# Patient Record
Sex: Female | Born: 1945 | Race: White | Hispanic: No | Marital: Married | Smoking: Never smoker
Health system: Southern US, Community
[De-identification: ages and names within clinical notes are randomized; demographics above are authoritative.]

## PROBLEM LIST (undated history)

## (undated) ENCOUNTER — Ambulatory Visit: Source: Home / Self Care

## (undated) DIAGNOSIS — Q249 Congenital malformation of heart, unspecified: Secondary | ICD-10-CM

## (undated) DIAGNOSIS — R002 Palpitations: Secondary | ICD-10-CM

## (undated) DIAGNOSIS — B019 Varicella without complication: Secondary | ICD-10-CM

## (undated) DIAGNOSIS — Z972 Presence of dental prosthetic device (complete) (partial): Secondary | ICD-10-CM

## (undated) DIAGNOSIS — Z8041 Family history of malignant neoplasm of ovary: Secondary | ICD-10-CM

## (undated) DIAGNOSIS — C50912 Malignant neoplasm of unspecified site of left female breast: Secondary | ICD-10-CM

## (undated) DIAGNOSIS — R0789 Other chest pain: Secondary | ICD-10-CM

## (undated) DIAGNOSIS — M62838 Other muscle spasm: Secondary | ICD-10-CM

## (undated) DIAGNOSIS — J209 Acute bronchitis, unspecified: Secondary | ICD-10-CM

## (undated) DIAGNOSIS — R079 Chest pain, unspecified: Secondary | ICD-10-CM

## (undated) DIAGNOSIS — E669 Obesity, unspecified: Secondary | ICD-10-CM

## (undated) DIAGNOSIS — K08109 Complete loss of teeth, unspecified cause, unspecified class: Secondary | ICD-10-CM

## (undated) DIAGNOSIS — Z973 Presence of spectacles and contact lenses: Secondary | ICD-10-CM

## (undated) DIAGNOSIS — H6980 Other specified disorders of Eustachian tube, unspecified ear: Secondary | ICD-10-CM

## (undated) DIAGNOSIS — E7800 Pure hypercholesterolemia, unspecified: Secondary | ICD-10-CM

## (undated) HISTORY — DX: Congenital malformation of heart, unspecified: Q24.9

## (undated) HISTORY — DX: Palpitations: R00.2

## (undated) HISTORY — DX: Other chest pain: R07.89

## (undated) HISTORY — DX: Varicella without complication: B01.9

## (undated) HISTORY — DX: Obesity, unspecified: E66.9

## (undated) HISTORY — PX: HEMORROIDECTOMY: SUR656

## (undated) HISTORY — DX: Pure hypercholesterolemia, unspecified: E78.00

## (undated) HISTORY — DX: Family history of malignant neoplasm of ovary: Z80.41

## (undated) HISTORY — PX: VAGINAL HYSTERECTOMY: SUR661

---

## 2004-12-23 HISTORY — PX: RETINAL DETACHMENT SURGERY: SHX105

## 2005-02-21 ENCOUNTER — Ambulatory Visit (HOSPITAL_COMMUNITY): Admission: RE | Admit: 2005-02-21 | Discharge: 2005-02-22 | Payer: Self-pay | Admitting: Ophthalmology

## 2009-07-25 ENCOUNTER — Encounter: Admission: RE | Admit: 2009-07-25 | Discharge: 2009-07-25 | Payer: Self-pay | Admitting: Cardiology

## 2011-05-10 NOTE — Op Note (Signed)
NAMEAMYRIA, Cassandra Maldonado            ACCOUNT NO.:  [id number]   MEDICAL RECORD NO.:  [id number]          PATIENT TYPE:  OIB   LOCATION:  2550                         FACILITY:  [facility]   PHYSICIAN:  Beulah Gandy. Ashley Royalty, M.D. DATE OF BIRTH:  [birth year]   DATE OF PROCEDURE:  02/21/2005  DATE OF DISCHARGE:                                 OPERATIVE REPORT   ADMISSION DIAGNOSES:  1.  Retinal break with fluid, left eye.  2.  Rhegmatogenous retinal detachment, right eye.   PROCEDURES:  1.  Retinal laser, left eye.  2.  Scleral buckle, right eye, with retinal laser, right eye.   SURGEON:  Alan Mulder, M.D.   ASSISTANT:  Rosalie Doctor, MA   ANESTHESIA:  General.   DETAILS:  After proper endotracheal anesthesia, attention was carried to the  left eye; 1113 burns were placed around the retinal periphery and around  multiple breaks in the left eye.  The power was between 400 and 800 mW, 1000  microns each and 0.1 seconds each.  Lubricant was placed in the eye; it was  patched shut and covered.  The attention was carried to the right eye, usual  prep and drape, 360 degrees limbal peritomy, isolation of 4 rectus muscles  on 2-0 silk, localization of detachment from 4 o'clock to 7 o'clock, scleral  dissection from 3 o'clock to 9 o'clock to admit a number 279 implant with 1  mm trimmed from the posterior edge, diathermy placed in the bed.  Four  sutures were placed in the scleral flaps, perforation site chosen at 6  o'clock.  A small amount of clear, thick, sticky subretinal fluid came  forth.  The 279 implant was placed against the globe and the scleral flaps  were closed, 240 band placed around the eye with a belt loop at 10, 11 and  2, a 270 sleeve at 2:30 o'clock.  The band was adjusted for a proper  indentation of the globe and the ends trimmed.  The buckle was adjusted and  trimmed.  The sutures were knotted and trimmed.  The conjunctiva was  reapproximated with 7-0 chromic suture.   Paracentesis x1 obtained a closing  tension of 10 with a Barraquer keratometer.  The conjunctiva was reposited  with 7-0 chromic suture.  Polymyxin and gentamicin were irrigated into  Tenon's space.  The indirect ophthalmoscope laser was moved into place; 422  burns were placed on the scleral buckle with a power 400 and 800 milliwatts,  1000 microns each and 0.1 seconds each.  Atropine solution was applied.  Decadron 10 mg was injected into the lower subconjunctival space.  Marcaine  was injected around the globe for postop pain. TobraDex ointment was placed,  there was no Polysporin, but a patch and shield.  The patient was awakened  and taken to recovery in satisfactory condition.     JDM/MEDQ  D:  02/21/2005  T:  02/22/2005  Job:  [id number]

## 2012-07-29 ENCOUNTER — Encounter: Payer: Self-pay | Admitting: Family Medicine

## 2012-07-29 ENCOUNTER — Ambulatory Visit (INDEPENDENT_AMBULATORY_CARE_PROVIDER_SITE_OTHER): Payer: Self-pay | Admitting: Family Medicine

## 2012-07-29 VITALS — BP 145/84 | HR 68 | Temp 98.2°F | Ht 65.0 in | Wt 251.2 lb

## 2012-07-29 DIAGNOSIS — M109 Gout, unspecified: Secondary | ICD-10-CM | POA: Insufficient documentation

## 2012-07-29 DIAGNOSIS — L309 Dermatitis, unspecified: Secondary | ICD-10-CM | POA: Insufficient documentation

## 2012-07-29 DIAGNOSIS — I10 Essential (primary) hypertension: Secondary | ICD-10-CM

## 2012-07-29 DIAGNOSIS — L259 Unspecified contact dermatitis, unspecified cause: Secondary | ICD-10-CM

## 2012-07-29 DIAGNOSIS — G5620 Lesion of ulnar nerve, unspecified upper limb: Secondary | ICD-10-CM | POA: Insufficient documentation

## 2012-07-29 DIAGNOSIS — R0982 Postnasal drip: Secondary | ICD-10-CM

## 2012-07-29 MED ORDER — TRIAMCINOLONE ACETONIDE 0.1 % EX OINT: TOPICAL_OINTMENT | Freq: Two times a day (BID) | CUTANEOUS | Status: DC

## 2012-07-29 MED ORDER — LISINOPRIL-HYDROCHLOROTHIAZIDE 10-12.5 MG PO TABS: 1.0000 | ORAL_TABLET | Freq: Every day | ORAL | Status: DC

## 2012-07-29 MED ORDER — MELOXICAM 15 MG PO TABS: 15.0000 mg | ORAL_TABLET | Freq: Every day | ORAL | Status: DC

## 2012-07-29 NOTE — Assessment & Plan Note (Signed)
New.  Strongly recommended ortho evaluation but pt not able to afford.  Will start daily NSAID to attempt to improve sxs and relieve inflammation.  Will follow.

## 2012-07-29 NOTE — Assessment & Plan Note (Signed)
New.  Pt's rash consistent w/ eczema.  Start steroid ointment prn.

## 2012-07-29 NOTE — Assessment & Plan Note (Signed)
New to provider, chronic for pt.  Previously well controlled on Lisinopril-HCT.  Restart med.  Will get labs at upcoming CPE when pt has insurance.  Reviewed supportive care and red flags that should prompt return.  Pt expressed understanding and is in agreement w/ plan.

## 2012-07-29 NOTE — Progress Notes (Signed)
  Subjective:    Patient ID: Cassandra Maldonado, female    DOB: 1945/12/30, 66 y.o.   MRN: [id number]  HPI New to establish.  Previous MD- An[facility].  HTN- chronic problem, previously on Lisinopril HCT w/ excellent BP control.  Pt has logs dating back to the first of the year.  Previous MD would not refill BP meds as of June and BP has been elevated since then.  At highest, BP was 181/94 and pt had HAs and blurry vision.  No CP, SOB, edema.  AM Cough- intermittent problem, rarely occurs during the day.  + productive.  Will occasionally 'cough until i vomit'.  Denies fevers, chills.  Gout- has required ER visit for this previously.  + family hx- brother on allopurinol.  Pt was previously on 100mg  BID and when she had flare in March, dose was upped to 300mg  BID.  Ran out of meds in July.  'i don't feel like i need it right now'.  R hand pain- mostly localized to middle finger.  Will have constant throbbing, seems more sensitive to cold.  Some numbness.  Some relief w/ ibuprofen.  'right now it almost feels numb'.  Some numbness of adjacent ring finger.  sxs started 2 weeks ago.  Pain will travel into wrist.  No elbow pain.  Skin lesion- medial L ankle, started as small red spot 'it looked like a mosquito bite'.  Had similar places on dorsum of L hand, anterior knees, and medial R ankle- which have all healed w/ use of bacitracin.   Review of Systems     Objective:   Physical Exam  Vitals reviewed. Constitutional: She is oriented to person, place, and time. She appears well-developed and well-nourished. No distress.  HENT:  Head: Normocephalic and atraumatic.  Right Ear: Tympanic membrane normal.  Left Ear: Tympanic membrane normal.  Nose: Mucosal edema and rhinorrhea present. Right sinus exhibits no maxillary sinus tenderness and no frontal sinus tenderness. Left sinus exhibits no maxillary sinus tenderness and no frontal sinus tenderness.  Mouth/Throat: Uvula is midline and mucous membranes are  normal. Posterior oropharyngeal erythema (w/ PND) present. No oropharyngeal exudate.  Eyes: Conjunctivae and EOM are normal. Pupils are equal, round, and reactive to light.  Neck: Normal range of motion. Neck supple. No thyromegaly present.  Cardiovascular: Normal rate, regular rhythm and normal heart sounds.   Pulmonary/Chest: Effort normal and breath sounds normal. No respiratory distress. She has no wheezes. She has no rales.  Abdominal: Soft. Bowel sounds are normal. She exhibits no distension. There is no tenderness. There is no rebound and no guarding.  Musculoskeletal: She exhibits edema (trace LE edema bilaterally).  Lymphadenopathy:    She has no cervical adenopathy.  Neurological: She is alert and oriented to person, place, and time. She has normal reflexes.  Skin: Skin is warm and dry. Rash (well circumscribed lesion on L medial ankle consistent w/ eczema) noted.          Assessment & Plan:

## 2012-07-29 NOTE — Assessment & Plan Note (Signed)
New.  This is cause of pt's AM cough.  Start OTC antihistamine.

## 2012-07-29 NOTE — Patient Instructions (Addendum)
Schedule your complete physical for April Restart the Lisinopril HCT daily Start the Mobic daily for pain and inflammation Use the triamcinolone ointment on the itchy spot twice daily Call with any questions or concerns Welcome!  We're glad to have you!!!

## 2012-07-29 NOTE — Assessment & Plan Note (Signed)
New to provider, chronic for pt.  Not currently on allopurinol.  Pt prefers to hold off at this time since she is sx free.  Will follow and treat prn.

## 2012-08-14 ENCOUNTER — Encounter: Payer: Self-pay | Admitting: Family Medicine

## 2012-08-14 DIAGNOSIS — M25519 Pain in unspecified shoulder: Secondary | ICD-10-CM

## 2012-08-14 NOTE — Telephone Encounter (Signed)
Placed ortho referral in chart, left vm on mobile per DPR and advised someone will call her with the apt date and time, also sent message via my chart

## 2012-08-14 NOTE — Telephone Encounter (Signed)
Please advise 

## 2012-08-17 ENCOUNTER — Encounter: Payer: Self-pay | Admitting: Family Medicine

## 2012-08-17 NOTE — Telephone Encounter (Signed)
Please review note from pt.

## 2012-08-18 NOTE — Telephone Encounter (Signed)
Please note and advise 

## 2013-02-06 ENCOUNTER — Other Ambulatory Visit: Payer: Self-pay

## 2013-03-29 ENCOUNTER — Encounter: Payer: Self-pay | Admitting: Family Medicine

## 2013-03-29 ENCOUNTER — Ambulatory Visit (INDEPENDENT_AMBULATORY_CARE_PROVIDER_SITE_OTHER): Payer: Medicare Other | Admitting: Family Medicine

## 2013-03-29 VITALS — BP 118/80 | HR 70 | Temp 98.1°F | Ht 65.25 in | Wt 250.8 lb

## 2013-03-29 DIAGNOSIS — M25551 Pain in right hip: Secondary | ICD-10-CM | POA: Insufficient documentation

## 2013-03-29 DIAGNOSIS — Z1231 Encounter for screening mammogram for malignant neoplasm of breast: Secondary | ICD-10-CM

## 2013-03-29 DIAGNOSIS — Z0000 Encounter for general adult medical examination without abnormal findings: Secondary | ICD-10-CM

## 2013-03-29 DIAGNOSIS — I10 Essential (primary) hypertension: Secondary | ICD-10-CM

## 2013-03-29 DIAGNOSIS — E2839 Other primary ovarian failure: Secondary | ICD-10-CM

## 2013-03-29 DIAGNOSIS — M109 Gout, unspecified: Secondary | ICD-10-CM

## 2013-03-29 DIAGNOSIS — M25559 Pain in unspecified hip: Secondary | ICD-10-CM

## 2013-03-29 DIAGNOSIS — Z1331 Encounter for screening for depression: Secondary | ICD-10-CM

## 2013-03-29 DIAGNOSIS — Z23 Encounter for immunization: Secondary | ICD-10-CM

## 2013-03-29 DIAGNOSIS — Z1211 Encounter for screening for malignant neoplasm of colon: Secondary | ICD-10-CM

## 2013-03-29 DIAGNOSIS — M25552 Pain in left hip: Secondary | ICD-10-CM

## 2013-03-29 DIAGNOSIS — R1310 Dysphagia, unspecified: Secondary | ICD-10-CM

## 2013-03-29 LAB — CBC WITH DIFFERENTIAL/PLATELET
Basophils Absolute: 0.1 10*3/uL (ref 0.0–0.1)
Eosinophils Relative: 3.1 % (ref 0.0–5.0)
HCT: 37.3 % (ref 36.0–46.0)
Hemoglobin: 12.5 g/dL (ref 12.0–15.0)
Lymphocytes Relative: 26.6 % (ref 12.0–46.0)
Lymphs Abs: 1.9 10*3/uL (ref 0.7–4.0)
MCHC: 33.4 g/dL (ref 30.0–36.0)
MCV: 88.7 fl (ref 78.0–100.0)
Monocytes Absolute: 0.5 10*3/uL (ref 0.1–1.0)
RBC: 4.21 Mil/uL (ref 3.87–5.11)

## 2013-03-29 LAB — LIPID PANEL
LDL Cholesterol: 122 mg/dL — ABNORMAL HIGH (ref 0–99)
VLDL: 33.6 mg/dL (ref 0.0–40.0)

## 2013-03-29 LAB — BASIC METABOLIC PANEL
BUN: 20 mg/dL (ref 6–23)
Calcium: 9.4 mg/dL (ref 8.4–10.5)
Creatinine, Ser: 1.3 mg/dL — ABNORMAL HIGH (ref 0.4–1.2)
GFR: 44.89 mL/min — ABNORMAL LOW (ref >60.00)
Glucose, Bld: 105 mg/dL — ABNORMAL HIGH (ref 70–99)
Potassium: 3.6 mEq/L (ref 3.5–5.1)
Sodium: 138 mEq/L (ref 135–145)

## 2013-03-29 LAB — HEPATIC FUNCTION PANEL
Alkaline Phosphatase: 92 U/L (ref 39–117)
Bilirubin, Direct: 0.1 mg/dL (ref 0.0–0.3)
Total Bilirubin: 0.7 mg/dL (ref 0.3–1.2)

## 2013-03-29 LAB — TSH: TSH: 0.83 u[IU]/mL (ref 0.35–5.50)

## 2013-03-29 MED ORDER — ALLOPURINOL 100 MG PO TABS: 100.0000 mg | ORAL_TABLET | Freq: Two times a day (BID) | ORAL | Status: DC

## 2013-03-29 MED ORDER — NAPROXEN 500 MG PO TABS: 500.0000 mg | ORAL_TABLET | Freq: Two times a day (BID) | ORAL | Status: DC

## 2013-03-29 NOTE — Assessment & Plan Note (Signed)
Chronic problem.  Pt started taking brother's Allopurinol.  Will decrease to 100mg  BID.  Reviewed supportive care and red flags that should prompt return.  Pt expressed understanding and is in agreement w/ plan.

## 2013-03-29 NOTE — Assessment & Plan Note (Signed)
Chronic problem.  Improved today.  Asymptomatic.  No changes.

## 2013-03-29 NOTE — Assessment & Plan Note (Signed)
New.  Start regular NSAIDs.  If no improvement, will need ortho referral.  Pt expressed understanding and is in agreement w/ plan.

## 2013-03-29 NOTE — Patient Instructions (Addendum)
Follow up in 6 months to recheck BP We'll notify you of your lab results and make any changes if needed Someone will call you with your mammo/bone density appts, GI appt Call and schedule your eye exam Start the Naproxen twice daily- take w/ food.  If the hip pain is no better, please call so we can send you to ortho Start the Allopurinol twice daily Call with any questions or concerns [place] Ouch!

## 2013-03-29 NOTE — Assessment & Plan Note (Signed)
Pt's PE WNL w/ exception of obesity.  Pt overdue on all health maintenance- discussed w/ pt, referrals made.  Strongly encouraged eye exam.  EKG done- see document for interpretation.  Check labs.  Anticipatory guidance provided.

## 2013-03-29 NOTE — Assessment & Plan Note (Signed)
New.  Pt reports difficulty swallowing and being able to feel food travel the length of her esophagus.  Will refer to Gi for complete evaluation and tx.  Pt expressed understanding and is in agreement w/ plan.

## 2013-03-29 NOTE — Progress Notes (Signed)
  Subjective:    Patient ID: Cassandra Maldonado, female    DOB: [birth year], 67 y.o.   MRN: [id number]  HPI Here today for CPE.  Risk Factors: HTN- chronic problem, well controlled on Lisinopril HCTZ.  No CP, SOB, HAs, visual changes, edema. Gout- chronic problem, strong family hx.  Restarted Allopurinol after flare in toe.  Now on 300mg  daily.  Would like to decrease med to 100mg  BID. Bilateral hip pain- was occuring intermittently, now nightly.  Painful to lie on.  Described as a burning sensation.  Some relief w/ tylenol.  Stopped taking Mobic in September. Physical Activity: no current exercise Fall Risk: low risk Depression: no current sxs Hearing: normal to conversational tones, whispered voice at 6 ft ADL's: independent Cognitive: normal linear thought process, memory and attention intact Home Safety: safe at home. Height, Weight, BMI, Visual Acuity: see vitals, vision corrected to 20/20 w/ glasses Counseling: no need for paps due to TAH, overdue on mammo, DEXA, colonoscopy (has never had any of these done) Labs Ordered: See A&P Care Plan: See A&P    Review of Systems Patient reports no vision/hearing changes, adenopathy,fever, weight change,  persistant/recurrent hoarseness, chest pain, palpitations, edema, persistant/recurrent cough, hemoptysis, dyspnea (rest/exertional/paroxysmal nocturnal), gastrointestinal bleeding (melena, rectal bleeding), abdominal pain, significant heartburn, bowel changes, GU symptoms (dysuria, hematuria, incontinence), Gyn symptoms (abnormal  bleeding, pain),  syncope, focal weakness, memory loss, numbness & tingling, skin/hair/nail changes, abnormal bruising or bleeding, anxiety, or depression.   + dysphagia    Objective:   Physical Exam  General Appearance:    Alert, cooperative, no distress, appears stated age, obese  Head:    Normocephalic, without obvious abnormality, atraumatic  Eyes:    PERRL, conjunctiva/corneas clear, EOM's intact, fundi   benign, both eyes  Ears:    Normal TM's and external ear canals, both ears  Nose:   Nares normal, septum midline, mucosa normal, no drainage    or sinus tenderness  Throat:   Lips, mucosa, and tongue normal; teeth and gums normal  Neck:   Supple, symmetrical, trachea midline, no adenopathy;    Thyroid: no enlargement/tenderness/nodules  Back:     Symmetric, no curvature, ROM normal, no CVA tenderness  Lungs:     Clear to auscultation bilaterally, respirations unlabored  Chest Wall:    No tenderness or deformity   Heart:    Regular rate and rhythm, S1 and S2 normal, no murmur, rub   or gallop  Breast Exam:    No tenderness, masses, or nipple abnormality  Abdomen:     Soft, non-tender, bowel sounds active all four quadrants,    no masses, no organomegaly  Genitalia:    Deferred due to TAH  Rectal:    Extremities:   Extremities normal, atraumatic, no cyanosis or edema  Pulses:   2+ and symmetric all extremities  Skin:   Skin color, texture, turgor normal, no rashes or lesions  Lymph nodes:   Cervical, supraclavicular, and axillary nodes normal  Neurologic:   CNII-XII intact, normal strength, sensation and reflexes    throughout          Assessment & Plan:

## 2013-03-30 ENCOUNTER — Encounter: Payer: Self-pay | Admitting: Gastroenterology

## 2013-04-16 ENCOUNTER — Encounter: Payer: Self-pay | Admitting: General Practice

## 2013-04-20 ENCOUNTER — Ambulatory Visit: Payer: Medicare Other | Admitting: Gastroenterology

## 2013-07-21 ENCOUNTER — Other Ambulatory Visit: Payer: Self-pay | Admitting: Family Medicine

## 2013-07-26 NOTE — Telephone Encounter (Signed)
Rx sent to the pharmacy by e-script.//AB/CMA 

## 2013-07-28 ENCOUNTER — Other Ambulatory Visit: Payer: Self-pay

## 2013-10-13 ENCOUNTER — Encounter: Payer: Self-pay | Admitting: Family Medicine

## 2013-10-13 ENCOUNTER — Ambulatory Visit (INDEPENDENT_AMBULATORY_CARE_PROVIDER_SITE_OTHER): Payer: Medicare Other | Admitting: Family Medicine

## 2013-10-13 VITALS — BP 116/68 | HR 73 | Temp 98.4°F | Wt 253.0 lb

## 2013-10-13 DIAGNOSIS — R102 Pelvic and perineal pain: Secondary | ICD-10-CM

## 2013-10-13 DIAGNOSIS — R350 Frequency of micturition: Secondary | ICD-10-CM

## 2013-10-13 DIAGNOSIS — N9489 Other specified conditions associated with female genital organs and menstrual cycle: Secondary | ICD-10-CM

## 2013-10-13 LAB — POCT URINALYSIS DIPSTICK
Glucose, UA: NEGATIVE
Ketones, UA: NEGATIVE
Protein, UA: NEGATIVE
pH, UA: 6

## 2013-10-13 MED ORDER — CIPROFLOXACIN HCL 250 MG PO TABS: 250.0000 mg | ORAL_TABLET | Freq: Two times a day (BID) | ORAL | Status: DC

## 2013-10-13 NOTE — Patient Instructions (Signed)
Urinary Tract Infection  Urinary tract infections (UTIs) can develop anywhere along your urinary tract. Your urinary tract is your body's drainage system for removing wastes and extra water. Your urinary tract includes two kidneys, two ureters, a bladder, and a urethra. Your kidneys are a pair of bean-shaped organs. Each kidney is about the size of your fist. They are located below your ribs, one on each side of your spine.  CAUSES  Infections are caused by microbes, which are microscopic organisms, including fungi, viruses, and bacteria. These organisms are so small that they can only be seen through a microscope. Bacteria are the microbes that most commonly cause UTIs.  SYMPTOMS   Symptoms of UTIs may vary by age and gender of the patient and by the location of the infection. Symptoms in young women typically include a frequent and intense urge to urinate and a painful, burning feeling in the bladder or urethra during urination. Older women and men are more likely to be tired, shaky, and weak and have muscle aches and abdominal pain. A fever may mean the infection is in your kidneys. Other symptoms of a kidney infection include pain in your back or sides below the ribs, nausea, and vomiting.  DIAGNOSIS  To diagnose a UTI, your caregiver will ask you about your symptoms. Your caregiver also will ask to provide a urine sample. The urine sample will be tested for bacteria and white blood cells. White blood cells are made by your body to help fight infection.  TREATMENT   Typically, UTIs can be treated with medication. Because most UTIs are caused by a bacterial infection, they usually can be treated with the use of antibiotics. The choice of antibiotic and length of treatment depend on your symptoms and the type of bacteria causing your infection.  HOME CARE INSTRUCTIONS   If you were prescribed antibiotics, take them exactly as your caregiver instructs you. Finish the medication even if you feel better after you  have only taken some of the medication.   Drink enough water and fluids to keep your urine clear or pale yellow.   Avoid caffeine, tea, and carbonated beverages. They tend to irritate your bladder.   Empty your bladder often. Avoid holding urine for long periods of time.   Empty your bladder before and after sexual intercourse.   After a bowel movement, women should cleanse from front to back. Use each tissue only once.  SEEK MEDICAL CARE IF:    You have back pain.   You develop a fever.   Your symptoms do not begin to resolve within 3 days.  SEEK IMMEDIATE MEDICAL CARE IF:    You have severe back pain or lower abdominal pain.   You develop chills.   You have nausea or vomiting.   You have continued burning or discomfort with urination.  MAKE SURE YOU:    Understand these instructions.   Will watch your condition.   Will get help right away if you are not doing well or get worse.  Document Released: 09/18/2005 Document Revised: 06/09/2012 Document Reviewed: 01/17/2012  [employer] Patient Information 2014 [employer].

## 2013-10-13 NOTE — Progress Notes (Signed)
  Subjective:    Cassandra Maldonado is a 67 y.o. female who complains of suprapubic pressure. She has had symptoms for 4 days. Patient also complains of chills. Patient denies back pain, congestion, cough, fever, headache, rhinitis, sorethroat and vaginal discharge. Patient does not have a history of recurrent UTI. Patient does not have a history of pyelonephritis.   The following portions of the patient's history were reviewed and updated as appropriate: allergies, current medications, past family history, past medical history, past social history, past surgical history and problem list.  Review of Systems Pertinent items are noted in HPI.    Objective:    BP 116/68  Pulse 73  Temp(Src) 98.4 F (36.9 C) (Oral)  Wt 253 lb (114.76 kg)  BMI 41.8 kg/m2  SpO2 97% General appearance: alert, cooperative, appears stated age and no distress Abdomen: soft, non-tender; bowel sounds normal; no masses,  no organomegaly  Laboratory:  Urine dipstick: trace for hemoglobin and 2+ for leukocyte esterase.   Micro exam: not done.    Assessment:    dysuria     Plan:    Medications: ciprofloxacin. Maintain adequate hydration. Follow up if symptoms not improving, and as needed.

## 2013-10-15 ENCOUNTER — Telehealth: Payer: Self-pay | Admitting: Family Medicine

## 2013-10-15 LAB — URINE CULTURE: Organism ID, Bacteria: NO GROWTH

## 2013-10-15 NOTE — Telephone Encounter (Signed)
See lab

## 2013-10-15 NOTE — Telephone Encounter (Signed)
Pattient saw Dr. Laury Axon on 10/13/13 and is calling about urine lab results. Please advise.

## 2013-10-18 NOTE — Telephone Encounter (Signed)
Labs have been released to mychart

## 2013-10-24 ENCOUNTER — Other Ambulatory Visit: Payer: Self-pay | Admitting: Family Medicine

## 2013-10-25 NOTE — Telephone Encounter (Signed)
Med filled.  

## 2013-10-28 ENCOUNTER — Other Ambulatory Visit: Payer: Self-pay

## 2013-11-24 ENCOUNTER — Other Ambulatory Visit: Payer: Self-pay | Admitting: Family Medicine

## 2013-11-24 NOTE — Telephone Encounter (Signed)
Pt needs an appt for further  refills 

## 2013-12-24 ENCOUNTER — Telehealth: Payer: Self-pay | Admitting: Family Medicine

## 2013-12-24 MED ORDER — ALLOPURINOL 100 MG PO TABS: ORAL_TABLET | ORAL | Status: DC

## 2013-12-24 NOTE — Telephone Encounter (Signed)
I filled this med for 90 days. However pt has not been seen by tabori since April of last year. Can she be scheduled for a follow up BP appt.

## 2013-12-24 NOTE — Telephone Encounter (Signed)
Patient is calling to request a refill on her allopurinol (ZYLOPRIM) 100 MG rx. She wants to know if a 90-day supply can be rx'd so that she does not have to call every 30 days. Please advise.

## 2014-01-12 ENCOUNTER — Other Ambulatory Visit: Payer: Self-pay | Admitting: Family Medicine

## 2014-01-12 NOTE — Telephone Encounter (Signed)
Med filled.  

## 2014-01-14 ENCOUNTER — Other Ambulatory Visit: Payer: Self-pay | Admitting: Family Medicine

## 2014-01-14 NOTE — Telephone Encounter (Signed)
Med filled, only #30 pt needs follow up appt for BP.

## 2014-02-23 ENCOUNTER — Telehealth: Payer: Self-pay | Admitting: Family Medicine

## 2014-02-23 ENCOUNTER — Ambulatory Visit (INDEPENDENT_AMBULATORY_CARE_PROVIDER_SITE_OTHER): Payer: Medicare Other | Admitting: Family Medicine

## 2014-02-23 ENCOUNTER — Encounter: Payer: Self-pay | Admitting: Family Medicine

## 2014-02-23 VITALS — BP 120/76 | HR 75 | Temp 98.2°F | Resp 16 | Wt 250.5 lb

## 2014-02-23 DIAGNOSIS — I10 Essential (primary) hypertension: Secondary | ICD-10-CM

## 2014-02-23 DIAGNOSIS — M6530 Trigger finger, unspecified finger: Secondary | ICD-10-CM | POA: Insufficient documentation

## 2014-02-23 LAB — BASIC METABOLIC PANEL
BUN: 20 mg/dL (ref 6–23)
CO2: 28 mEq/L (ref 19–32)
CREATININE: 1.2 mg/dL (ref 0.4–1.2)
Calcium: 9.3 mg/dL (ref 8.4–10.5)
Chloride: 102 mEq/L (ref 96–112)
GFR: 49.21 mL/min — ABNORMAL LOW (ref >60.00)
GLUCOSE: 77 mg/dL (ref 70–99)
Potassium: 3.8 mEq/L (ref 3.5–5.1)
Sodium: 138 mEq/L (ref 135–145)

## 2014-02-23 LAB — HEPATIC FUNCTION PANEL
ALK PHOS: 94 U/L (ref 39–117)
ALT: 23 U/L (ref 0–35)
AST: 25 U/L (ref 0–37)
Albumin: 3.7 g/dL (ref 3.5–5.2)
BILIRUBIN DIRECT: 0.1 mg/dL (ref 0.0–0.3)
TOTAL PROTEIN: 7.6 g/dL (ref 6.0–8.3)
Total Bilirubin: 0.8 mg/dL (ref 0.3–1.2)

## 2014-02-23 LAB — LIPID PANEL
Cholesterol: 171 mg/dL (ref 0–200)
HDL: 27.7 mg/dL — AB (ref >39.00)
LDL CALC: 110 mg/dL — AB (ref 0–99)
Total CHOL/HDL Ratio: 6
Triglycerides: 166 mg/dL — ABNORMAL HIGH (ref 0.0–149.0)
VLDL: 33.2 mg/dL (ref 0.0–40.0)

## 2014-02-23 LAB — TSH: TSH: 0.57 u[IU]/mL (ref 0.35–5.50)

## 2014-02-23 MED ORDER — NAPROXEN 500 MG PO TABS: 500.0000 mg | ORAL_TABLET | Freq: Two times a day (BID) | ORAL | Status: DC

## 2014-02-23 NOTE — Progress Notes (Signed)
Pre visit review using our clinic review tool, if applicable. No additional management support is needed unless otherwise documented below in the visit note. 

## 2014-02-23 NOTE — Assessment & Plan Note (Signed)
Chronic problem.  Tolerating med but having leg cramps.  Check BMP to ensure normal K+ and Cr.  Encouraged increased fluids.  Will follow.

## 2014-02-23 NOTE — Patient Instructions (Signed)
Schedule your complete physical in 4-6 months Start the Naproxen twice daily- take w/ food- for inflammation If no improvement in your hand, please call for a referral to a hand specialist We'll notify you of your lab results and make any changes if needed Call with any questions or concerns [place]!

## 2014-02-23 NOTE — Telephone Encounter (Signed)
Relevant patient education assigned to patient using Emmi. ° °

## 2014-02-23 NOTE — Assessment & Plan Note (Signed)
New.  Offered referral to hand specialist but pt declined.  Prefers to start NSAIDs and monitor for sxs improvement.  Will refer if no improvement on Naproxen.  Pt expressed understanding and is in agreement w/ plan.

## 2014-02-23 NOTE — Progress Notes (Signed)
   Subjective:    Patient ID: Cassandra Maldonado, female    DOB: 06-Sep-1946, 68 y.o.   MRN: [id number]  HPI HTN- chronic problem, on Lisinopril HCTZ daily.  Denies CP, SOB, HAs, visual changes, edema.  + leg cramps.  L ring finger trigger finger- sxs started 'months ago'.  Unable to make a fist.  Pain will wake her from sleep.  Interfering w/ grip  Review of Systems For ROS see HPI     Objective:   Physical Exam  Vitals reviewed. Constitutional: She is oriented to person, place, and time. She appears well-developed and well-nourished. No distress.  HENT:  Head: Normocephalic and atraumatic.  Eyes: Conjunctivae and EOM are normal. Pupils are equal, round, and reactive to light.  Neck: Normal range of motion. Neck supple. No thyromegaly present.  Cardiovascular: Normal rate, regular rhythm, normal heart sounds and intact distal pulses.   No murmur heard. Pulmonary/Chest: Effort normal and breath sounds normal. No respiratory distress.  Abdominal: Soft. She exhibits no distension. There is no tenderness.  Musculoskeletal: She exhibits tenderness ( TTP over L 4th metacarpal on palmar surface). She exhibits no edema.  Lymphadenopathy:    She has no cervical adenopathy.  Neurological: She is alert and oriented to person, place, and time.  Skin: Skin is warm and dry.  Psychiatric: She has a normal mood and affect. Her behavior is normal.          Assessment & Plan:

## 2014-02-24 ENCOUNTER — Other Ambulatory Visit: Payer: Self-pay | Admitting: Family Medicine

## 2014-02-24 NOTE — Telephone Encounter (Signed)
Med filled.  

## 2014-03-28 ENCOUNTER — Other Ambulatory Visit: Payer: Self-pay | Admitting: Family Medicine

## 2014-03-28 NOTE — Telephone Encounter (Signed)
Med filled.  

## 2014-06-01 ENCOUNTER — Encounter: Payer: Self-pay | Admitting: Internal Medicine

## 2014-06-01 ENCOUNTER — Ambulatory Visit (INDEPENDENT_AMBULATORY_CARE_PROVIDER_SITE_OTHER): Payer: Medicare Other | Admitting: Internal Medicine

## 2014-06-01 VITALS — BP 110/72 | HR 68 | Temp 98.2°F | Wt 249.0 lb

## 2014-06-01 DIAGNOSIS — M2550 Pain in unspecified joint: Secondary | ICD-10-CM

## 2014-06-01 DIAGNOSIS — L239 Allergic contact dermatitis, unspecified cause: Secondary | ICD-10-CM

## 2014-06-01 DIAGNOSIS — L259 Unspecified contact dermatitis, unspecified cause: Secondary | ICD-10-CM

## 2014-06-01 MED ORDER — FLUTICASONE PROPIONATE 0.005 % EX OINT: 1.0000 "application " | TOPICAL_OINTMENT | Freq: Three times a day (TID) | CUTANEOUS | Status: DC

## 2014-06-01 MED ORDER — PREDNISONE 10 MG PO TABS: 10.0000 mg | ORAL_TABLET | Freq: Every day | ORAL | Status: DC

## 2014-06-01 NOTE — Patient Instructions (Signed)
Apply the ointment 3 times a day x 10 days  Take benadryl as needed for itching If not improving within the next few days, start prednisone by mouth Call if no better within 10 days Avoid the new bracelet

## 2014-06-01 NOTE — Progress Notes (Signed)
Subjective:    Patient ID: Cassandra Maldonado, female    DOB: [birth year], 68 y.o.   MRN: [id number]  DOS:  06/01/2014 Type of  Visit: acute History: 3 weeks ago got a new bracelet, used on the L wrist, shortly After she developed itching and a  blistery rash. Switch that bracelet to the right wrist and shortly after started to itch there as well. Also complained about joint aches on and off in the knees and hands (that's why she got the bracelet).   ROS No fever or chills wrist itself is not swollen or red  Past Medical History  Diagnosis Date  . SOB (shortness of breath)   . Chest pressure   . Palpitations   . Hypertension   . Hypercholesterolemia   . Obesity   . Sleep apnea   . Chicken pox   . Cardiac arrhythmia due to congenital heart disease     Past Surgical History  Procedure Laterality Date  . Retinal detachment surgery  2006  . Vaginal hysterectomy    . Cesarean section  1980    History   Social History  . Marital Status: Single    Spouse Name: N/A    Number of Children: N/A  . Years of Education: N/A   Occupational History  . Not on file.   Social History Main Topics  . Smoking status: Never Smoker   . Smokeless tobacco: Not on file  . Alcohol Use: No  . Drug Use: No  . Sexual Activity: Not on file   Other Topics Concern  . Not on file   Social History Narrative  . No narrative on file        Medication List       This list is accurate as of: 06/01/14 11:59 PM.  Always use your most recent med list.               allopurinol 100 MG tablet  Commonly known as:  ZYLOPRIM  TAKE ONE TABLET BY MOUTH TWICE DAILY     aspirin 81 MG tablet  Take 81 mg by mouth daily.     fluticasone 0.005 % ointment  Commonly known as:  CUTIVATE  Apply 1 application topically 3 (three) times daily.     lisinopril-hydrochlorothiazide 10-12.5 MG per tablet  Commonly known as:  PRINZIDE,ZESTORETIC  Take 1 tablet by mouth daily.     naproxen 500 MG tablet   Commonly known as:  NAPROSYN  Take 1 tablet (500 mg total) by mouth 2 (two) times daily with a meal.     OMEGA-3 KRILL OIL PO  Take 1 capsule by mouth daily.     predniSONE 10 MG tablet  Commonly known as:  DELTASONE  Take 1 tablet (10 mg total) by mouth daily. 2 tabs a day x 5 days     PROBIOTIC DAILY PO  Take 1 capsule by mouth daily.     triamcinolone ointment 0.1 %  Commonly known as:  KENALOG  APPLY TOPICALLY TWICE DAILY           Objective:   Physical Exam  Musculoskeletal:       Arms:  BP 110/72  Pulse 68  Temp(Src) 98.2 F (36.8 C)  Wt 249 lb (112.946 kg)  SpO2 95%  General -- alert, well-developed, NAD.   Extremities-- no pretibial edema bilaterally ; Wrists and hands without synovitis Neurologic--  alert & oriented X3. Speech normal, gait appropriate for age, strength symmetric and appropriate for  age.  Psych-- Cognition and judgment appear intact. Cooperative with normal attention span and concentration. No anxious or depressed appearing.      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

## 2014-06-01 NOTE — Progress Notes (Signed)
Pre visit review using our clinic review tool, if applicable. No additional management support is needed unless otherwise documented below in the visit note. 

## 2014-09-01 ENCOUNTER — Other Ambulatory Visit: Payer: Self-pay | Admitting: Family Medicine

## 2014-09-01 NOTE — Telephone Encounter (Signed)
Med filled, letter mailed to pt to schedule appt.

## 2014-09-20 ENCOUNTER — Ambulatory Visit: Payer: Medicare Other | Admitting: Family Medicine

## 2014-10-04 ENCOUNTER — Ambulatory Visit (INDEPENDENT_AMBULATORY_CARE_PROVIDER_SITE_OTHER): Payer: Medicare Other | Admitting: Family Medicine

## 2014-10-04 ENCOUNTER — Encounter: Payer: Self-pay | Admitting: Family Medicine

## 2014-10-04 VITALS — BP 122/74 | HR 71 | Temp 98.1°F | Resp 16 | Wt 244.5 lb

## 2014-10-04 DIAGNOSIS — I10 Essential (primary) hypertension: Secondary | ICD-10-CM

## 2014-10-04 DIAGNOSIS — R079 Chest pain, unspecified: Secondary | ICD-10-CM

## 2014-10-04 HISTORY — DX: Chest pain, unspecified: R07.9

## 2014-10-04 LAB — CBC WITH DIFFERENTIAL/PLATELET
BASOS PCT: 1.1 % (ref 0.0–3.0)
Basophils Absolute: 0.1 10*3/uL (ref 0.0–0.1)
EOS ABS: 0.6 10*3/uL (ref 0.0–0.7)
Eosinophils Relative: 8.3 % — ABNORMAL HIGH (ref 0.0–5.0)
HCT: 36.8 % (ref 36.0–46.0)
Hemoglobin: 11.6 g/dL — ABNORMAL LOW (ref 12.0–15.0)
LYMPHS PCT: 34.4 % (ref 12.0–46.0)
Lymphs Abs: 2.4 10*3/uL (ref 0.7–4.0)
MCHC: 31.6 g/dL (ref 30.0–36.0)
MCV: 90.3 fl (ref 78.0–100.0)
Monocytes Absolute: 0.5 10*3/uL (ref 0.1–1.0)
Monocytes Relative: 6.6 % (ref 3.0–12.0)
NEUTROS PCT: 49.6 % (ref 43.0–77.0)
Neutro Abs: 3.4 10*3/uL (ref 1.4–7.7)
PLATELETS: 314 10*3/uL (ref 150.0–400.0)
RBC: 4.08 Mil/uL (ref 3.87–5.11)
RDW: 15.5 % (ref 11.5–15.5)
WBC: 6.9 10*3/uL (ref 4.0–10.5)

## 2014-10-04 LAB — BASIC METABOLIC PANEL
BUN: 22 mg/dL (ref 6–23)
CALCIUM: 9.5 mg/dL (ref 8.4–10.5)
CHLORIDE: 102 meq/L (ref 96–112)
CO2: 24 mEq/L (ref 19–32)
CREATININE: 1.3 mg/dL — AB (ref 0.4–1.2)
GFR: 44.28 mL/min — ABNORMAL LOW (ref >60.00)
Glucose, Bld: 96 mg/dL (ref 70–99)
POTASSIUM: 3.9 meq/L (ref 3.5–5.1)
SODIUM: 136 meq/L (ref 135–145)

## 2014-10-04 LAB — TSH: TSH: 1.08 u[IU]/mL (ref 0.35–4.50)

## 2014-10-04 MED ORDER — ALLOPURINOL 100 MG PO TABS: ORAL_TABLET | ORAL | Status: DC

## 2014-10-04 MED ORDER — LISINOPRIL-HYDROCHLOROTHIAZIDE 10-12.5 MG PO TABS: ORAL_TABLET | ORAL | Status: DC

## 2014-10-04 NOTE — Assessment & Plan Note (Signed)
Chronic problem.  Excellent control.  Asymptomatic today but has had intermittent CP.  Check labs.  No anticipated med changes.

## 2014-10-04 NOTE — Assessment & Plan Note (Signed)
New.  Pt currently asymptomatic but having intermittent L chest pain radiating to shoulder.  EKG unchanged from previous w/ exception of T wave inversions in V3.  Pt reports hx of normal stress test.  Will refer to cards for evaluation.  Reviewed supportive care and red flags that should prompt return.  Pt expressed understanding and is in agreement w/ plan.

## 2014-10-04 NOTE — Progress Notes (Signed)
   Subjective:    Patient ID: Cassandra Maldonado, female    DOB: [birth year], 68 y.o.   MRN: [id number]  HPI HTN- chronic problem, excellent control on Lisinopril HCTZ.  Home BPs ranging 107-129/65-78.  Has lost 5 lbs.  Denies SOB, HAs, visual changes, edema.  CP- pt reports intermittent sx of chest tightness radiating across L chest and into shoulder.  Last occurred ~2 weeks ago.  No associated SOB, N/V, diaphoresis.  Resolves spontaneously.  Pain will last 30-40 minutes.  Doesn't occur w/ strenuous activity.  Not consistent w/ heartburn.   Review of Systems For ROS see HPI     Objective:   Physical Exam  Vitals reviewed. Constitutional: She is oriented to person, place, and time. She appears well-developed and well-nourished. No distress.  HENT:  Head: Normocephalic and atraumatic.  Eyes: Conjunctivae and EOM are normal. Pupils are equal, round, and reactive to light.  Neck: Normal range of motion. Neck supple. No thyromegaly present.  Cardiovascular: Normal rate, regular rhythm, normal heart sounds and intact distal pulses.   No murmur heard. Pulmonary/Chest: Effort normal and breath sounds normal. No respiratory distress.  Abdominal: Soft. She exhibits no distension. There is no tenderness.  Musculoskeletal: She exhibits no edema.  Lymphadenopathy:    She has no cervical adenopathy.  Neurological: She is alert and oriented to person, place, and time.  Skin: Skin is warm and dry.  Psychiatric: She has a normal mood and affect. Her behavior is normal.          Assessment & Plan:

## 2014-10-04 NOTE — Progress Notes (Signed)
Pre visit review using our clinic review tool, if applicable. No additional management support is needed unless otherwise documented below in the visit note. 

## 2014-10-04 NOTE — Patient Instructions (Signed)
Schedule your complete physical in 6 months We'll call you with your cardiology appt We'll notify you of your lab results and make any changes if needed Call with any questions or concerns Keep up the good work!

## 2014-10-21 ENCOUNTER — Encounter: Payer: Self-pay | Admitting: Family Medicine

## 2014-10-21 ENCOUNTER — Ambulatory Visit (INDEPENDENT_AMBULATORY_CARE_PROVIDER_SITE_OTHER): Payer: Medicare Other | Admitting: Family Medicine

## 2014-10-21 VITALS — BP 130/78 | HR 64 | Temp 97.8°F | Resp 16 | Wt 247.0 lb

## 2014-10-21 DIAGNOSIS — M25512 Pain in left shoulder: Secondary | ICD-10-CM

## 2014-10-21 DIAGNOSIS — H6980 Other specified disorders of Eustachian tube, unspecified ear: Secondary | ICD-10-CM

## 2014-10-21 DIAGNOSIS — M62838 Other muscle spasm: Secondary | ICD-10-CM

## 2014-10-21 DIAGNOSIS — H6990 Unspecified Eustachian tube disorder, unspecified ear: Secondary | ICD-10-CM | POA: Insufficient documentation

## 2014-10-21 DIAGNOSIS — M6248 Contracture of muscle, other site: Secondary | ICD-10-CM

## 2014-10-21 DIAGNOSIS — H6983 Other specified disorders of Eustachian tube, bilateral: Secondary | ICD-10-CM

## 2014-10-21 HISTORY — DX: Unspecified eustachian tube disorder, unspecified ear: H69.90

## 2014-10-21 HISTORY — DX: Other specified disorders of Eustachian tube, unspecified ear: H69.80

## 2014-10-21 HISTORY — DX: Other muscle spasm: M62.838

## 2014-10-21 MED ORDER — CYCLOBENZAPRINE HCL 10 MG PO TABS: 10.0000 mg | ORAL_TABLET | Freq: Three times a day (TID) | ORAL | Status: DC | PRN

## 2014-10-21 MED ORDER — MELOXICAM 15 MG PO TABS: 15.0000 mg | ORAL_TABLET | Freq: Every day | ORAL | Status: DC

## 2014-10-21 NOTE — Patient Instructions (Signed)
Follow up as needed Start the Mobic daily x5-7 days and then as needed for pain/inflammation.  Take w/ food Use the flexeril prior to bed to improve muscle spasm HEAT! We'll call you with your orthopedic appt The ear pain is due to allergy congestion- start Claritin or Zyrtec daily Call with any questions or concerns Hang in there!!!

## 2014-10-21 NOTE — Progress Notes (Signed)
Pre visit review using our clinic review tool, if applicable. No additional management support is needed unless otherwise documented below in the visit note. 

## 2014-10-21 NOTE — Progress Notes (Signed)
   Subjective:    Patient ID: Cassandra Maldonado, female    DOB: Oct 21, 1946, 68 y.o.   MRN: [id number]  Headache  Associated symptoms include ear pain.  Otalgia  Associated symptoms include headaches.  Sore Throat  Associated symptoms include ear pain and headaches.   Neck pain- pt reports bilateral 'sharp ear pains'.  Now w/ neck soreness, painful to turn head to R b/c pain radiates down L side of neck and into shoulder.  Pain woke pt from sleep.  Difficulty w/ overhead motion on L.  No weakness or numbness of L arm.   Review of Systems  HENT: Positive for ear pain.   Neurological: Positive for headaches.   For ROS see HPI     Objective:   Physical Exam  Vitals reviewed. Constitutional: She is oriented to person, place, and time. She appears well-developed and well-nourished. No distress.  HENT:  Head: Normocephalic and atraumatic.  Right Ear: Tympanic membrane normal.  Left Ear: Tympanic membrane normal.  Nose: Mucosal edema and rhinorrhea present. Right sinus exhibits no maxillary sinus tenderness and no frontal sinus tenderness. Left sinus exhibits no maxillary sinus tenderness and no frontal sinus tenderness.  Mouth/Throat: Mucous membranes are normal. Posterior oropharyngeal erythema (w/ PND) present.  Eyes: Conjunctivae and EOM are normal. Pupils are equal, round, and reactive to light.  Neck: Normal range of motion. Neck supple.  + trap spasm, L>R  Cardiovascular: Normal rate, regular rhythm and normal heart sounds.   Pulmonary/Chest: Effort normal and breath sounds normal. No respiratory distress. She has no wheezes. She has no rales.  Musculoskeletal: She exhibits tenderness (over L trap).  Difficulty w/ overhead motion on L in both shoulder abduction and forward flexion + impingement signs  Lymphadenopathy:    She has no cervical adenopathy.  Neurological: She is alert and oriented to person, place, and time. She has normal reflexes.          Assessment & Plan:

## 2014-10-23 NOTE — Assessment & Plan Note (Signed)
New.  Suspect this is the cause of pt's ear pain.  Start allergy medication to decrease nasal congestion.  Reviewed supportive care and red flags that should prompt return.  Pt expressed understanding and is in agreement w/ plan.

## 2014-10-23 NOTE — Assessment & Plan Note (Signed)
New.  Start scheduled NSAIDs, flexeril prn.  Reviewed supportive care and red flags that should prompt return.  Pt expressed understanding and is in agreement w/ plan.

## 2014-10-23 NOTE — Assessment & Plan Note (Signed)
New.  Due to limitations in overhead motion and + impingement signs, pt needs evaluation by ortho.  Start scheduled NSAIDs.  Will follow.

## 2014-11-28 ENCOUNTER — Ambulatory Visit: Payer: Medicare Other | Admitting: Cardiology

## 2015-02-22 ENCOUNTER — Other Ambulatory Visit: Payer: Self-pay | Admitting: Family Medicine

## 2015-02-22 ENCOUNTER — Ambulatory Visit (INDEPENDENT_AMBULATORY_CARE_PROVIDER_SITE_OTHER): Payer: Medicare Other | Admitting: Family Medicine

## 2015-02-22 ENCOUNTER — Encounter: Payer: Self-pay | Admitting: Family Medicine

## 2015-02-22 VITALS — BP 118/70 | HR 68 | Temp 98.2°F | Resp 16 | Wt 240.5 lb

## 2015-02-22 DIAGNOSIS — N6325 Unspecified lump in the left breast, overlapping quadrants: Secondary | ICD-10-CM | POA: Insufficient documentation

## 2015-02-22 DIAGNOSIS — N6320 Unspecified lump in the left breast, unspecified quadrant: Principal | ICD-10-CM

## 2015-02-22 DIAGNOSIS — N63 Unspecified lump in breast: Secondary | ICD-10-CM

## 2015-02-22 NOTE — Progress Notes (Signed)
   Subjective:    Patient ID: Cassandra Maldonado, female    DOB: 17-Sep-1946, 69 y.o.   MRN: [id number]  HPI Breast lump- L breast, 1st noticed 6 weeks ago.  Some discomfort.  Pt has never had a mammo.  No skin changes- redness, drainage.   Review of Systems For ROS see HPI     Objective:   Physical Exam  Constitutional: She appears well-developed and well-nourished. No distress.  Pulmonary/Chest: Right breast exhibits no inverted nipple, no mass, no nipple discharge, no skin change and no tenderness. Left breast exhibits mass and tenderness. Left breast exhibits no inverted nipple, no nipple discharge and no skin change.    Vitals reviewed.         Assessment & Plan:

## 2015-02-22 NOTE — Progress Notes (Signed)
Pre visit review using our clinic review tool, if applicable. No additional management support is needed unless otherwise documented below in the visit note. 

## 2015-02-22 NOTE — Patient Instructions (Signed)
Follow up as needed We'll call you with your imaging appt Ibuprofen/Mobic for breast pain Call with any questions or concerns Hang in there!

## 2015-02-22 NOTE — Assessment & Plan Note (Signed)
New mass of L breast at 12 o'clock position.  + TTP.  Pt has never had mammogram.  Pt aware that she will need imaging to determine what this is.  Order entered for diagnostic mammo.  Will follow closely.

## 2015-02-24 ENCOUNTER — Other Ambulatory Visit: Payer: Self-pay | Admitting: Family Medicine

## 2015-02-24 ENCOUNTER — Ambulatory Visit
Admission: RE | Admit: 2015-02-24 | Discharge: 2015-02-24 | Disposition: A | Discharge status: Home / Self Care | Payer: Medicare Other | Source: Ambulatory Visit | Attending: Family Medicine | Admitting: Family Medicine

## 2015-02-24 DIAGNOSIS — N6325 Unspecified lump in the left breast, overlapping quadrants: Secondary | ICD-10-CM

## 2015-02-24 DIAGNOSIS — N6320 Unspecified lump in the left breast, unspecified quadrant: Secondary | ICD-10-CM

## 2015-02-28 ENCOUNTER — Other Ambulatory Visit: Payer: Self-pay | Admitting: Family Medicine

## 2015-02-28 DIAGNOSIS — N6320 Unspecified lump in the left breast, unspecified quadrant: Secondary | ICD-10-CM

## 2015-03-02 ENCOUNTER — Other Ambulatory Visit: Payer: Self-pay | Admitting: Family Medicine

## 2015-03-02 ENCOUNTER — Ambulatory Visit
Admission: RE | Admit: 2015-03-02 | Discharge: 2015-03-02 | Disposition: A | Discharge status: Home / Self Care | Payer: Medicare Other | Source: Ambulatory Visit | Attending: Family Medicine | Admitting: Family Medicine

## 2015-03-02 DIAGNOSIS — N6320 Unspecified lump in the left breast, unspecified quadrant: Secondary | ICD-10-CM

## 2015-03-03 ENCOUNTER — Other Ambulatory Visit: Payer: Self-pay | Admitting: Family Medicine

## 2015-03-03 ENCOUNTER — Ambulatory Visit
Admission: RE | Admit: 2015-03-03 | Discharge: 2015-03-03 | Disposition: A | Discharge status: Home / Self Care | Payer: Medicare Other | Source: Ambulatory Visit | Attending: Family Medicine | Admitting: Family Medicine

## 2015-03-03 ENCOUNTER — Encounter: Payer: Self-pay | Admitting: Family Medicine

## 2015-03-03 DIAGNOSIS — R509 Fever, unspecified: Secondary | ICD-10-CM

## 2015-03-03 DIAGNOSIS — N6320 Unspecified lump in the left breast, unspecified quadrant: Secondary | ICD-10-CM

## 2015-03-03 DIAGNOSIS — R059 Cough, unspecified: Secondary | ICD-10-CM

## 2015-03-03 DIAGNOSIS — R05 Cough: Secondary | ICD-10-CM

## 2015-03-03 DIAGNOSIS — C50912 Malignant neoplasm of unspecified site of left female breast: Secondary | ICD-10-CM

## 2015-03-06 ENCOUNTER — Ambulatory Visit (INDEPENDENT_AMBULATORY_CARE_PROVIDER_SITE_OTHER): Payer: Self-pay | Admitting: Surgery

## 2015-03-06 ENCOUNTER — Other Ambulatory Visit (INDEPENDENT_AMBULATORY_CARE_PROVIDER_SITE_OTHER): Payer: Self-pay | Admitting: Surgery

## 2015-03-06 DIAGNOSIS — C50912 Malignant neoplasm of unspecified site of left female breast: Secondary | ICD-10-CM

## 2015-03-08 ENCOUNTER — Ambulatory Visit
Admission: RE | Admit: 2015-03-08 | Discharge: 2015-03-08 | Disposition: A | Discharge status: Home / Self Care | Payer: Medicare Other | Source: Ambulatory Visit | Attending: Family Medicine | Admitting: Family Medicine

## 2015-03-08 DIAGNOSIS — C50912 Malignant neoplasm of unspecified site of left female breast: Secondary | ICD-10-CM

## 2015-03-08 MED ORDER — GADOBENATE DIMEGLUMINE 529 MG/ML IV SOLN: 10.0000 mL | Freq: Once | INTRAVENOUS | Status: AC | PRN

## 2015-03-09 ENCOUNTER — Telehealth: Payer: Self-pay | Admitting: *Deleted

## 2015-03-09 NOTE — Telephone Encounter (Signed)
Pt returned my call and I confirmed 03/13/15 med onc and genetic appt w/ pt.  Unable to mail before appt letter - gave verbal.  Unable to mail welcoming packet - gave directions and instructions.  Unable to mail intake form - placed a note for one to be given to pt at time of check in.  Emailed Engineer, civil (consulting) at [employer] to make her aware. Placed note in Dr. Geralyn Flash box and took one to HIM to scan.

## 2015-03-09 NOTE — Telephone Encounter (Signed)
Received referral from [facility].  Called and left a message for the pt to return my call so I can schedule her for med onc and genetic appts.

## 2015-03-13 ENCOUNTER — Ambulatory Visit: Payer: Self-pay | Admitting: Surgery

## 2015-03-13 ENCOUNTER — Encounter: Payer: Self-pay | Admitting: Hematology and Oncology

## 2015-03-13 ENCOUNTER — Other Ambulatory Visit: Payer: Medicare Other

## 2015-03-13 ENCOUNTER — Encounter: Payer: Self-pay | Admitting: Medical Oncology

## 2015-03-13 ENCOUNTER — Telehealth: Payer: Self-pay | Admitting: Hematology and Oncology

## 2015-03-13 ENCOUNTER — Ambulatory Visit (HOSPITAL_BASED_OUTPATIENT_CLINIC_OR_DEPARTMENT_OTHER): Payer: Medicare Other | Admitting: Hematology and Oncology

## 2015-03-13 ENCOUNTER — Ambulatory Visit: Payer: Medicare Other

## 2015-03-13 ENCOUNTER — Encounter: Payer: Self-pay | Admitting: Genetic Counselor

## 2015-03-13 ENCOUNTER — Encounter: Payer: Self-pay | Admitting: *Deleted

## 2015-03-13 ENCOUNTER — Encounter: Payer: Self-pay | Admitting: Radiation Oncology

## 2015-03-13 ENCOUNTER — Ambulatory Visit (HOSPITAL_BASED_OUTPATIENT_CLINIC_OR_DEPARTMENT_OTHER): Payer: Medicare Other | Admitting: Genetic Counselor

## 2015-03-13 VITALS — BP 135/62 | HR 79 | Temp 98.4°F | Ht 65.0 in | Wt 242.5 lb

## 2015-03-13 DIAGNOSIS — C773 Secondary and unspecified malignant neoplasm of axilla and upper limb lymph nodes: Secondary | ICD-10-CM

## 2015-03-13 DIAGNOSIS — Z315 Encounter for genetic counseling: Secondary | ICD-10-CM

## 2015-03-13 DIAGNOSIS — C50412 Malignant neoplasm of upper-outer quadrant of left female breast: Secondary | ICD-10-CM | POA: Insufficient documentation

## 2015-03-13 DIAGNOSIS — Z8041 Family history of malignant neoplasm of ovary: Secondary | ICD-10-CM | POA: Insufficient documentation

## 2015-03-13 NOTE — Telephone Encounter (Signed)
Spoke with patient and she is aware of her echo appointment °

## 2015-03-13 NOTE — Progress Notes (Signed)
REFERRING PROVIDER: Midge Minium, MD [address] [place], [place] [address]   Nicholas Lose, MD  PRIMARY PROVIDER:  Annye Asa, MD  PRIMARY REASON FOR VISIT:  1. Breast cancer of upper-outer quadrant of left female breast   2. Family history of ovarian cancer      HISTORY OF PRESENT ILLNESS:   Ms. Wojtas, a 69 y.o. female, was seen for a [facility] cancer genetics consultation at the request of Dr. Lindi Adie due to a personal and family history of cancer.  Ms. Brummond presents to clinic today to discuss the possibility of a hereditary predisposition to cancer, genetic testing, and to further clarify her future cancer risks, as well as potential cancer risks for family members.   In 2016, at the age of 21, Ms. Alvelo was diagnosed with invasive ductal carcinoma of the left breast.  The tumor is essentially triple negative with ER+ weakly positive, PR-, Her2-. This will be treated with chemotherapy and possibly a lumpectomy and radiation.  This was found by Ms. Rosengren, and was her first mammogram.  She reports that she does not go to the doctor, and therefore has never had a mammogram prior to this one, never had a colonoscopy, and has not had a pelvic exam since around the birth of her second child 31 years ago.     CANCER HISTORY:    Breast cancer of upper-outer quadrant of left female breast   03/02/2015 Initial Diagnosis Left breast biopsy 1:00: Invasive mammary cancer, grade 3, 1 lymph node biopsy in the axilla positive, ER 12%, PR 0%, Ki-67 100%, HER-2 negative ratio 1.53   03/08/2015 Breast MRI Left breast 1:00 position: 3.6 cm mass with enlarged axillary lymph nodes multiple levels 1213 left axillary lymph nodes largest 4 cm, total chondroitin nor mass measures 10.2 cm     HORMONAL RISK FACTORS:  Menarche was at age 68.  First live birth at age 89.  OCP use for approximately 10 years.  Ovaries intact: yes.  Hysterectomy: yes.  Menopausal status:  postmenopausal.  HRT use: 0 years. Colonoscopy: no; not examined. Mammogram within the last year: yes. Number of breast biopsies: 1. Up to date with pelvic exams:  no. Any excessive radiation exposure in the past:  no  Past Medical History  Diagnosis Date  . SOB (shortness of breath)   . Chest pressure   . Palpitations   . Hypertension   . Hypercholesterolemia   . Obesity   . Sleep apnea   . Chicken pox   . 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 Breast cancer, left breast   . Family history of ovarian cancer     Past Surgical History  Procedure Laterality Date  . Retinal detachment surgery  2006  . Vaginal hysterectomy    . Cesarean section  1980    History   Social History  . Marital Status: Married    Spouse Name: N/A  . Number of Children: 2  . Years of Education: N/A   Social History Main Topics  . Smoking status: Never Smoker   . Smokeless tobacco: Not on file  . Alcohol Use: No  . Drug Use: No  . Sexual Activity: Not on file   Other Topics Concern  . None   Social History Narrative     FAMILY HISTORY:  We obtained a detailed, 4-generation family history.  Significant diagnoses are listed below: Family History  Problem Relation Age of Onset  .  Diabetes Brother   . Arthritis Maternal Grandmother   . Lymphoma Mother     dx in her late 71s  . Liver cancer Father 50  . Ovarian cancer Sister 56    primary peritoneal cancer   Ms. Huron has two healthy brothers and one sister who died of ovarian vs. Peritoneal cancer.  Her mother was diagnosed with lymphoma at 32 and her father with liver cancer at 64.  No other reported cancers in her family. Patient's maternal ancestors are of Caucasian descent, and paternal ancestors are of Caucasian descent. There is no reported Ashkenazi Jewish ancestry. There is no known consanguinity.  GENETIC COUNSELING ASSESSMENT: Vinette Crites is a 69 y.o. female with a personal and family history of cancer  which somewhat suggestive of a hereditary breast cancer syndrome and predisposition to cancer. We, therefore, discussed and recommended the following at today's visit.   DISCUSSION: We reviewed the characteristics, features and inheritance patterns of hereditary cancer syndromes. We discussed common hereditary conditions such as BRCA mutations, and other causes of ovarian cancer including Lynch and other genes.  We also discussed genetic testing, including the appropriate family members to test, the process of testing, insurance coverage and turn-around-time for results. We discussed the implications of a negative, positive and/or variant of uncertain significant result. We recommended Ms. Hilyard pursue genetic testing for the hereditary breast/ovarian cancer gene panel. The Breast/Ovarian gene panel offered by [facility] includes sequencing and rearrangement analysis for the following 21 genes:  ATM, BARD1, BRCA1, BRCA2, BRIP1, CDH1, CHEK2, EPCAM, FANCC, MLH1, MSH2, MSH6, NBN, PALB2, PMS2, PTEN, RAD51C, RAD51D, STK11, TP53, and XRCC2.     PLAN: After considering the risks, benefits, and limitations, Ms. Eischen  provided informed consent to pursue genetic testing and the blood sample was sent to [facility] for analysis of the Breast/Ovarian Cancer gene panel. Results should be available within approximately 3-4 weeks' time, at which point they will be disclosed by telephone to Ms. Duhe, as will any additional recommendations warranted by these results. Ms. Malatesta will receive a summary of her genetic counseling visit and a copy of her results once available. This information will also be available in Epic. We encouraged Ms. Rufer to remain in contact with cancer genetics annually so that we can continuously update the family history and inform her of any changes in cancer genetics and testing that may be of benefit for her family. Ms. Kitzmiller questions were answered to her satisfaction today. Our  contact information was provided should additional questions or concerns arise.  Lastly, we encouraged Ms. Viall to remain in contact with cancer genetics annually so that we can continuously update the family history and inform her of any changes in cancer genetics and testing that may be of benefit for this family.   Ms.  Winiecki questions were answered to her satisfaction today. Our contact information was provided should additional questions or concerns arise. Thank you for the referral and allowing [place] to share in the care of your patient.   Karen P. Florene Glen, [place], [facility] Certified Genetic Counselor [email or web address] phone: [phone]  The patient was seen for a total of 45 minutes in face-to-face genetic counseling.  This patient was discussed with Drs. Magrinat, Lindi Adie and/or Burr Medico who agrees with the above.    _______________________________________________________________________ For Office Staff:  Number of people involved in session: 2 Was an Intern/ student involved with case: yes

## 2015-03-13 NOTE — Addendum Note (Signed)
Addended by: Prentiss Bells on: 03/13/2015 06:16 PM   Modules accepted: Orders

## 2015-03-13 NOTE — Assessment & Plan Note (Signed)
Left breast invasive mammary cancer 1:00 position: 3.1 x 2.9 x 3.6 cm tumor with multiple enlarged level MCCXIII left axillary lymph nodes largest measuring 4 cm per total congruent nor mass measures 10.2 cm ER 12%, PR 0%, Ki-67 100%, HER-2 negative ratio 1.53  Pathology and radiology review: I discussed the pathology and radiology reports. Patient has an extremely large conglomerate tumor in the axilla in addition to a 3.6 cm mass in the breast. Biopsy revealed invasive mammary cancer with very low ER positivity. Ki-67 at 100% suggests an aggressive cancer. I discussed the significance of ER/PR and HER-2/neu receptors and their implications to treatment decisions. I explained the difference between in situ cancer and invasive breast cancer.  Recommendation: 1. Staging evaluation with CT chest abdomen and pelvis and bone scan 2. No evidence of distant metastatic disease, patient would be treated with neoadjuvant chemotherapy with dose dense Adriamycin and Cytoxan followed by Abraxane weekly 12. 3. Patient will need port placement, echocardiogram and chemotherapy education.  Chemotherapy counseling:I have discussed the risks and benefits of chemotherapy including the risks of nausea/ vomiting, risk of infection from low WBC count, fatigue due to chemo or anemia, bruising or bleeding due to low platelets, mouth sores, loss/ change in taste and decreased appetite. Liver and kidney function will be monitored through out chemotherapy as abnormalities in liver and kidney function may be a side effect of treatment. Cardiac dysfunction due to Adriamycin was discussed in detail. Risk of permanent bone marrow dysfunction and leukemia due to chemo were also discussed.  Return to clinic after staging scans to discuss final treatment plan.

## 2015-03-13 NOTE — Progress Notes (Signed)
[facility] CONSULT NOTE  Patient Care Team: Midge Minium, MD as PCP - General (Family Medicine)  CHIEF COMPLAINTS/PURPOSE OF CONSULTATION:  Newly diagnosed breast cancer  HISTORY OF PRESENTING ILLNESS:  Cassandra Maldonado 69 y.o. female is here because of recent diagnosis of Left breast cancer. She felt a mass 3 months ago and did not think much about it until recently, it caused her pain and saw her PCP who ordered mammogrmas and Ultrasound which revealed breast mass along with large axillary lymph nodes, biopsy of the mass and lymph nodes were positive for invasive mammary cancer that was Er pos and PR Neg and Her 2 Neg. Ki 67 of 100%. She had an MRI breast last week and is here to discuss the plan.  I reviewed her records extensively and collaborated the history with the patient.  SUMMARY OF ONCOLOGIC HISTORY:   Breast cancer of upper-outer quadrant of left female breast   03/02/2015 Initial Diagnosis Left breast biopsy 1:00: Invasive mammary cancer, grade 3, 1 lymph node biopsy in the axilla positive, ER 12%, PR 0%, Ki-67 100%, HER-2 negative ratio 1.53   03/08/2015 Breast MRI Left breast 1:00 position: 3.6 cm mass with enlarged axillary lymph nodes multiple levels 1213 left axillary lymph nodes largest 4 cm, total chondroitin nor mass measures 10.2 cm   MEDICAL HISTORY:  Past Medical History  Diagnosis Date  . SOB (shortness of breath)   . Chest pressure   . Palpitations   . Hypertension   . Hypercholesterolemia   . Obesity   . Sleep apnea   . Chicken pox   . 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 Breast cancer, left breast     SURGICAL HISTORY: Past Surgical History  Procedure Laterality Date  . Retinal detachment surgery  2006  . Vaginal hysterectomy    . Cesarean section  1980    SOCIAL HISTORY: History   Social History  . Marital Status: Married    Spouse Name: N/A  . Number of Children: N/A  . Years of Education: N/A    Occupational History  . Not on file.   Social History Main Topics  . Smoking status: Never Smoker   . Smokeless tobacco: Not on file  . Alcohol Use: No  . Drug Use: No  . Sexual Activity: Not on file   Other Topics Concern  . Not on file   Social History Narrative    FAMILY HISTORY: Family History  Problem Relation Age of Onset  . Diabetes Brother   . Arthritis Maternal Grandmother     ALLERGIES:  has No Known Allergies.  MEDICATIONS:  Current Outpatient Prescriptions  Medication Sig Dispense Refill  . allopurinol (ZYLOPRIM) 100 MG tablet TAKE ONE TABLET BY MOUTH TWICE DAILY 180 tablet 1  . lisinopril-hydrochlorothiazide (PRINZIDE,ZESTORETIC) 10-12.5 MG per tablet TAKE ONE TABLET BY MOUTH ONCE DAILY 90 tablet 1  . aspirin 81 MG tablet Take 81 mg by mouth daily.    . fluticasone (CUTIVATE) 0.005 % ointment Apply 1 application topically 3 (three) times daily. (Patient not taking: Reported on 03/13/2015) 30 g 0  . Homeopathic Products (LEG CRAMP RELIEF) TABS Take 2 tablets by mouth daily as needed.    . Probiotic Product (PROBIOTIC DAILY PO) Take 1 capsule by mouth daily.     No current facility-administered medications for this visit.    REVIEW OF SYSTEMS:   Constitutional: Denies fevers, chills or abnormal night sweats Eyes: Denies blurriness of vision, double  vision or watery eyes Ears, nose, mouth, throat, and face: Denies mucositis or sore throat Respiratory: Denies cough, dyspnea or wheezes Cardiovascular: Denies palpitation, chest discomfort or lower extremity swelling Gastrointestinal:  Denies nausea, heartburn or change in bowel habits Skin: Denies abnormal skin rashes Lymphatics: Denies new lymphadenopathy or easy bruising Neurological:Denies numbness, tingling or new weaknesses Behavioral/Psych: Mood is stable, no new changes  Breast: Breast mass and axillary LN mets All other systems were reviewed with the patient and are negative.  PHYSICAL  EXAMINATION: ECOG PERFORMANCE STATUS: 0 - Asymptomatic  Filed Vitals:   03/13/15 1256  BP: 135/62  Pulse: 79  Temp: 98.4 F (36.9 C)   Filed Weights   03/13/15 1256  Weight: 242 lb 8 oz (109.997 kg)    GENERAL:alert, no distress and comfortable SKIN: skin color, texture, turgor are normal, no rashes or significant lesions EYES: normal, conjunctiva are pink and non-injected, sclera clear OROPHARYNX:no exudate, no erythema and lips, buccal mucosa, and tongue normal  NECK: supple, thyroid normal size, non-tender, without nodularity LYMPH:  no palpable lymphadenopathy in the cervical, axillary or inguinal LUNGS: clear to auscultation and percussion with normal breathing effort HEART: regular rate & rhythm and no murmurs and no lower extremity edema ABDOMEN:abdomen soft, non-tender and normal bowel sounds Musculoskeletal:no cyanosis of digits and no clubbing  PSYCH: alert & oriented x 3 with fluent speech NEURO: no focal motor/sensory deficits BREAST:Large mass palpable in left breast and large group of axillary LN palpable. (exam performed in the presence of a chaperone)   LABORATORY DATA:  I have reviewed the data as listed Lab Results  Component Value Date   WBC 6.9 10/04/2014   HGB 11.6* 10/04/2014   HCT 36.8 10/04/2014   MCV 90.3 10/04/2014   PLT 314.0 10/04/2014   Lab Results  Component Value Date   NA 136 10/04/2014   K 3.9 10/04/2014   CL 102 10/04/2014   CO2 24 10/04/2014    RADIOGRAPHIC STUDIES: I have personally reviewed the radiological reports and agreed with the findings in the report. Results are summarized as above  ASSESSMENT AND PLAN:  Breast cancer of upper-outer quadrant of left female breast Left breast invasive mammary cancer 1:00 position: 3.1 x 2.9 x 3.6 cm tumor with multiple enlarged level MCCXIII left axillary lymph nodes largest measuring 4 cm per total congruent nor mass measures 10.2 cm ER 12%, PR 0%, Ki-67 100%, HER-2 negative ratio  1.53  Pathology and radiology review: I discussed the pathology and radiology reports. Patient has an extremely large conglomerate tumor in the axilla in addition to a 3.6 cm mass in the breast. Biopsy revealed invasive mammary cancer with very low ER positivity. Ki-67 at 100% suggests an aggressive cancer. I discussed the significance of ER/PR and HER-2/neu receptors and their implications to treatment decisions. I explained the difference between in situ cancer and invasive breast cancer.  Recommendation: 1. Staging evaluation with CT chest abdomen and pelvis and bone scan 2. If no evidence of distant metastatic disease, patient would be treated with neoadjuvant chemotherapy with dose dense Adriamycin and Cytoxan followed by Abraxane weekly 12. 3. Patient will need port placement, echocardiogram and chemotherapy education.  Chemotherapy counseling:I have discussed the risks and benefits of chemotherapy including the risks of nausea/ vomiting, risk of infection from low WBC count, fatigue due to chemo or anemia, bruising or bleeding due to low platelets, mouth sores, loss/ change in taste and decreased appetite. Liver and kidney function will be monitored through out chemotherapy  as abnormalities in liver and kidney function may be a side effect of treatment. Cardiac dysfunction due to Adriamycin was discussed in detail. Risk of permanent bone marrow dysfunction and leukemia due to chemo were also discussed.  Discussed with patient regarding PREVENT trial PREVENT trial: [id number] [phone]  Newly diagnosed breast cancer Stages 1-3 scheduled to receive anthracycline randomized to atorvastatin 40 mg or placebo once daily for 24 months along with 3 cardiac MRIs or 2 years paid for by the trial. I discussed the risks and benefits of atorvastatin including the risk of myopathy and elevation of liver function tests. I explained the randomization process and patient is interested in the trial. I provided her with  literature to read and provided her with the contact with the clinical trials nurse to ask any questions regarding the trial.   Return to clinic after staging scans to discuss final treatment plan.   All questions were answered. The patient knows to call the clinic with any problems, questions or concerns.    Rulon Eisenmenger, MD 2:06 PM

## 2015-03-13 NOTE — Progress Notes (Signed)
New pt intake form - chart updated.  Sent to scan.  MD note created during OV sent to scan.

## 2015-03-13 NOTE — Progress Notes (Signed)
Spoke with patient today at her new patient visit with Dr. Lindi Adie.  Navigation resources given.  Encouraged her to call with any needs or concerns.  Patient verbalized understanding.

## 2015-03-14 ENCOUNTER — Telehealth: Payer: Self-pay

## 2015-03-14 ENCOUNTER — Encounter (HOSPITAL_BASED_OUTPATIENT_CLINIC_OR_DEPARTMENT_OTHER): Payer: Self-pay | Admitting: *Deleted

## 2015-03-14 ENCOUNTER — Encounter (HOSPITAL_BASED_OUTPATIENT_CLINIC_OR_DEPARTMENT_OTHER)
Admission: RE | Admit: 2015-03-14 | Discharge: 2015-03-14 | Disposition: A | Discharge status: Home / Self Care | Payer: Medicare Other | Source: Ambulatory Visit | Attending: General Surgery | Admitting: General Surgery

## 2015-03-14 ENCOUNTER — Ambulatory Visit (HOSPITAL_COMMUNITY)
Admission: RE | Admit: 2015-03-14 | Discharge: 2015-03-14 | Disposition: A | Discharge status: Home / Self Care | Payer: Medicare Other | Source: Ambulatory Visit | Attending: Hematology and Oncology | Admitting: Hematology and Oncology

## 2015-03-14 DIAGNOSIS — Z7982 Long term (current) use of aspirin: Secondary | ICD-10-CM | POA: Diagnosis not present

## 2015-03-14 DIAGNOSIS — Z79899 Other long term (current) drug therapy: Secondary | ICD-10-CM | POA: Diagnosis not present

## 2015-03-14 DIAGNOSIS — C50912 Malignant neoplasm of unspecified site of left female breast: Secondary | ICD-10-CM | POA: Diagnosis not present

## 2015-03-14 DIAGNOSIS — I10 Essential (primary) hypertension: Secondary | ICD-10-CM | POA: Diagnosis not present

## 2015-03-14 DIAGNOSIS — R599 Enlarged lymph nodes, unspecified: Secondary | ICD-10-CM | POA: Diagnosis not present

## 2015-03-14 DIAGNOSIS — R079 Chest pain, unspecified: Secondary | ICD-10-CM | POA: Insufficient documentation

## 2015-03-14 DIAGNOSIS — I700 Atherosclerosis of aorta: Secondary | ICD-10-CM | POA: Diagnosis not present

## 2015-03-14 DIAGNOSIS — I2510 Atherosclerotic heart disease of native coronary artery without angina pectoris: Secondary | ICD-10-CM | POA: Diagnosis not present

## 2015-03-14 DIAGNOSIS — Z452 Encounter for adjustment and management of vascular access device: Secondary | ICD-10-CM | POA: Diagnosis not present

## 2015-03-14 DIAGNOSIS — C50412 Malignant neoplasm of upper-outer quadrant of left female breast: Secondary | ICD-10-CM

## 2015-03-14 LAB — BASIC METABOLIC PANEL
ANION GAP: 8 (ref 5–15)
BUN: 19 mg/dL (ref 6–23)
CALCIUM: 9.6 mg/dL (ref 8.4–10.5)
CO2: 26 mmol/L (ref 19–32)
Chloride: 102 mmol/L (ref 96–112)
Creatinine, Ser: 1.18 mg/dL — ABNORMAL HIGH (ref 0.50–1.10)
GFR calc non Af Amer: 47 mL/min — ABNORMAL LOW (ref >90)
GFR, EST AFRICAN AMERICAN: 54 mL/min — AB (ref >90)
Glucose, Bld: 94 mg/dL (ref 70–99)
Potassium: 4.9 mmol/L (ref 3.5–5.1)
Sodium: 136 mmol/L (ref 135–145)

## 2015-03-14 NOTE — Progress Notes (Signed)
   03/14/15 1133  OBSTRUCTIVE SLEEP APNEA  Have you ever been diagnosed with sleep apnea through a sleep study? No  Do you snore loudly (loud enough to be heard through closed doors)?  1  Do you often feel tired, fatigued, or sleepy during the daytime? 0  Has anyone observed you stop breathing during your sleep? 0  Do you have, or are you being treated for high blood pressure? 1  BMI more than 35 kg/m2? 1  Age over 67 years old? 1  Gender: 0

## 2015-03-14 NOTE — Progress Notes (Signed)
  Echocardiogram 2D Echocardiogram has been performed.  Cassandra Maldonado 03/14/2015, 10:07 AM

## 2015-03-14 NOTE — Progress Notes (Signed)
Coming in for bmet-just had echo for chemo-denies sleep apnea

## 2015-03-14 NOTE — Telephone Encounter (Signed)
Let pt know d/t appt and instructions for scan.  Pt voiced understanding.

## 2015-03-14 NOTE — Telephone Encounter (Signed)
LMOVM for Cassandra Maldonado - Cassandra Maldonado is in her echo at this time.  Requested he call back to clinic for schedule for scans.  Request triage transfer to desk nurse

## 2015-03-16 ENCOUNTER — Encounter (HOSPITAL_BASED_OUTPATIENT_CLINIC_OR_DEPARTMENT_OTHER)
Admission: RE | Disposition: A | Discharge status: Home / Self Care | Payer: Self-pay | Source: Ambulatory Visit | Attending: General Surgery

## 2015-03-16 ENCOUNTER — Encounter (HOSPITAL_BASED_OUTPATIENT_CLINIC_OR_DEPARTMENT_OTHER): Payer: Self-pay | Admitting: Anesthesiology

## 2015-03-16 ENCOUNTER — Ambulatory Visit (HOSPITAL_COMMUNITY)
Admission: RE | Admit: 2015-03-16 | Discharge: 2015-03-17 | Disposition: A | Discharge status: Home / Self Care | Payer: Medicare Other | Source: Ambulatory Visit | Attending: General Surgery | Admitting: General Surgery

## 2015-03-16 ENCOUNTER — Ambulatory Visit (HOSPITAL_BASED_OUTPATIENT_CLINIC_OR_DEPARTMENT_OTHER): Payer: Medicare Other | Admitting: Anesthesiology

## 2015-03-16 ENCOUNTER — Ambulatory Visit (HOSPITAL_COMMUNITY): Payer: Medicare Other

## 2015-03-16 DIAGNOSIS — Z90710 Acquired absence of both cervix and uterus: Secondary | ICD-10-CM

## 2015-03-16 DIAGNOSIS — R599 Enlarged lymph nodes, unspecified: Secondary | ICD-10-CM | POA: Diagnosis not present

## 2015-03-16 DIAGNOSIS — Z452 Encounter for adjustment and management of vascular access device: Secondary | ICD-10-CM | POA: Diagnosis not present

## 2015-03-16 DIAGNOSIS — I10 Essential (primary) hypertension: Secondary | ICD-10-CM

## 2015-03-16 DIAGNOSIS — I700 Atherosclerosis of aorta: Secondary | ICD-10-CM | POA: Diagnosis not present

## 2015-03-16 DIAGNOSIS — C50912 Malignant neoplasm of unspecified site of left female breast: Secondary | ICD-10-CM

## 2015-03-16 DIAGNOSIS — Z95828 Presence of other vascular implants and grafts: Secondary | ICD-10-CM

## 2015-03-16 HISTORY — PX: PORTACATH PLACEMENT: SHX2246

## 2015-03-16 HISTORY — DX: Complete loss of teeth, unspecified cause, unspecified class: K08.109

## 2015-03-16 HISTORY — DX: Presence of dental prosthetic device (complete) (partial): Z97.2

## 2015-03-16 HISTORY — DX: Presence of spectacles and contact lenses: Z97.3

## 2015-03-16 LAB — POCT HEMOGLOBIN-HEMACUE: Hemoglobin: 11.3 g/dL — ABNORMAL LOW (ref 12.0–15.0)

## 2015-03-16 SURGERY — INSERTION, TUNNELED CENTRAL VENOUS DEVICE, WITH PORT
Anesthesia: General

## 2015-03-16 MED ORDER — MIDAZOLAM HCL 5 MG/5ML IJ SOLN
INTRAMUSCULAR | Status: DC | PRN
  Administered 2015-03-16: 2 mg via INTRAVENOUS

## 2015-03-16 MED ORDER — OXYCODONE-ACETAMINOPHEN 10-325 MG PO TABS: 1.0000 | ORAL_TABLET | Freq: Four times a day (QID) | ORAL | Status: DC | PRN

## 2015-03-16 MED ORDER — BUPIVACAINE HCL (PF) 0.25 % IJ SOLN
INTRAMUSCULAR | Status: DC | PRN
  Administered 2015-03-16: 8 mL

## 2015-03-16 MED ORDER — FENTANYL CITRATE 0.05 MG/ML IJ SOLN
INTRAMUSCULAR | Status: AC
  Filled 2015-03-16: qty 4

## 2015-03-16 MED ORDER — LIDOCAINE HCL (CARDIAC) 20 MG/ML IV SOLN
INTRAVENOUS | Status: DC | PRN
  Administered 2015-03-16: 50 mg via INTRAVENOUS

## 2015-03-16 MED ORDER — HEPARIN SOD (PORK) LOCK FLUSH 100 UNIT/ML IV SOLN
INTRAVENOUS | Status: DC | PRN
  Administered 2015-03-16: 500 [IU] via INTRAVENOUS

## 2015-03-16 MED ORDER — HEPARIN (PORCINE) IN NACL 2-0.9 UNIT/ML-% IJ SOLN
INTRAMUSCULAR | Status: AC
  Filled 2015-03-16: qty 500

## 2015-03-16 MED ORDER — CEFAZOLIN SODIUM-DEXTROSE 2-3 GM-% IV SOLR
INTRAVENOUS | Status: AC
  Filled 2015-03-16: qty 50

## 2015-03-16 MED ORDER — FENTANYL CITRATE 0.05 MG/ML IJ SOLN: 50.0000 ug | INTRAMUSCULAR | Status: DC | PRN

## 2015-03-16 MED ORDER — MIDAZOLAM HCL 2 MG/2ML IJ SOLN
INTRAMUSCULAR | Status: AC
  Filled 2015-03-16: qty 2

## 2015-03-16 MED ORDER — CHLORHEXIDINE GLUCONATE 4 % EX LIQD: 1.0000 "application " | Freq: Once | CUTANEOUS | Status: DC

## 2015-03-16 MED ORDER — LACTATED RINGERS IV SOLN
INTRAVENOUS | Status: DC
  Administered 2015-03-16 (×2): via INTRAVENOUS

## 2015-03-16 MED ORDER — FENTANYL CITRATE 0.05 MG/ML IJ SOLN
INTRAMUSCULAR | Status: DC | PRN
  Administered 2015-03-16: 100 ug via INTRAVENOUS

## 2015-03-16 MED ORDER — MIDAZOLAM HCL 2 MG/2ML IJ SOLN
1.0000 mg | INTRAMUSCULAR | Status: DC | PRN
Start: 2015-03-16 — End: 2015-03-16

## 2015-03-16 MED ORDER — BUPIVACAINE HCL (PF) 0.25 % IJ SOLN
INTRAMUSCULAR | Status: AC
  Filled 2015-03-16: qty 30

## 2015-03-16 MED ORDER — ONDANSETRON HCL 4 MG/2ML IJ SOLN
INTRAMUSCULAR | Status: DC | PRN
  Administered 2015-03-16: 4 mg via INTRAVENOUS

## 2015-03-16 MED ORDER — CEFAZOLIN SODIUM-DEXTROSE 2-3 GM-% IV SOLR
2.0000 g | INTRAVENOUS | Status: AC
  Administered 2015-03-16: 2 g via INTRAVENOUS

## 2015-03-16 MED ORDER — DEXAMETHASONE SODIUM PHOSPHATE 4 MG/ML IJ SOLN
INTRAMUSCULAR | Status: DC | PRN
  Administered 2015-03-16: 10 mg via INTRAVENOUS

## 2015-03-16 MED ORDER — HEPARIN (PORCINE) IN NACL 2-0.9 UNIT/ML-% IJ SOLN
INTRAMUSCULAR | Status: DC | PRN
  Administered 2015-03-16: 1 via INTRAVENOUS

## 2015-03-16 MED ORDER — HEPARIN SOD (PORK) LOCK FLUSH 100 UNIT/ML IV SOLN
INTRAVENOUS | Status: AC
  Filled 2015-03-16: qty 5

## 2015-03-16 MED ORDER — PROPOFOL 10 MG/ML IV BOLUS
INTRAVENOUS | Status: DC | PRN
  Administered 2015-03-16: 200 mg via INTRAVENOUS

## 2015-03-16 SURGICAL SUPPLY — 47 items
BAG DECANTER FOR FLEXI CONT (MISCELLANEOUS) ×2 IMPLANT
BENZOIN TINCTURE PRP APPL 2/3 (GAUZE/BANDAGES/DRESSINGS) ×2 IMPLANT
BLADE SURG 11 STRL SS (BLADE) ×2 IMPLANT
BLADE SURG 15 STRL LF DISP TIS (BLADE) ×1 IMPLANT
BLADE SURG 15 STRL SS (BLADE) ×1
CANISTER SUCT 1200ML W/VALVE (MISCELLANEOUS) IMPLANT
CHLORAPREP W/TINT 26ML (MISCELLANEOUS) ×2 IMPLANT
COVER BACK TABLE 60X90IN (DRAPES) ×2 IMPLANT
COVER MAYO STAND STRL (DRAPES) ×2 IMPLANT
DECANTER SPIKE VIAL GLASS SM (MISCELLANEOUS) IMPLANT
DRAPE C-ARM 42X72 X-RAY (DRAPES) ×2 IMPLANT
DRAPE LAPAROSCOPIC ABDOMINAL (DRAPES) ×2 IMPLANT
DRAPE UTILITY XL STRL (DRAPES) ×2 IMPLANT
DRSG TEGADERM 4X4.75 (GAUZE/BANDAGES/DRESSINGS) IMPLANT
ELECT COATED BLADE 2.86 ST (ELECTRODE) ×2 IMPLANT
ELECT REM PT RETURN 9FT ADLT (ELECTROSURGICAL) ×2
ELECTRODE REM PT RTRN 9FT ADLT (ELECTROSURGICAL) ×1 IMPLANT
GLOVE BIO SURGEON STRL SZ7 (GLOVE) ×2 IMPLANT
GLOVE BIOGEL PI IND STRL 7.5 (GLOVE) ×1 IMPLANT
GLOVE BIOGEL PI INDICATOR 7.5 (GLOVE) ×1
GOWN STRL REUS W/ TWL LRG LVL3 (GOWN DISPOSABLE) ×4 IMPLANT
GOWN STRL REUS W/TWL LRG LVL3 (GOWN DISPOSABLE) ×4
IV KIT MINILOC 20X1 SAFETY (NEEDLE) IMPLANT
KIT PORT POWER ISP 8FR (Catheter) ×2 IMPLANT
LIQUID BAND (GAUZE/BANDAGES/DRESSINGS) ×2 IMPLANT
MARKER SKIN DUAL TIP RULER LAB (MISCELLANEOUS) ×2 IMPLANT
NDL SAFETY ECLIPSE 18X1.5 (NEEDLE) IMPLANT
NEEDLE HYPO 18GX1.5 SHARP (NEEDLE)
NEEDLE HYPO 25X1 1.5 SAFETY (NEEDLE) ×2 IMPLANT
PACK BASIN DAY SURGERY FS (CUSTOM PROCEDURE TRAY) ×2 IMPLANT
PENCIL BUTTON HOLSTER BLD 10FT (ELECTRODE) ×2 IMPLANT
SLEEVE SCD COMPRESS KNEE MED (MISCELLANEOUS) ×2 IMPLANT
SPONGE GAUZE 4X4 12PLY STER LF (GAUZE/BANDAGES/DRESSINGS) ×2 IMPLANT
STAPLER VISISTAT 35W (STAPLE) ×2 IMPLANT
STRIP CLOSURE SKIN 1/2X4 (GAUZE/BANDAGES/DRESSINGS) ×2 IMPLANT
SUT MON AB 4-0 PC3 18 (SUTURE) ×2 IMPLANT
SUT PROLENE 2 0 SH DA (SUTURE) ×2 IMPLANT
SUT SILK 2 0 TIES 17X18 (SUTURE)
SUT SILK 2-0 18XBRD TIE BLK (SUTURE) IMPLANT
SUT VIC AB 3-0 SH 27 (SUTURE) ×1
SUT VIC AB 3-0 SH 27X BRD (SUTURE) ×1 IMPLANT
SYR 5ML LUER SLIP (SYRINGE) ×2 IMPLANT
SYR CONTROL 10ML LL (SYRINGE) ×2 IMPLANT
TOWEL OR 17X24 6PK STRL BLUE (TOWEL DISPOSABLE) ×2 IMPLANT
TOWEL OR NON WOVEN STRL DISP B (DISPOSABLE) ×2 IMPLANT
TUBE CONNECTING 20X1/4 (TUBING) IMPLANT
YANKAUER SUCT BULB TIP NO VENT (SUCTIONS) IMPLANT

## 2015-03-16 NOTE — Op Note (Signed)
Preoperative diagnosis: Breast cancer, needs venous access Postoperative diagnosis: same as above Procedure: Right subclavian PowerPort insertion Surgeon: Dr. Serita Grammes Anesthesia: general with lma Estimated blood loss: Minimal Drains: None Counts gauge: None Sponge count was correct Disposition to recovery stable  Indications: This a 75 yof scheduled to begin primary chemotherapy for breast cancer. We discussed port placement.  Procedure: After informed consent was obtained the patient was taken to the operating room. She was given cefazolin. SCDs were placed. She was placed under general anesthesia. Arms were tucked and appropriately padded. She was then prepped and draped in a standard sterile surgical fashion. A surgical timeout was then performed.  I infiltrated Marcaine throughout the right chest wall as well as onto the clavicle. I accessed her subclavian vein on the first pass. The wire was placed. I then made a pocket overlying the pectoralis fascia. I then tunneled the line between the 2 sites. I then dilated the tract. I then inserted the peel-away sheath and dilator assembly and watched this go into position under fluoroscopy. I removed the wire assembly. I then placed the line. The peel-away sheath was removed. The line was pulled back to be in the distal cava. The port was then attached. This was secured with 2-0 Prolene in 2 places. I then flushed this and aspirated blood. I then closed this with 3-0 Vicryl and 4-0 Monocryl.This aspirated blood and I placed heparin in the port. Dermabond was placed over the wound. She tolerated this well as expected and transferred to recovery stable.

## 2015-03-16 NOTE — H&P (Signed)
pt sent at the request of Dr Birdie Riddle and Owens Shark for left breast mass present for 3 months. sometimes its painful location left breast upper outer quadrant and increasing in size. mammogram with U/s AND BX SHOWS IDC AND POSITIVE AXILLARY LN. MOTHER HAD NHL AND SISTER HAD PPC.  CLINICAL DATA: Patient presents for evaluation of left breast palpable mass. EXAM: DIGITAL DIAGNOSTIC LEFT MAMMOGRAM WITH CAD ULTRASOUND LEFT BREAST COMPARISON: None ACR Breast Density Category b: There are scattered areas of fibroglandular density. FINDINGS: Within the 1 o'clock position left breast there is a 3.7 cm irregular mass. There are multiple enlarged left axillary lymph nodes. No suspicious abnormality identified within the right breast. Mammographic images were processed with CAD. On physical exam, I palpate a firm mass within the superior left breast. Targeted ultrasound is performed, showing a 2.9 x 2.6 x 3.2 cm irregular lobular hypoechoic mass within the left breast 1 o'clock position 4 cm from the nipple. Multiple markedly enlarged left axillary lymph nodes are demonstrated with cortical thickening measuring up to 2 cm. IMPRESSION: Suspicious left breast mass. Enlarged cortically thickened left axillary lymph nodes. RECOMMENDATION: Ultrasound-guided core needle biopsy left breast mass and enlarged cortically thickened left axillary lymph node. Procedure scheduled for 03/02/2015 at 8:30 a.m. I have discussed the findings and recommendations with the patient. Results were also provided in writing at the conclusion of the visit. If applicable, a reminder letter will be sent to the patient regarding the next appointment. BI-RADS CATEGORY 5: Highly suggestive of malignancy. Electronically Signed By: Lovey Newcomer M.D. On: 02/24/2015 15:29    CLINICAL DATA: Mass in the 1 o'clock location of the left breast.  EXAM: ULTRASOUND GUIDED LEFT BREAST CORE NEEDLE BIOPSY  COMPARISON:  Previous exam(s).  PROCEDURE: I met with the patient and we discussed the procedure of ultrasound-guided biopsy, including benefits and alternatives. We discussed the high likelihood of a successful procedure. We discussed the risks of the procedure including infection, bleeding, tissue injury, clip migration, and inadequate sampling. Informed written consent was given. The usual time-out protocol was performed immediately prior to the procedure.  Using sterile technique and 2% Lidocaine as local anesthetic, under direct ultrasound visualization, a 12 gauge vacuum-assisted device was used to perform biopsy of mass in the 1 o'clock location of the left breastusing a lateral approach. At the conclusion of the procedure, a ribbon shaped tissue marker clip was deployed into the biopsy cavity. Follow-up 2-view mammogram was performed and dictated separately.  IMPRESSION: Ultrasound-guided biopsy of left breast mass. No apparent complications.   Electronically Signed By: Nolon Nations M.D. On: 03/02/2015 09:26    1. Breast, left, needle core biopsy, 1 o'clock, UOQ - INVASIVE MAMMARY CARCINOMA, SEE COMMENT. 2. 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 Microscopic Comment 1. Although the grade of tumor is best assessed at resection, with these biopsies, the invasive tumor is grade III. Breast prognostic studies are pending and will be reported in an addendum. The case was reviewed with Dr. Lyndon Code who concurs. Mali RUND DO Pathologist, Electronic Signature (Case signed 03/03/2015) Specimen Gross and Clinical Information Specimen Comment 1. In formalin 8:50 extracted < 5 min; palpable mass and palpable adenopathy Specimen(s) Obtained: 1. Breast, left, needle core biopsy, 1 o'clock, UOQ 2. Lymph node, needle/core biopsy, left axillary Specimen Clinical     CLINICAL DATA: Status post ultrasound-guided  core biopsy of mass in the 1 o'clock location of the left breast.  EXAM: DIAGNOSTIC LEFT MAMMOGRAM POST  ULTRASOUND BIOPSY  COMPARISON: Previous exam(s).  FINDINGS: Mammographic images were obtained following ultrasound guided biopsy of mass in the 1 o'clock location of the left breast. A ribbon shaped clip is identified in the upper-outer quadrant of the left breast as expected.  IMPRESSION: Tissue marker clip is in expected location following biopsy.  Final Assessment: Post Procedure Mammograms for Marker Placement   Electronically Signed By: Nolon Nations M.D. On: 03/02/2015 09:28.  The patient is a 69 year old female    Other Problems [name], [place]; 03/06/2015 2:12 PM) Back Pain Breast Cancer Cholelithiasis Hemorrhoids High blood pressure Lump In Breast  Past Surgical History [name], CMA; 03/06/2015 2:12 PM) Breast Biopsy Left. Cesarean Section - Multiple Hemorrhoidectomy Hysterectomy (not due to cancer) - Partial  Diagnostic Studies History [name], [place]; 03/06/2015 2:12 PM) Colonoscopy never Mammogram within last year Pap Smear >5 years ago  Allergies [name], CMA; 03/06/2015 2:12 PM) No Known Drug Allergies03/14/2016  Medication History [name], [place]; 03/06/2015 2:13 PM) Allopurinol (100MG Tablet, Oral) Active. Lisinopril-Hydrochlorothiazide (10-12.5MG Tablet, Oral) Active. Aspirin (81MG Tablet, Oral) Active.  Social History [name], [place]; 03/06/2015 2:12 PM) Caffeine use Carbonated beverages, Coffee. No alcohol use No drug use Tobacco use Never smoker.  Family History [name], [place]; 03/06/2015 2:12 PM) Cancer Mother, Sister. Malignant Neoplasm Of Pancreas Father.  Pregnancy / Birth History [name], [place]; 03/06/2015 2:12 PM) Age at menarche 3 years. Age of menopause >87 Gravida 3 Maternal age 49-35 Para 2  Review of Systems  Cassandra Maldonado CMA; 03/06/2015 2:12 PM) General Not Present- Appetite Loss, Chills, Fatigue, Fever, Night Sweats, Weight Gain and Weight Loss. Skin Not Present- Change in Wart/Mole, Dryness, Hives, Jaundice, New Lesions, Non-Healing Wounds, Rash and Ulcer. HEENT Not Present- Earache, Hearing Loss, Hoarseness, Nose Bleed, Oral Ulcers, Ringing in the Ears, Seasonal Allergies, Sinus Pain, Sore Throat, Visual Disturbances, Wears glasses/contact lenses and Yellow Eyes. Respiratory Not Present- Bloody sputum, Chronic Cough, Difficulty Breathing, Snoring and Wheezing. Breast Present- Breast Mass and Breast Pain. Not Present- Nipple Discharge and Skin Changes. Cardiovascular Not Present- Chest Pain, Difficulty Breathing Lying Down, Leg Cramps, Palpitations, Rapid Heart Rate, Shortness of Breath and Swelling of Extremities. Gastrointestinal Not Present- Abdominal Pain, Bloating, Bloody Stool, Change in Bowel Habits, Chronic diarrhea, Constipation, Difficulty Swallowing, Excessive gas, Gets full quickly at meals, Hemorrhoids, Indigestion, Nausea, Rectal Pain and Vomiting. Female Genitourinary Not Present- Frequency, Nocturia, Painful Urination, Pelvic Pain and Urgency. Musculoskeletal Not Present- Back Pain, Joint Pain, Joint Stiffness, Muscle Pain, Muscle Weakness and Swelling of Extremities. Neurological Not Present- Decreased Memory, Fainting, Headaches, Numbness, Seizures, Tingling, Tremor, Trouble walking and Weakness. Psychiatric Not Present- Anxiety, Bipolar, Change in Sleep Pattern, Depression, Fearful and Frequent crying. Endocrine Not Present- Cold Intolerance, Excessive Hunger, Hair Changes, Heat Intolerance, Hot flashes and New Diabetes. Hematology Not Present- Easy Bruising, Excessive bleeding, Gland problems, HIV and Persistent Infections.   Vitals [employer] R. Maldonado CMA; 03/06/2015 2:11 PM) 03/06/2015 2:11 PM Weight: 240.5 lb Height: 65in Body Surface Area: 2.24 m Body Mass Index:  40.02 kg/m BP: 136/84 (Sitting, Left Arm, Standard)    Physical Exam (Thomas A. Cornett MD; 03/06/2015 3:05 PM) General Mental Status-Alert. General Appearance-Consistent with stated age. Hydration-Well hydrated. Voice-Normal.  Chest and Lung Exam Chest and lung exam reveals -quiet, even and easy respiratory effort with no use of accessory muscles and on auscultation, normal breath sounds, no adventitious sounds and normal vocal resonance. Inspection Chest Wall - Normal.  Back - normal.  Breast Note: 3 CM LEFT BREAST MASS MOBILE SOME SORENESS RIGHT BREAST NORMAL.   Cardiovascular Cardiovascular examination reveals -on palpation PMI is normal in location and amplitude, no palpable S3 or S4. Normal cardiac borders., normal heart sounds, regular rate and rhythm with no murmurs, carotid auscultation reveals no bruits and normal pedal pulses bilaterally.  Neurologic Neurologic evaluation reveals -alert and oriented x 3 with no impairment of recent or remote memory. Mental Status-Normal.  Musculoskeletal Normal Exam - Left-Upper Extremity Strength Normal and Lower Extremity Strength Normal. Normal Exam - Right-Upper Extremity Strength Normal, Lower Extremity Weakness.  Lymphatic Axillary  Lateral Axillary Nodes: Left - Size - 2.5 cm. Consistency - Firm. Mobility - Fixed to underlying tissue. Shape - Matted nodes.    Assessment & Plan (Thomas A. Cornett MD; 03/06/2015 3:01 PM) BREAST CANCER, LEFT (174.9  C50.912) Impression: bulky axillary adenopathy on the left. large upper outer quadrant breast tumor. may need preop chemotherapy and attempt at lumpectomy later. discussed port placement as well. return 1 week.

## 2015-03-16 NOTE — Anesthesia Procedure Notes (Signed)
Procedure Name: LMA Insertion Date/Time: 03/16/2015 9:29 AM Performed by: Lieutenant Diego Pre-anesthesia Checklist: Patient identified, Emergency Drugs available, Suction available and Patient being monitored Patient Re-evaluated:Patient Re-evaluated prior to inductionOxygen Delivery Method: Circle System Utilized Preoxygenation: Pre-oxygenation with 100% oxygen Intubation Type: IV induction Ventilation: Mask ventilation without difficulty LMA: LMA inserted LMA Size: 4.0 Number of attempts: 1 Airway Equipment and Method: Bite block Placement Confirmation: positive ETCO2 and breath sounds checked- equal and bilateral Tube secured with: Tape Dental Injury: Teeth and Oropharynx as per pre-operative assessment

## 2015-03-16 NOTE — Transfer of Care (Signed)
Immediate Anesthesia Transfer of Care Note  Patient: Cassandra Maldonado  Procedure(s) Performed: Procedure(s): INSERTION PORT-A-CATH (N/A)  Patient Location: PACU  Anesthesia Type:General  Level of Consciousness: awake  Airway & Oxygen Therapy: Patient Spontanous Breathing and Patient connected to face mask oxygen  Post-op Assessment: Report given to RN and Post -op Vital signs reviewed and stable  Post vital signs: Reviewed and stable  Last Vitals:  Filed Vitals:   03/16/15 1012  BP: 116/61  Pulse:   Temp:   Resp:     Complications: No apparent anesthesia complications

## 2015-03-16 NOTE — Anesthesia Preprocedure Evaluation (Signed)
Anesthesia Evaluation  Patient identified by MRN, date of birth, ID band  Reviewed: Allergy & Precautions, NPO status , Patient's Chart, lab work & pertinent test results  Airway Mallampati: I  TM Distance: >3 FB Neck ROM: Full    Dental   Pulmonary sleep apnea ,          Cardiovascular hypertension, Pt. on medications     Neuro/Psych    GI/Hepatic   Endo/Other    Renal/GU      Musculoskeletal   Abdominal   Peds  Hematology   Anesthesia Other Findings   Reproductive/Obstetrics                             Anesthesia Physical Anesthesia Plan  ASA: II  Anesthesia Plan: General   Post-op Pain Management:    Induction: Intravenous  Airway Management Planned: LMA  Additional Equipment:   Intra-op Plan:   Post-operative Plan: Extubation in OR  Informed Consent: I have reviewed the patients History and Physical, chart, labs and discussed the procedure including the risks, benefits and alternatives for the proposed anesthesia with the patient or authorized representative who has indicated his/her understanding and acceptance.     Plan Discussed with: CRNA and Surgeon  Anesthesia Plan Comments:         Anesthesia Quick Evaluation

## 2015-03-16 NOTE — Interval H&P Note (Signed)
History and Physical Interval Note:  03/16/2015 9:07 AM  Cassandra Maldonado  has presented today for surgery, with the diagnosis of Left Breast Cancer  The various methods of treatment have been discussed with the patient and family. After consideration of risks, benefits and other options for treatment, the patient has consented to  Procedure(s): INSERTION PORT-A-CATH (N/A) as a surgical intervention .  The patient's history has been reviewed, patient examined, no change in status, stable for surgery.  I have reviewed the patient's chart and labs.  Questions were answered to the patient's satisfaction.     Marthe Dant

## 2015-03-16 NOTE — Discharge Instructions (Signed)
° ° °PORT-A-CATH: POST OP INSTRUCTIONS ° °Always review your discharge instruction sheet given to you by the facility where your surgery was performed.  ° °1. A prescription for pain medication may be given to you upon discharge. Take your pain medication as prescribed, if needed. If narcotic pain medicine is not needed, then you make take acetaminophen (Tylenol) or ibuprofen (Advil) as needed.  °2. Take your usually prescribed medications unless otherwise directed. °3. If you need a refill on your pain medication, please contact our office. All narcotic pain medicine now requires a paper prescription.  Phoned in and fax refills are no longer allowed by law.  Prescriptions will not be filled after 5 pm or on weekends.  °4. You should follow a light diet for the remainder of the day after your procedure. °5. Most patients will experience some mild swelling and/or bruising in the area of the incision. It may take several days to resolve. °6. It is common to experience some constipation if taking pain medication after surgery. Increasing fluid intake and taking a stool softener (such as Colace) will usually help or prevent this problem from occurring. A mild laxative (Milk of Magnesia or Miralax) should be taken according to package directions if there are no bowel movements after 48 hours.  °7. Unless discharge instructions indicate otherwise, you may remove your bandages 48 hours after surgery, and you may shower at that time. You may have steri-strips (small white skin tapes) in place directly over the incision.  These strips should be left on the skin for 7-10 days.  If your surgeon used Dermabond (skin glue) on the incision, you may shower in 24 hours.  The glue will flake off over the next 2-3 weeks.  °8. If your port is left accessed at the end of surgery (needle left in port), the dressing cannot get wet and should only by changed by a healthcare professional. When the port is no longer accessed (when the  needle has been removed), follow step 7.   °9. ACTIVITIES:  Limit activity involving your arms for the next 72 hours. Do no strenuous exercise or activity for 1 week. You may drive when you are no longer taking prescription pain medication, you can comfortably wear a seatbelt, and you can maneuver your car. °10.You may need to see your doctor in the office for a follow-up appointment.  Please °      check with your doctor.  °11.When you receive a new Port-a-Cath, you will get a product guide and  °      ID card.  Please keep them in case you need them. ° °WHEN TO CALL YOUR DOCTOR ([phone]): °1. Fever over 101.0 °2. Chills °3. Continued bleeding from incision °4. Increased redness and tenderness at the site °5. Shortness of breath, difficulty breathing ° ° °The clinic staff is available to answer your questions during regular business hours. Please don’t hesitate to call and ask to speak to one of the nurses or medical assistants for clinical concerns. If you have a medical emergency, go to the nearest emergency room or call 911.  A surgeon from [facility] is always on call at the hospital.  ° ° ° °For further information, please visit [email or web address] ° ° °Post Anesthesia Home Care Instructions ° °Activity: °Get plenty of rest for the remainder of the day. A responsible adult should stay with you for 24 hours following the procedure.  °For the next 24 hours, DO NOT: °-Drive a car °-  Operate machinery °-Drink alcoholic beverages °-Take any medication unless instructed by your physician °-Make any legal decisions or sign important papers. ° °Meals: °Start with liquid foods such as gelatin or soup. Progress to regular foods as tolerated. Avoid greasy, spicy, heavy foods. If nausea and/or vomiting occur, drink only clear liquids until the nausea and/or vomiting subsides. Call your physician if vomiting continues. ° °Special Instructions/Symptoms: °Your throat may feel dry or sore from the  anesthesia or the breathing tube placed in your throat during surgery. If this causes discomfort, gargle with warm salt water. The discomfort should disappear within 24 hours. ° ° ° ° ° ° °

## 2015-03-16 NOTE — Anesthesia Postprocedure Evaluation (Signed)
  Anesthesia Post-op Note  Patient: Cassandra Maldonado  Procedure(s) Performed: Procedure(s): INSERTION PORT-A-CATH (N/A)  Patient Location: PACU  Anesthesia Type:General  Level of Consciousness: awake  Airway and Oxygen Therapy: Patient Spontanous Breathing  Post-op Pain: mild  Post-op Assessment: Post-op Vital signs reviewed  Post-op Vital Signs: Reviewed  Last Vitals:  Filed Vitals:   03/16/15 1115  BP:   Pulse: 73  Temp:   Resp: 20    Complications: No apparent anesthesia complications

## 2015-03-17 ENCOUNTER — Other Ambulatory Visit: Payer: Self-pay | Admitting: Hematology and Oncology

## 2015-03-17 ENCOUNTER — Other Ambulatory Visit: Payer: Self-pay | Admitting: *Deleted

## 2015-03-17 ENCOUNTER — Encounter (HOSPITAL_COMMUNITY)
Admission: RE | Admit: 2015-03-17 | Discharge: 2015-03-17 | Disposition: A | Discharge status: Home / Self Care | Payer: Medicare Other | Source: Ambulatory Visit | Attending: Hematology and Oncology | Admitting: Hematology and Oncology

## 2015-03-17 ENCOUNTER — Telehealth: Payer: Self-pay | Admitting: Hematology and Oncology

## 2015-03-17 ENCOUNTER — Telehealth: Payer: Self-pay | Admitting: *Deleted

## 2015-03-17 ENCOUNTER — Ambulatory Visit (HOSPITAL_COMMUNITY)
Admission: RE | Admit: 2015-03-17 | Discharge: 2015-03-17 | Disposition: A | Discharge status: Home / Self Care | Payer: Medicare Other | Source: Ambulatory Visit | Attending: Hematology and Oncology | Admitting: Hematology and Oncology

## 2015-03-17 DIAGNOSIS — Z452 Encounter for adjustment and management of vascular access device: Secondary | ICD-10-CM | POA: Insufficient documentation

## 2015-03-17 DIAGNOSIS — C50912 Malignant neoplasm of unspecified site of left female breast: Secondary | ICD-10-CM | POA: Insufficient documentation

## 2015-03-17 DIAGNOSIS — R599 Enlarged lymph nodes, unspecified: Secondary | ICD-10-CM | POA: Insufficient documentation

## 2015-03-17 DIAGNOSIS — Z7982 Long term (current) use of aspirin: Secondary | ICD-10-CM | POA: Insufficient documentation

## 2015-03-17 DIAGNOSIS — C50412 Malignant neoplasm of upper-outer quadrant of left female breast: Secondary | ICD-10-CM

## 2015-03-17 DIAGNOSIS — I700 Atherosclerosis of aorta: Secondary | ICD-10-CM | POA: Insufficient documentation

## 2015-03-17 DIAGNOSIS — Z79899 Other long term (current) drug therapy: Secondary | ICD-10-CM | POA: Insufficient documentation

## 2015-03-17 DIAGNOSIS — I2510 Atherosclerotic heart disease of native coronary artery without angina pectoris: Secondary | ICD-10-CM | POA: Insufficient documentation

## 2015-03-17 MED ORDER — LIDOCAINE-PRILOCAINE 2.5-2.5 % EX CREA: TOPICAL_CREAM | CUTANEOUS | Status: DC

## 2015-03-17 MED ORDER — DEXAMETHASONE 4 MG PO TABS: ORAL_TABLET | ORAL | Status: DC

## 2015-03-17 MED ORDER — IOHEXOL 300 MG/ML  SOLN
100.0000 mL | Freq: Once | INTRAMUSCULAR | Status: AC | PRN
  Administered 2015-03-17: 100 mL via INTRAVENOUS

## 2015-03-17 MED ORDER — ONDANSETRON HCL 8 MG PO TABS: 8.0000 mg | ORAL_TABLET | Freq: Two times a day (BID) | ORAL | Status: DC | PRN

## 2015-03-17 MED ORDER — PROCHLORPERAZINE MALEATE 10 MG PO TABS: 10.0000 mg | ORAL_TABLET | Freq: Four times a day (QID) | ORAL | Status: DC | PRN

## 2015-03-17 MED ORDER — TECHNETIUM TC 99M MEDRONATE IV KIT
25.0000 | PACK | Freq: Once | INTRAVENOUS | Status: AC | PRN
  Administered 2015-03-17: 25 via INTRAVENOUS

## 2015-03-17 MED ORDER — LORAZEPAM 0.5 MG PO TABS: 0.5000 mg | ORAL_TABLET | Freq: Four times a day (QID) | ORAL | Status: DC | PRN

## 2015-03-17 NOTE — Telephone Encounter (Signed)
Per pof a lab has been added to 3/28

## 2015-03-17 NOTE — Telephone Encounter (Signed)
, °

## 2015-03-17 NOTE — Telephone Encounter (Signed)
Dr. Lindi Adie spoke to Cassandra Maldonado [facility] and relayed she wished to cancel her appt with Dr. Sondra Come until after surgery. Request sent to Santiago Glad to cancel appt with Dr. Sondra Come and nurse eval.

## 2015-03-20 ENCOUNTER — Other Ambulatory Visit: Payer: Self-pay | Admitting: *Deleted

## 2015-03-20 ENCOUNTER — Other Ambulatory Visit: Payer: Medicare Other

## 2015-03-20 ENCOUNTER — Ambulatory Visit: Payer: Medicare Other | Admitting: Hematology and Oncology

## 2015-03-20 ENCOUNTER — Telehealth: Payer: Self-pay | Admitting: Hematology and Oncology

## 2015-03-20 ENCOUNTER — Telehealth: Payer: Self-pay | Admitting: Family Medicine

## 2015-03-20 NOTE — Telephone Encounter (Signed)
Pre vist letter sent

## 2015-03-20 NOTE — Telephone Encounter (Signed)
Spoke with patient about added lab for 3/31

## 2015-03-21 ENCOUNTER — Other Ambulatory Visit: Payer: Self-pay | Admitting: *Deleted

## 2015-03-21 ENCOUNTER — Telehealth: Payer: Self-pay | Admitting: *Deleted

## 2015-03-21 ENCOUNTER — Encounter (HOSPITAL_BASED_OUTPATIENT_CLINIC_OR_DEPARTMENT_OTHER): Payer: Self-pay | Admitting: General Surgery

## 2015-03-21 NOTE — Telephone Encounter (Signed)
PT. HAS BEEN SICK SINCE Friday. NAUSEA AND VOMITED X1. DIARRHEA X3 INSTRUCTED PT. TO TAKE IMODIUM. PT. HAS A DRY COUGH BUT FEELS CONGESTED. THIS NOTE ROUTED TO DIANE BURLESON,RN.

## 2015-03-21 NOTE — Telephone Encounter (Signed)
Patient has appt with PCP in am. Chemo appt for 3/31 rescheduled to 4/7.

## 2015-03-21 NOTE — Telephone Encounter (Signed)
Received call from [place] at [facility] stating patient has the flu and is really sick. Called and spoke with patient to cancel her 1st chemo treatment and rescheduled it for next week 4/7 after her visit with Dr. Lindi Adie.  Encouraged her to call with any needs or concerns.

## 2015-03-22 ENCOUNTER — Ambulatory Visit: Payer: Medicare Other

## 2015-03-22 ENCOUNTER — Telehealth: Payer: Self-pay | Admitting: *Deleted

## 2015-03-22 ENCOUNTER — Ambulatory Visit (INDEPENDENT_AMBULATORY_CARE_PROVIDER_SITE_OTHER): Payer: Medicare Other | Admitting: Medical

## 2015-03-22 ENCOUNTER — Ambulatory Visit (HOSPITAL_BASED_OUTPATIENT_CLINIC_OR_DEPARTMENT_OTHER)
Admission: RE | Admit: 2015-03-22 | Discharge: 2015-03-22 | Disposition: A | Discharge status: Home / Self Care | Payer: Medicare Other | Source: Ambulatory Visit | Attending: Medical | Admitting: Medical

## 2015-03-22 ENCOUNTER — Encounter: Payer: Self-pay | Admitting: Medical

## 2015-03-22 ENCOUNTER — Encounter: Payer: Self-pay | Admitting: Hematology and Oncology

## 2015-03-22 ENCOUNTER — Ambulatory Visit
Admission: RE | Admit: 2015-03-22 | Discharge: 2015-03-22 | Disposition: A | Discharge status: Home / Self Care | Payer: Medicare Other | Source: Ambulatory Visit | Attending: Radiation Oncology | Admitting: Radiation Oncology

## 2015-03-22 VITALS — BP 136/70 | HR 76 | Temp 98.2°F | Ht 65.0 in | Wt 237.6 lb

## 2015-03-22 DIAGNOSIS — I517 Cardiomegaly: Secondary | ICD-10-CM | POA: Diagnosis not present

## 2015-03-22 DIAGNOSIS — J209 Acute bronchitis, unspecified: Secondary | ICD-10-CM

## 2015-03-22 DIAGNOSIS — R918 Other nonspecific abnormal finding of lung field: Secondary | ICD-10-CM | POA: Diagnosis not present

## 2015-03-22 DIAGNOSIS — R509 Fever, unspecified: Secondary | ICD-10-CM | POA: Diagnosis not present

## 2015-03-22 DIAGNOSIS — R0989 Other specified symptoms and signs involving the circulatory and respiratory systems: Secondary | ICD-10-CM | POA: Insufficient documentation

## 2015-03-22 DIAGNOSIS — R05 Cough: Secondary | ICD-10-CM | POA: Diagnosis not present

## 2015-03-22 HISTORY — DX: Acute bronchitis, unspecified: J20.9

## 2015-03-22 HISTORY — DX: Malignant neoplasm of unspecified site of left female breast: C50.912

## 2015-03-22 LAB — CBC WITH DIFFERENTIAL/PLATELET
BASOS PCT: 0.4 % (ref 0.0–3.0)
Basophils Absolute: 0 10*3/uL (ref 0.0–0.1)
Eosinophils Absolute: 0 10*3/uL (ref 0.0–0.7)
Eosinophils Relative: 0 % (ref 0.0–5.0)
HEMATOCRIT: 35.6 % — AB (ref 36.0–46.0)
Hemoglobin: 11.6 g/dL — ABNORMAL LOW (ref 12.0–15.0)
Lymphocytes Relative: 32.6 % (ref 12.0–46.0)
Lymphs Abs: 1.5 10*3/uL (ref 0.7–4.0)
MCHC: 32.8 g/dL (ref 30.0–36.0)
MCV: 87.6 fl (ref 78.0–100.0)
MONO ABS: 0.4 10*3/uL (ref 0.1–1.0)
MONOS PCT: 8.9 % (ref 3.0–12.0)
Neutro Abs: 2.7 10*3/uL (ref 1.4–7.7)
Neutrophils Relative %: 58.1 % (ref 43.0–77.0)
Platelets: 192 10*3/uL (ref 150.0–400.0)
RBC: 4.06 Mil/uL (ref 3.87–5.11)
RDW: 15.5 % (ref 11.5–15.5)
WBC: 4.6 10*3/uL (ref 4.0–10.5)

## 2015-03-22 MED ORDER — MOMETASONE FUROATE 50 MCG/ACT NA SUSP: NASAL | Status: DC

## 2015-03-22 MED ORDER — AZITHROMYCIN 250 MG PO TABS: ORAL_TABLET | ORAL | Status: DC

## 2015-03-22 MED ORDER — BENZONATATE 100 MG PO CAPS: 200.0000 mg | ORAL_CAPSULE | Freq: Three times a day (TID) | ORAL | Status: DC | PRN

## 2015-03-22 NOTE — Telephone Encounter (Signed)
Prior authorization for mometasone initiated. Awaiting determination. JG//CMA

## 2015-03-22 NOTE — Patient Instructions (Signed)
Acute bronchitis You appear to have bronchitis. Rest hydrate and tylenol for fever. I am prescribing cough medicine benzonatate, and azithromycin antibiotic. For your nasal congestion nasonex. If nasonex not covered then flonase otc.  Bronchitis appears to be post flu which started on Friday. Now 5 days into illness. Past the 48 hour treatment window.  Do want to get cbc to assess wbc as you approach upcoming chemo in one week. Also evaluate cxr to rule out pneumonia  Follow up in 7-10 days or as needed

## 2015-03-22 NOTE — Progress Notes (Signed)
Pre visit review using our clinic review tool, if applicable. No additional management support is needed unless otherwise documented below in the visit note. 

## 2015-03-22 NOTE — Telephone Encounter (Signed)
Received office notes from [facility]. Sent to scan. 

## 2015-03-22 NOTE — Progress Notes (Signed)
I sent prior auth req to [facility] for ondansetron

## 2015-03-22 NOTE — Telephone Encounter (Signed)
Repeat cxr after 10 days. Order placed.

## 2015-03-22 NOTE — Progress Notes (Signed)
Subjective:    Patient ID: Cassandra Maldonado, female    DOB: [birth year], 69 y.o.   MRN: [id number]  HPI   Pt in with some cough this past Friday. Then Friday and Saturday had fever. Pt husband was sick. He had some low fever and body aches all over. He was not seen or treated. Pt was supposed to start chemo this week for breast cancer. But that is now delayed. Fever today of 101. Pt has had some body aches over the weekend. Some nausea. But no vomiting. No sinus pressure. Pt has some occasional productive cough. Severe cough at times that makes her dry heave.   Review of Systems  Constitutional: Positive for fever and fatigue. Negative for chills.  HENT: Positive for congestion. Negative for ear pain, sinus pressure, sore throat and tinnitus.   Respiratory: Positive for cough. Negative for chest tightness, shortness of breath and wheezing.   Cardiovascular: Negative for chest pain and palpitations.  Gastrointestinal: Positive for nausea. Negative for vomiting, abdominal pain and diarrhea.  Musculoskeletal: Positive for myalgias.  Neurological: Positive for dizziness. Negative for seizures, weakness and headaches.       Mild dizziness off and on with recent illness.  Hematological: Negative for adenopathy. Does not bruise/bleed easily.   Past Medical History  Diagnosis Date  . SOB (shortness of breath)   . Chest pressure   . Palpitations   . Hypertension   . Hypercholesterolemia   . Obesity   . Chicken pox   . 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 Breast cancer, left breast   . Family history of ovarian cancer   . Sleep apnea     never tested  . Full dentures   . Wears glasses     History   Social History  . Marital Status: Married    Spouse Name: N/A  . Number of Children: 2  . Years of Education: N/A   Occupational History  . Not on file.   Social History Main Topics  . Smoking status: Never Smoker   . Smokeless tobacco: Never Used  . Alcohol  Use: No  . Drug Use: No  . Sexual Activity: Not on file   Other Topics Concern  . Not on file   Social History Narrative    Past Surgical History  Procedure Laterality Date  . Retinal detachment surgery  2006    rt  . Vaginal hysterectomy    . Cesarean section  1980  . Hemorroidectomy    . Portacath placement N/A 03/16/2015    Procedure: INSERTION PORT-A-CATH;  Surgeon: Rolm Bookbinder, MD;  Location: [facility];  Service: General;  Laterality: N/A;    Family History  Problem Relation Age of Onset  . Diabetes Brother   . Arthritis Maternal Grandmother   . Lymphoma Mother     dx in her late 46s  . Liver cancer Father 88  . Ovarian cancer Sister 32    primary peritoneal cancer    No Known Allergies  Current Outpatient Prescriptions on File Prior to Visit  Medication Sig Dispense Refill  . allopurinol (ZYLOPRIM) 100 MG tablet TAKE ONE TABLET BY MOUTH TWICE DAILY 180 tablet 1  . aspirin 81 MG tablet Take 81 mg by mouth daily.    Marland Kitchen dexamethasone (DECADRON) 4 MG tablet Take 2 tablets by mouth once a day on the day after chemotherapy and then take 2 tablets two times a day for 2 days. Take with food.  30 tablet 1  . fluticasone (CUTIVATE) 0.005 % ointment Apply 1 application topically 3 (three) times daily. 30 g 0  . Homeopathic Products (LEG CRAMP RELIEF) TABS Take 2 tablets by mouth daily as needed.    . lidocaine-prilocaine (EMLA) cream Apply to affected area once 30 g 3  . lisinopril-hydrochlorothiazide (PRINZIDE,ZESTORETIC) 10-12.5 MG per tablet TAKE ONE TABLET BY MOUTH ONCE DAILY 90 tablet 1  . LORazepam (ATIVAN) 0.5 MG tablet Take 1 tablet (0.5 mg total) by mouth every 6 (six) hours as needed (Nausea or vomiting). 30 tablet 0  . ondansetron (ZOFRAN) 8 MG tablet Take 1 tablet (8 mg total) by mouth 2 (two) times daily as needed. Start on the third day after chemotherapy. 30 tablet 1  . oxyCODONE-acetaminophen (PERCOCET) 10-325 MG per tablet Take 1 tablet by  mouth every 6 (six) hours as needed for pain. 10 tablet 0  . Probiotic Product (PROBIOTIC DAILY PO) Take 1 capsule by mouth daily.    . prochlorperazine (COMPAZINE) 10 MG tablet Take 1 tablet (10 mg total) by mouth every 6 (six) hours as needed (Nausea or vomiting). 30 tablet 1   No current facility-administered medications on file prior to visit.    BP 136/70 mmHg  Pulse 76  Temp(Src) 98.2 F (36.8 C) (Oral)  Ht 5\' 5"  (1.651 m)  Wt 237 lb 9.6 oz (107.775 kg)  BMI 39.54 kg/m2  SpO2 95%      Objective:   Physical Exam  General  Mental Status - Alert. General Appearance - Well groomed. Not in acute distress.  Skin Rashes- No Rashes.  HEENT Head- Normal. Ear Auditory Canal - Left- Normal. Right - Normal.Tympanic Membrane- Left- Normal. Right- Normal. Eye Sclera/Conjunctiva- Left- Normal. Right- Normal. Nose & Sinuses Nasal Mucosa- Left-  Boggy and Congested. Right-  Boggy and  Congested.No bilateral maxillary or frontal sinus pressure. Mouth & Throat Lips: Upper Lip- Normal: no dryness, cracking, pallor, cyanosis, or vesicular eruption. Lower Lip-Normal: no dryness, cracking, pallor, cyanosis or vesicular eruption. Buccal Mucosa- Bilateral- No Aphthous ulcers. Oropharynx- No Discharge or Erythema. Tonsils: Characteristics- Bilateral- No Erythema or Congestion. Size/Enlargement- Bilateral- No enlargement. Discharge- bilateral-None.  Neck Neck- Supple. No Masses.   Chest and Lung Exam Auscultation: Breath Sounds:-even and unlabored. Mild upper lobe rhonchi  Cardiovascular Auscultation:Rythm- Regular, rate and rhythm. Murmurs & Other Heart Sounds:Ausculatation of the heart reveal- No Murmurs.  Lymphatic Head & Neck General Head & Neck Lymphatics: Bilateral: Description- No Localized lymphadenopathy.       Assessment & Plan:

## 2015-03-22 NOTE — Assessment & Plan Note (Signed)
You appear to have bronchitis. Rest hydrate and tylenol for fever. I am prescribing cough medicine benzonatate, and azithromycin antibiotic. For your nasal congestion nasonex. If nasonex not covered then flonase otc.  Bronchitis appears to be post flu which started on Friday. Now 5 days into illness. Past the 48 hour treatment window.  Do want to get cbc to assess wbc as you approach upcoming chemo in one week. Also evaluate cxr to rule out pneumonia  Follow up in 7-10 days or as needed

## 2015-03-23 ENCOUNTER — Other Ambulatory Visit: Payer: Medicare Other

## 2015-03-23 ENCOUNTER — Encounter: Payer: Self-pay | Admitting: Hematology and Oncology

## 2015-03-23 ENCOUNTER — Ambulatory Visit: Payer: Medicare Other

## 2015-03-23 NOTE — Telephone Encounter (Signed)
Cassandra Maldonado from [facility] [phone]  Denied due to dx a non FDA approved reason for this medication.

## 2015-03-23 NOTE — Progress Notes (Signed)
I placed bcbs prior auth forms on desk of Dr. Geralyn Flash nurse.

## 2015-03-27 ENCOUNTER — Telehealth: Payer: Self-pay | Admitting: Family Medicine

## 2015-03-27 NOTE — Telephone Encounter (Signed)
Appeal started. 

## 2015-03-27 NOTE — Telephone Encounter (Signed)
Callled patient advisded she may need to come back in for re-evaluation, Pat scheduled for appointment tomorrow at 915am.

## 2015-03-27 NOTE — Telephone Encounter (Signed)
Caller name: Marlene, [place] Relation to pt: self  Call back number: [phone] Pharmacy:  Reason for call:  Pt exp. Reoccurring brochities symtomps in need of clinical advice. Please advise

## 2015-03-28 ENCOUNTER — Ambulatory Visit (INDEPENDENT_AMBULATORY_CARE_PROVIDER_SITE_OTHER): Payer: Medicare Other | Admitting: Medical

## 2015-03-28 ENCOUNTER — Encounter: Payer: Self-pay | Admitting: Genetic Counselor

## 2015-03-28 ENCOUNTER — Ambulatory Visit (HOSPITAL_BASED_OUTPATIENT_CLINIC_OR_DEPARTMENT_OTHER)
Admission: RE | Admit: 2015-03-28 | Discharge: 2015-03-28 | Disposition: A | Discharge status: Home / Self Care | Payer: Medicare Other | Source: Ambulatory Visit | Attending: Medical | Admitting: Medical

## 2015-03-28 ENCOUNTER — Encounter: Payer: Self-pay | Admitting: Medical

## 2015-03-28 ENCOUNTER — Telehealth: Payer: Self-pay | Admitting: Genetic Counselor

## 2015-03-28 VITALS — BP 130/81 | HR 86 | Temp 98.1°F | Ht 65.0 in | Wt 236.2 lb

## 2015-03-28 DIAGNOSIS — J209 Acute bronchitis, unspecified: Secondary | ICD-10-CM

## 2015-03-28 DIAGNOSIS — R0602 Shortness of breath: Secondary | ICD-10-CM | POA: Insufficient documentation

## 2015-03-28 DIAGNOSIS — R918 Other nonspecific abnormal finding of lung field: Secondary | ICD-10-CM | POA: Diagnosis not present

## 2015-03-28 DIAGNOSIS — Z1379 Encounter for other screening for genetic and chromosomal anomalies: Secondary | ICD-10-CM | POA: Insufficient documentation

## 2015-03-28 DIAGNOSIS — R05 Cough: Secondary | ICD-10-CM | POA: Insufficient documentation

## 2015-03-28 LAB — CBC WITH DIFFERENTIAL/PLATELET
Basophils Absolute: 0 10*3/uL (ref 0.0–0.1)
Basophils Relative: 0.5 % (ref 0.0–3.0)
EOS ABS: 0.1 10*3/uL (ref 0.0–0.7)
Eosinophils Relative: 1.5 % (ref 0.0–5.0)
HCT: 33.5 % — ABNORMAL LOW (ref 36.0–46.0)
HEMOGLOBIN: 11 g/dL — AB (ref 12.0–15.0)
Lymphocytes Relative: 19.3 % (ref 12.0–46.0)
Lymphs Abs: 1.7 10*3/uL (ref 0.7–4.0)
MCHC: 32.9 g/dL (ref 30.0–36.0)
MCV: 86.7 fl (ref 78.0–100.0)
Monocytes Absolute: 0.9 10*3/uL (ref 0.1–1.0)
Monocytes Relative: 10.8 % (ref 3.0–12.0)
NEUTROS ABS: 6 10*3/uL (ref 1.4–7.7)
NEUTROS PCT: 67.9 % (ref 43.0–77.0)
Platelets: 375 10*3/uL (ref 150.0–400.0)
RBC: 3.86 Mil/uL — AB (ref 3.87–5.11)
RDW: 15.4 % (ref 11.5–15.5)
WBC: 8.8 10*3/uL (ref 4.0–10.5)

## 2015-03-28 MED ORDER — BENZONATATE 200 MG PO CAPS: 200.0000 mg | ORAL_CAPSULE | Freq: Three times a day (TID) | ORAL | Status: DC | PRN

## 2015-03-28 MED ORDER — LISINOPRIL-HYDROCHLOROTHIAZIDE 10-12.5 MG PO TABS: ORAL_TABLET | ORAL | Status: DC

## 2015-03-28 MED ORDER — ALLOPURINOL 100 MG PO TABS: ORAL_TABLET | ORAL | Status: DC

## 2015-03-28 MED ORDER — ALBUTEROL SULFATE HFA 108 (90 BASE) MCG/ACT IN AERS: 2.0000 | INHALATION_SPRAY | Freq: Four times a day (QID) | RESPIRATORY_TRACT | Status: DC | PRN

## 2015-03-28 MED ORDER — CEFTRIAXONE SODIUM 1 G IJ SOLR
1.0000 g | Freq: Once | INTRAMUSCULAR | Status: AC
  Administered 2015-03-28: 1 g via INTRAMUSCULAR

## 2015-03-28 NOTE — Progress Notes (Signed)
Subjective:    Patient ID: Cassandra Maldonado, female    DOB: 07-Apr-1946, 69 y.o.   MRN: [id number]  HPI  Pt in overall feels better with no fever since Saturday. Pt feels like needs to bring up mucous from chest but can't. Last night almost like mild rattle(maybe wheeze).  Pt 7 days post start of treatment.   Xray mentioned possilbility of early bilateral pneumonia.Pt still reporting fatigue. No white count elevation.  Pt was set to start chemo this Thursday this week. Pt will see Dr.on day of procedure and they will decide.  No history of asthma or inhaler use.  Review of Systems  Constitutional: Negative for fever, chills and fatigue.  HENT: Positive for congestion. Negative for ear pain, mouth sores, nosebleeds, postnasal drip, rhinorrhea, sinus pressure and sore throat.   Respiratory: Positive for cough. Negative for chest tightness, shortness of breath and wheezing.   Cardiovascular: Negative for chest pain and palpitations.  Gastrointestinal: Negative for abdominal pain.  Musculoskeletal: Negative for back pain and joint swelling.  Skin: Negative for rash.  Hematological: Negative for adenopathy. Does not bruise/bleed easily.   Past Medical History  Diagnosis Date  . SOB (shortness of breath)   . Chest pressure   . Palpitations   . Hypertension   . Hypercholesterolemia   . Obesity   . Chicken pox   . 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 Breast cancer, left breast   . Family history of ovarian cancer   . Sleep apnea     never tested  . Full dentures   . Wears glasses     History   Social History  . Marital Status: Married    Spouse Name: N/A  . Number of Children: 2  . Years of Education: N/A   Occupational History  . Not on file.   Social History Main Topics  . Smoking status: Never Smoker   . Smokeless tobacco: Never Used  . Alcohol Use: No  . Drug Use: No  . Sexual Activity: Not on file   Other Topics Concern  . Not on file    Social History Narrative    Past Surgical History  Procedure Laterality Date  . Retinal detachment surgery  2006    rt  . Vaginal hysterectomy    . Cesarean section  1980  . Hemorroidectomy    . Portacath placement N/A 03/16/2015    Procedure: INSERTION PORT-A-CATH;  Surgeon: Rolm Bookbinder, MD;  Location: [facility];  Service: General;  Laterality: N/A;    Family History  Problem Relation Age of Onset  . Diabetes Brother   . Arthritis Maternal Grandmother   . Lymphoma Mother     dx in her late 15s  . Liver cancer Father 38  . Ovarian cancer Sister 35    primary peritoneal cancer    No Known Allergies  Current Outpatient Prescriptions on File Prior to Visit  Medication Sig Dispense Refill  . aspirin 81 MG tablet Take 81 mg by mouth daily.    Marland Kitchen dexamethasone (DECADRON) 4 MG tablet Take 2 tablets by mouth once a day on the day after chemotherapy and then take 2 tablets two times a day for 2 days. Take with food. 30 tablet 1  . lidocaine-prilocaine (EMLA) cream Apply to affected area once 30 g 3  . LORazepam (ATIVAN) 0.5 MG tablet Take 1 tablet (0.5 mg total) by mouth every 6 (six) hours as needed (Nausea or vomiting). [place]  tablet 0  . ondansetron (ZOFRAN) 8 MG tablet Take 1 tablet (8 mg total) by mouth 2 (two) times daily as needed. Start on the third day after chemotherapy. 30 tablet 1  . prochlorperazine (COMPAZINE) 10 MG tablet Take 1 tablet (10 mg total) by mouth every 6 (six) hours as needed (Nausea or vomiting). 30 tablet 1   No current facility-administered medications on file prior to visit.    BP 130/81 mmHg  Pulse 86  Temp(Src) 98.1 F (36.7 C) (Oral)  Ht 5\' 5"  (1.651 m)  Wt 236 lb 3.2 oz (107.14 kg)  BMI 39.31 kg/m2  SpO2 96%      Objective:   Physical Exam  General  Mental Status - Alert. General Appearance - Well groomed. Not in acute distress.  Skin Rashes- No Rashes.  HEENT Head- Normal. Ear Auditory Canal - Left- Normal.  Right - Normal.Tympanic Membrane- Left- Normal. Right- Normal. Eye Sclera/Conjunctiva- Left- Normal. Right- Normal. Nose & Sinuses Nasal Mucosa- Left-  Boggy and Congested. Right-  Boggy and  Congested.no  maxillary or  frontal sinus pressure. Mouth & Throat Lips: Upper Lip- Normal: no dryness, cracking, pallor, cyanosis, or vesicular eruption. Lower Lip-Normal: no dryness, cracking, pallor, cyanosis or vesicular eruption. Buccal Mucosa- Bilateral- No Aphthous ulcers. Oropharynx- No Discharge or Erythema. Tonsils: Characteristics- Bilateral- No Erythema or Congestion. Size/Enlargement- Bilateral- No enlargement. Discharge- bilateral-None.  Neck Neck- Supple. No Masses.   Chest and Lung Exam Auscultation: Breath Sounds:-Clear even and unlabored. Only minimal faint upper lobe rhonchi at best.  Cardiovascular Auscultation:Rythm- Regular, rate and rhythm. Murmurs & Other Heart Sounds:Ausculatation of the heart reveal- No Murmurs.  Lymphatic Head & Neck General Head & Neck Lymphatics: Bilateral: Description- No Localized lymphadenopathy.        Assessment & Plan:

## 2015-03-28 NOTE — Telephone Encounter (Signed)
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  She has been sick and is unsure when she will start getting chemo.  She will CB to schedule a return GC visit once she is feeling better and has a schedule for her chemo.

## 2015-03-28 NOTE — Progress Notes (Signed)
Pre visit review using our clinic review tool, if applicable. No additional management support is needed unless otherwise documented below in the visit note. 

## 2015-03-28 NOTE — Patient Instructions (Signed)
Acute bronchitis Vs possible pneumonia. Xray not conclusive. In light of upcoming chemo. Do want to repeat studies to assess current condition. Also get cbc to assess wbc count.  Rocephin 1 gram IM. After lab and cxr result determine if further oral antibiotic.  Rx benzonatate 200 mg for cough. Albuterol inhaler made available if any wheezing with current illness.  We will call you tomorrow with xray result and blood work results. Results may be helpful for specialist.  Follow up as needed/to be determined pending results from study.

## 2015-03-28 NOTE — Assessment & Plan Note (Signed)
Vs possible pneumonia. Xray not conclusive. In light of upcoming chemo. Do want to repeat studies to assess current condition. Also get cbc to assess wbc count.  Rocephin 1 gram IM. After lab and cxr result determine if further oral antibiotic.  Rx benzonatate 200 mg for cough. Albuterol inhaler made available if any wheezing with current illness.  We will call you tomorrow with xray result and blood work results. Results may be helpful for specialist.  Follow up as needed/to be determined pending results from study.

## 2015-03-29 ENCOUNTER — Telehealth: Payer: Self-pay | Admitting: *Deleted

## 2015-03-29 DIAGNOSIS — C50412 Malignant neoplasm of upper-outer quadrant of left female breast: Secondary | ICD-10-CM

## 2015-03-29 MED ORDER — AZITHROMYCIN 250 MG PO TABS: ORAL_TABLET | ORAL | Status: DC

## 2015-03-29 MED ORDER — LORAZEPAM 0.5 MG PO TABS: 0.5000 mg | ORAL_TABLET | Freq: Four times a day (QID) | ORAL | Status: DC | PRN

## 2015-03-29 NOTE — Telephone Encounter (Signed)
Mackie Pai PA-C would like to talk to Dr. Lindi Adie about this patient.  Return number is [phone].

## 2015-03-29 NOTE — Telephone Encounter (Signed)
Received Prior Authorization request via CoverMyMeds for lorazepam.  Request to Managed [employer].

## 2015-03-29 NOTE — Telephone Encounter (Signed)
No new note to this entry.

## 2015-03-29 NOTE — Telephone Encounter (Signed)
Pt is feeling better today. I advised her on the results. I did explain I will try to talk with oncologist. If I an unable to point out how she has been feeling and have them review recent cbc and cxr.

## 2015-03-29 NOTE — Telephone Encounter (Signed)
Confirmed Pharmacy location as voicemail rexceived was that "[facility] did not receive lorazepam and I know this is a medicine He wanted me to have.  Please call."   Explained to Ms. Beltre this medication cannot be sent electronically and must be called in or have a hard copy prescription but his nurse will send order today.

## 2015-03-29 NOTE — Telephone Encounter (Signed)
Discussed with Dr. Lindi Adie pt clinical presentation and he reviewed pt xrays. In light of her upcoming chemo potentially tomorrow. I am going to prescribe 4 more days of zithromycin. I called pt and notified her of the plan and will send in rx to her pharmacy.

## 2015-03-30 ENCOUNTER — Ambulatory Visit (HOSPITAL_BASED_OUTPATIENT_CLINIC_OR_DEPARTMENT_OTHER): Payer: Medicare Other | Admitting: Hematology and Oncology

## 2015-03-30 ENCOUNTER — Other Ambulatory Visit (HOSPITAL_BASED_OUTPATIENT_CLINIC_OR_DEPARTMENT_OTHER): Payer: Medicare Other

## 2015-03-30 ENCOUNTER — Encounter: Payer: Self-pay | Admitting: Hematology and Oncology

## 2015-03-30 ENCOUNTER — Ambulatory Visit (HOSPITAL_BASED_OUTPATIENT_CLINIC_OR_DEPARTMENT_OTHER): Payer: Medicare Other

## 2015-03-30 VITALS — BP 134/66 | HR 79 | Temp 98.3°F | Resp 18 | Ht 65.0 in | Wt 235.8 lb

## 2015-03-30 DIAGNOSIS — C773 Secondary and unspecified malignant neoplasm of axilla and upper limb lymph nodes: Secondary | ICD-10-CM | POA: Diagnosis not present

## 2015-03-30 DIAGNOSIS — Z5111 Encounter for antineoplastic chemotherapy: Secondary | ICD-10-CM | POA: Diagnosis not present

## 2015-03-30 DIAGNOSIS — J189 Pneumonia, unspecified organism: Secondary | ICD-10-CM

## 2015-03-30 DIAGNOSIS — C50412 Malignant neoplasm of upper-outer quadrant of left female breast: Secondary | ICD-10-CM

## 2015-03-30 LAB — COMPREHENSIVE METABOLIC PANEL (CC13)
ALBUMIN: 3 g/dL — AB (ref 3.5–5.0)
ALK PHOS: 136 U/L (ref 40–150)
ALT: 31 U/L (ref 0–55)
AST: 28 U/L (ref 5–34)
Anion Gap: 12 mEq/L — ABNORMAL HIGH (ref 3–11)
BUN: 18.2 mg/dL (ref 7.0–26.0)
CO2: 28 mEq/L (ref 22–29)
Calcium: 9.3 mg/dL (ref 8.4–10.4)
Chloride: 99 mEq/L (ref 98–109)
Creatinine: 1.1 mg/dL (ref 0.6–1.1)
EGFR: 54 mL/min/{1.73_m2} — ABNORMAL LOW (ref >90)
Glucose: 117 mg/dl (ref 70–140)
Potassium: 4.2 mEq/L (ref 3.5–5.1)
Sodium: 139 mEq/L (ref 136–145)
TOTAL PROTEIN: 7 g/dL (ref 6.4–8.3)
Total Bilirubin: 0.85 mg/dL (ref 0.20–1.20)

## 2015-03-30 LAB — CBC WITH DIFFERENTIAL/PLATELET
BASO%: 0.7 % (ref 0.0–2.0)
Basophils Absolute: 0 10*3/uL (ref 0.0–0.1)
EOS ABS: 0.1 10*3/uL (ref 0.0–0.5)
EOS%: 2.3 % (ref 0.0–7.0)
HEMATOCRIT: 32.3 % — AB (ref 34.8–46.6)
HGB: 10.5 g/dL — ABNORMAL LOW (ref 11.6–15.9)
LYMPH%: 28.3 % (ref 14.0–49.7)
MCH: 28.8 pg (ref 25.1–34.0)
MCHC: 32.5 g/dL (ref 31.5–36.0)
MCV: 88.5 fL (ref 79.5–101.0)
MONO#: 0.8 10*3/uL (ref 0.1–0.9)
MONO%: 13.2 % (ref 0.0–14.0)
NEUT%: 55.5 % (ref 38.4–76.8)
NEUTROS ABS: 3.2 10*3/uL (ref 1.5–6.5)
PLATELETS: 348 10*3/uL (ref 145–400)
RBC: 3.65 10*6/uL — ABNORMAL LOW (ref 3.70–5.45)
RDW: 14.4 % (ref 11.2–14.5)
WBC: 5.7 10*3/uL (ref 3.9–10.3)
lymph#: 1.6 10*3/uL (ref 0.9–3.3)

## 2015-03-30 MED ORDER — DOXORUBICIN HCL CHEMO IV INJECTION 2 MG/ML
60.0000 mg/m2 | Freq: Once | INTRAVENOUS | Status: AC
  Administered 2015-03-30: 134 mg via INTRAVENOUS
  Filled 2015-03-30: qty 67

## 2015-03-30 MED ORDER — PALONOSETRON HCL INJECTION 0.25 MG/5ML
INTRAVENOUS | Status: AC
  Filled 2015-03-30: qty 5

## 2015-03-30 MED ORDER — SODIUM CHLORIDE 0.9 % IV SOLN
Freq: Once | INTRAVENOUS | Status: AC
  Administered 2015-03-30: 16:00:00 via INTRAVENOUS
  Filled 2015-03-30: qty 5

## 2015-03-30 MED ORDER — PALONOSETRON HCL INJECTION 0.25 MG/5ML
0.2500 mg | Freq: Once | INTRAVENOUS | Status: AC
  Administered 2015-03-30: 0.25 mg via INTRAVENOUS

## 2015-03-30 MED ORDER — HEPARIN SOD (PORK) LOCK FLUSH 100 UNIT/ML IV SOLN
500.0000 [IU] | Freq: Once | INTRAVENOUS | Status: AC | PRN
  Administered 2015-03-30: 500 [IU]
  Filled 2015-03-30: qty 5

## 2015-03-30 MED ORDER — SODIUM CHLORIDE 0.9 % IJ SOLN
10.0000 mL | INTRAMUSCULAR | Status: DC | PRN
Start: 2015-03-30 — End: 2015-03-30
  Administered 2015-03-30: 10 mL
  Filled 2015-03-30: qty 10

## 2015-03-30 MED ORDER — PEGFILGRASTIM 6 MG/0.6ML ~~LOC~~ PSKT
6.0000 mg | PREFILLED_SYRINGE | Freq: Once | SUBCUTANEOUS | Status: AC
  Administered 2015-03-30: 6 mg via SUBCUTANEOUS
  Filled 2015-03-30: qty 0.6

## 2015-03-30 MED ORDER — SODIUM CHLORIDE 0.9 % IV SOLN
600.0000 mg/m2 | Freq: Once | INTRAVENOUS | Status: AC
  Administered 2015-03-30: 1340 mg via INTRAVENOUS
  Filled 2015-03-30: qty 67

## 2015-03-30 MED ORDER — SODIUM CHLORIDE 0.9 % IV SOLN
Freq: Once | INTRAVENOUS | Status: AC
  Administered 2015-03-30: 16:00:00 via INTRAVENOUS

## 2015-03-30 NOTE — Progress Notes (Signed)
Patient Care Team: Midge Minium, MD as PCP - General (Family Medicine) Meriam Sprague Saguier, PA-C as Physician Assistant (Physician Assistant)  DIAGNOSIS: No matching staging information was found for the patient.  SUMMARY OF ONCOLOGIC HISTORY:   Breast cancer of upper-outer quadrant of left female breast   03/02/2015 Initial Diagnosis Left breast biopsy 1:00: Invasive mammary cancer, grade 3, 1 lymph node biopsy in the axilla positive, ER 12%, PR 0%, Ki-67 100%, HER-2 negative ratio 1.53   03/08/2015 Breast MRI Left breast 1:00 position: 3.6 cm mass with enlarged axillary lymph nodes multiple levels 1213 left axillary lymph nodes largest 4 cm, total chondroitin nor mass measures 10.2 cm   03/30/2015 -  Neo-Adjuvant Chemotherapy Adriamycin Cytoxan 4 followed by Abraxane weekly 12    CHIEF COMPLIANT: recent pneumonia, here to start cycle 1 day 1 Adriamycin and Cytoxan  INTERVAL HISTORY: Cassandra Maldonado is a35 year old with above-mentioned history of left-sided breast cancer with enlarged axillary lymph nodes, is here to start first cycle of Adriamycin and Cytoxan. Her treatment was held from last week because she had pneumonia like symptoms and was treated with antibiotics. She had a repeat chest x-ray which did not show any major improvement in the pneumonia. But her fevers did normalize. She continues to have a mild cough with expectoration. It is unclear if these are related to allergies. The chest x-ray repeat revealed pneumonitis-like changes.  REVIEW OF SYSTEMS:   Constitutional: Denies fevers, chills or abnormal weight loss Eyes: Denies blurriness of vision Ears, nose, mouth, throat, and face: Denies mucositis or sore throat Respiratory: Denies cough, dyspnea or wheezes Cardiovascular: Denies palpitation, chest discomfort or lower extremity swelling Gastrointestinal:  Denies nausea, heartburn or change in bowel habits Skin: Denies abnormal skin rashes Lymphatics: Denies new  lymphadenopathy or easy bruising Neurological:Denies numbness, tingling or new weaknesses Behavioral/Psych: Mood is stable, no new changes  Breast:  denies any pain or lumps or nodules in either breasts All other systems were reviewed with the patient and are negative.  I have reviewed the past medical history, past surgical history, social history and family history with the patient and they are unchanged from previous note.  ALLERGIES:  has No Known Allergies.  MEDICATIONS:  Current Outpatient Prescriptions  Medication Sig Dispense Refill  . albuterol (PROVENTIL HFA;VENTOLIN HFA) 108 (90 BASE) MCG/ACT inhaler Inhale 2 puffs into the lungs every 6 (six) hours as needed for wheezing or shortness of breath. 1 Inhaler 0  . allopurinol (ZYLOPRIM) 100 MG tablet TAKE ONE TABLET BY MOUTH TWICE DAILY 60 tablet 1  . aspirin 81 MG tablet Take 81 mg by mouth daily.    Marland Kitchen azithromycin (ZITHROMAX) 250 MG tablet 1 tab po x 4 days 4 tablet 0  . benzonatate (TESSALON) 200 MG capsule Take 1 capsule (200 mg total) by mouth 3 (three) times daily as needed for cough. 30 capsule 0  . dexamethasone (DECADRON) 4 MG tablet Take 2 tablets by mouth once a day on the day after chemotherapy and then take 2 tablets two times a day for 2 days. Take with food. 30 tablet 1  . lidocaine-prilocaine (EMLA) cream Apply to affected area once 30 g 3  . lisinopril-hydrochlorothiazide (PRINZIDE,ZESTORETIC) 10-12.5 MG per tablet TAKE ONE TABLET BY MOUTH ONCE DAILY 90 tablet 1  . LORazepam (ATIVAN) 0.5 MG tablet Take 1 tablet (0.5 mg total) by mouth every 6 (six) hours as needed (Nausea or vomiting). 30 tablet 0  . ondansetron (ZOFRAN) 8 MG tablet Take 1 tablet (  8 mg total) by mouth 2 (two) times daily as needed. Start on the third day after chemotherapy. 30 tablet 1  . prochlorperazine (COMPAZINE) 10 MG tablet Take 1 tablet (10 mg total) by mouth every 6 (six) hours as needed (Nausea or vomiting). 30 tablet 1   No current  facility-administered medications for this visit.   Facility-Administered Medications Ordered in Other Visits  Medication Dose Route Frequency Provider Last Rate Last Dose  . cyclophosphamide (CYTOXAN) 1,340 mg in sodium chloride 0.9 % 250 mL chemo infusion  600 mg/m2 (Treatment Plan Actual) Intravenous Once Nicholas Lose, MD      . DOXOrubicin (ADRIAMYCIN) chemo injection 134 mg  60 mg/m2 (Treatment Plan Actual) Intravenous Once Nicholas Lose, MD   134 mg at 03/30/15 1615  . heparin lock flush 100 unit/mL  500 Units Intracatheter Once PRN Nicholas Lose, MD      . pegfilgrastim (NEULASTA ONPRO KIT) injection 6 mg  6 mg Subcutaneous Once Nicholas Lose, MD      . sodium chloride 0.9 % injection 10 mL  10 mL Intracatheter PRN Nicholas Lose, MD        PHYSICAL EXAMINATION: ECOG PERFORMANCE STATUS: 1 - Symptomatic but completely ambulatory  Filed Vitals:   03/30/15 1409  BP: 134/66  Pulse: 79  Temp: 98.3 F (36.8 C)  Resp: 18   Filed Weights   03/30/15 1409  Weight: 235 lb 12.8 oz (106.958 kg)    GENERAL:alert, no distress and comfortable SKIN: skin color, texture, turgor are normal, no rashes or significant lesions EYES: normal, Conjunctiva are pink and non-injected, sclera clear OROPHARYNX:no exudate, no erythema and lips, buccal mucosa, and tongue normal  NECK: supple, thyroid normal size, non-tender, without nodularity LYMPH:  no palpable lymphadenopathy in the cervical, axillary or inguinal LUNGS: clear to auscultation and percussion with normal breathing effort HEART: regular rate & rhythm and no murmurs and no lower extremity edema ABDOMEN:abdomen soft, non-tender and normal bowel sounds Musculoskeletal:no cyanosis of digits and no clubbing  NEURO: alert & oriented x 3 with fluent speech, no focal motor/sensory deficits  LABORATORY DATA:  I have reviewed the data as listed   Chemistry      Component Value Date/Time   NA 139 03/30/2015 1329   NA 136 03/14/2015 1200   K 4.2  03/30/2015 1329   K 4.9 03/14/2015 1200   CL 102 03/14/2015 1200   CO2 28 03/30/2015 1329   CO2 26 03/14/2015 1200   BUN 18.2 03/30/2015 1329   BUN 19 03/14/2015 1200   CREATININE 1.1 03/30/2015 1329   CREATININE 1.18* 03/14/2015 1200      Component Value Date/Time   CALCIUM 9.3 03/30/2015 1329   CALCIUM 9.6 03/14/2015 1200   ALKPHOS 136 03/30/2015 1329   ALKPHOS 94 02/23/2014 1436   AST 28 03/30/2015 1329   AST 25 02/23/2014 1436   ALT 31 03/30/2015 1329   ALT 23 02/23/2014 1436   BILITOT 0.85 03/30/2015 1329   BILITOT 0.8 02/23/2014 1436       Lab Results  Component Value Date   WBC 5.7 03/30/2015   HGB 10.5* 03/30/2015   HCT 32.3* 03/30/2015   MCV 88.5 03/30/2015   PLT 348 03/30/2015   NEUTROABS 3.2 03/30/2015     RADIOGRAPHIC STUDIES: I have personally reviewed the radiology reports and agreed with their findings. CT chest abdomen pelvis and bone scan 03/16/2014 and normal Chest x-ray showed nodular pneumonitis  ASSESSMENT & PLAN:  Left breast invasive mammary cancer 1:00  position: 3.1 x 2.9 x 3.6 cm tumor with multiple enlarged level MCCXIII left axillary lymph nodes largest measuring 4 cm per total congruent nor mass measures 10.2 cm ER 12%, PR 0%, Ki-67 100%, HER-2 negative ratio 1.53  Treatment plan: neoadjuvant chemotherapy with dose dense Adriamycin and Cytoxan followed by Abraxane weekly 12. Current treatment: Today cycle 1 day 1 of Adriamycin and Cytoxan  CT chest abdomen pelvis and bone scan 03/17/2015: No evidence of metastatic disease.  Pneumonia: Patient was treated with azithromycin as well as a dose of Rocephin was given by her primary care physician. She had a repeat chest x-ray which did not show any major improvement in these lung findings. I reviewed the x-rays with the patient. She is afebrile but otherwise doing well. Hence I recommended initiating chemotherapy today.  Return to clinic in 1 week for toxicity check and follow   No orders of  the defined types were placed in this encounter.   The patient has a good understanding of the overall plan. she agrees with it. She will call with any problems that may develop before her next visit here.   Rulon Eisenmenger, MD

## 2015-03-30 NOTE — Patient Instructions (Signed)
[facility] Discharge Instructions for Patients Receiving Chemotherapy  Today you received the following chemotherapy agents adriamycin and cytoxan.  To help prevent nausea and vomiting after your treatment, we encourage you to take your nausea medication compazine until Sunday and then you can use zofran.  You may also use ativan.   If you develop nausea and vomiting that is not controlled by your nausea medication, call the clinic.   BELOW ARE SYMPTOMS THAT SHOULD BE REPORTED IMMEDIATELY:  *FEVER GREATER THAN 100.5 F  *CHILLS WITH OR WITHOUT FEVER  NAUSEA AND VOMITING THAT IS NOT CONTROLLED WITH YOUR NAUSEA MEDICATION  *UNUSUAL SHORTNESS OF BREATH  *UNUSUAL BRUISING OR BLEEDING  TENDERNESS IN MOUTH AND THROAT WITH OR WITHOUT PRESENCE OF ULCERS  *URINARY PROBLEMS  *BOWEL PROBLEMS  UNUSUAL RASH Items with * indicate a potential emergency and should be followed up as soon as possible.  Feel free to call the clinic you have any questions or concerns. The clinic phone number is [phone].  Please show the [place] at check-in to the Emergency Department and triage nurse.

## 2015-03-30 NOTE — Progress Notes (Signed)
I faxed prio auth form to [facility] for ondansetron [facility] 8mg  tablets [phone]

## 2015-03-30 NOTE — Progress Notes (Signed)
Patient and husband watched video for neulasta on-body injector.

## 2015-03-31 ENCOUNTER — Encounter: Payer: Self-pay | Admitting: Hematology and Oncology

## 2015-03-31 NOTE — Progress Notes (Signed)
I refaxed form again to [facility]   [phone]

## 2015-04-05 ENCOUNTER — Encounter: Payer: Medicare Other | Admitting: Family Medicine

## 2015-04-05 NOTE — Assessment & Plan Note (Signed)
Left breast invasive mammary cancer 1:00 position: 3.1 x 2.9 x 3.6 cm tumor with multiple enlarged level MCCXIII left axillary lymph nodes largest measuring 4 cm per total congruent nor mass measures 10.2 cm ER 12%, PR 0%, Ki-67 100%, HER-2 negative ratio 1.53  Treatment plan: neoadjuvant chemotherapy with dose dense Adriamycin and Cytoxan followed by Abraxane weekly 12. Current treatment: Today cycle 1 day 8 of Adriamycin and Cytoxan  CT chest abdomen pelvis and bone scan 03/17/2015: No evidence of metastatic disease.  Pneumonia: Patient was treated with azithromycin as well as a dose of Rocephin was given by her primary care physician.   Return to clinic in 1 week for cycle 2 of [facility]

## 2015-04-06 ENCOUNTER — Other Ambulatory Visit (HOSPITAL_BASED_OUTPATIENT_CLINIC_OR_DEPARTMENT_OTHER): Payer: Medicare Other

## 2015-04-06 ENCOUNTER — Telehealth: Payer: Self-pay | Admitting: Hematology and Oncology

## 2015-04-06 ENCOUNTER — Other Ambulatory Visit (HOSPITAL_COMMUNITY)
Admission: AD | Admit: 2015-04-06 | Discharge: 2015-04-06 | Disposition: A | Discharge status: Home / Self Care | Payer: Medicare Other | Source: Ambulatory Visit | Attending: Hematology and Oncology | Admitting: Hematology and Oncology

## 2015-04-06 ENCOUNTER — Ambulatory Visit (HOSPITAL_BASED_OUTPATIENT_CLINIC_OR_DEPARTMENT_OTHER): Payer: Medicare Other | Admitting: Hematology and Oncology

## 2015-04-06 VITALS — BP 135/72 | HR 84 | Temp 98.2°F | Resp 18 | Ht 65.0 in | Wt 235.9 lb

## 2015-04-06 DIAGNOSIS — C773 Secondary and unspecified malignant neoplasm of axilla and upper limb lymph nodes: Secondary | ICD-10-CM | POA: Diagnosis not present

## 2015-04-06 DIAGNOSIS — C50412 Malignant neoplasm of upper-outer quadrant of left female breast: Secondary | ICD-10-CM

## 2015-04-06 DIAGNOSIS — D701 Agranulocytosis secondary to cancer chemotherapy: Secondary | ICD-10-CM | POA: Diagnosis not present

## 2015-04-06 DIAGNOSIS — B379 Candidiasis, unspecified: Secondary | ICD-10-CM | POA: Diagnosis not present

## 2015-04-06 LAB — COMPREHENSIVE METABOLIC PANEL
ALT: 24 U/L (ref 0–35)
ANION GAP: 6 (ref 5–15)
AST: 17 U/L (ref 0–37)
Albumin: 3.3 g/dL — ABNORMAL LOW (ref 3.5–5.2)
Alkaline Phosphatase: 94 U/L (ref 39–117)
BUN: 38 mg/dL — ABNORMAL HIGH (ref 6–23)
CO2: 26 mmol/L (ref 19–32)
Calcium: 9 mg/dL (ref 8.4–10.5)
Chloride: 101 mmol/L (ref 96–112)
Creatinine, Ser: 1.3 mg/dL — ABNORMAL HIGH (ref 0.50–1.10)
GFR calc Af Amer: 48 mL/min — ABNORMAL LOW (ref >90)
GFR calc non Af Amer: 42 mL/min — ABNORMAL LOW (ref >90)
GLUCOSE: 104 mg/dL — AB (ref 70–99)
Potassium: 5.3 mmol/L — ABNORMAL HIGH (ref 3.5–5.1)
SODIUM: 133 mmol/L — AB (ref 135–145)
Total Bilirubin: 1 mg/dL (ref 0.3–1.2)
Total Protein: 7 g/dL (ref 6.0–8.3)

## 2015-04-06 LAB — CBC WITH DIFFERENTIAL/PLATELET
BASO%: 1 % (ref 0.0–2.0)
Basophils Absolute: 0 10*3/uL (ref 0.0–0.1)
EOS%: 3.1 % (ref 0.0–7.0)
Eosinophils Absolute: 0 10*3/uL (ref 0.0–0.5)
HCT: 29.8 % — ABNORMAL LOW (ref 34.8–46.6)
HGB: 9.6 g/dL — ABNORMAL LOW (ref 11.6–15.9)
LYMPH#: 0.7 10*3/uL — AB (ref 0.9–3.3)
LYMPH%: 74 % — ABNORMAL HIGH (ref 14.0–49.7)
MCH: 28.7 pg (ref 25.1–34.0)
MCHC: 32.2 g/dL (ref 31.5–36.0)
MCV: 89 fL (ref 79.5–101.0)
MONO#: 0 10*3/uL — AB (ref 0.1–0.9)
MONO%: 2.1 % (ref 0.0–14.0)
NEUT#: 0.2 10*3/uL — CL (ref 1.5–6.5)
NEUT%: 19.8 % — ABNORMAL LOW (ref 38.4–76.8)
Platelets: 187 10*3/uL (ref 145–400)
RBC: 3.35 10*6/uL — ABNORMAL LOW (ref 3.70–5.45)
RDW: 14.2 % (ref 11.2–14.5)
WBC: 1 10*3/uL — AB (ref 3.9–10.3)

## 2015-04-06 MED ORDER — NYSTATIN 100000 UNIT/ML MT SUSP: 5.0000 mL | Freq: Four times a day (QID) | OROMUCOSAL | Status: DC

## 2015-04-06 NOTE — Telephone Encounter (Signed)
per pof ot sch pt appt-gave pt copy of sch °

## 2015-04-06 NOTE — Progress Notes (Signed)
Patient Care Team: Midge Minium, MD as PCP - General (Family Medicine) Meriam Sprague Saguier, PA-C as Physician Assistant (Physician Assistant)  DIAGNOSIS: No matching staging information was found for the patient.  SUMMARY OF ONCOLOGIC HISTORY:   Breast cancer of upper-outer quadrant of left female breast   03/02/2015 Initial Diagnosis Left breast biopsy 1:00: Invasive mammary cancer, grade 3, 1 lymph node biopsy in the axilla positive, ER 12%, PR 0%, Ki-67 100%, HER-2 negative ratio 1.53   03/08/2015 Breast MRI Left breast 1:00 position: 3.6 cm mass with enlarged axillary lymph nodes multiple levels largest 4 cm, total mass measures 10.2 cm   03/30/2015 -  Neo-Adjuvant Chemotherapy Adriamycin Cytoxan 4 followed by Abraxane weekly 12    CHIEF COMPLIANT: Cycle 1 day a toxicity check for Adriamycin and Cytoxan  INTERVAL HISTORY: Cassandra Maldonado is a 69 year old with above-mentioned history of left breast cancer currently on neoadjuvant chemotherapy with Adriamycin and Cytoxan. She tolerated cycle 1 fairly well except for oral thrush did not have much nausea or vomiting. She took Ativan which helped her to sleep very well. She has been working on and off. She does complain of fatigue. She works at her husbands [employer] works [employer].  REVIEW OF SYSTEMS:   Constitutional: Denies fevers, chills or abnormal weight loss Eyes: Denies blurriness of vision Ears, nose, mouth, throat, and face: Denies mucositis or sore throat Respiratory: Denies cough, dyspnea or wheezes Cardiovascular: Denies palpitation, chest discomfort or lower extremity swelling Gastrointestinal:  Denies nausea, heartburn or change in bowel habits Skin: Denies abnormal skin rashes Lymphatics: Denies new lymphadenopathy or easy bruising Neurological:Denies numbness, tingling or new weaknesses Behavioral/Psych: Mood is stable, no new changes  All other systems were reviewed with the patient and are  negative.  I have reviewed the past medical history, past surgical history, social history and family history with the patient and they are unchanged from previous note.  ALLERGIES:  has No Known Allergies.  MEDICATIONS:  Current Outpatient Prescriptions  Medication Sig Dispense Refill  . albuterol (PROVENTIL HFA;VENTOLIN HFA) 108 (90 BASE) MCG/ACT inhaler Inhale 2 puffs into the lungs every 6 (six) hours as needed for wheezing or shortness of breath. 1 Inhaler 0  . allopurinol (ZYLOPRIM) 100 MG tablet TAKE ONE TABLET BY MOUTH TWICE DAILY 60 tablet 1  . benzonatate (TESSALON) 200 MG capsule Take 1 capsule (200 mg total) by mouth 3 (three) times daily as needed for cough. 30 capsule 0  . dexamethasone (DECADRON) 4 MG tablet Take 2 tablets by mouth once a day on the day after chemotherapy and then take 2 tablets two times a day for 2 days. Take with food. 30 tablet 1  . lidocaine-prilocaine (EMLA) cream Apply to affected area once 30 g 3  . lisinopril-hydrochlorothiazide (PRINZIDE,ZESTORETIC) 10-12.5 MG per tablet TAKE ONE TABLET BY MOUTH ONCE DAILY 90 tablet 1  . LORazepam (ATIVAN) 0.5 MG tablet Take 1 tablet (0.5 mg total) by mouth every 6 (six) hours as needed (Nausea or vomiting). 30 tablet 0  . ondansetron (ZOFRAN) 8 MG tablet Take 1 tablet (8 mg total) by mouth 2 (two) times daily as needed. Start on the third day after chemotherapy. 30 tablet 1  . prochlorperazine (COMPAZINE) 10 MG tablet Take 1 tablet (10 mg total) by mouth every 6 (six) hours as needed (Nausea or vomiting). 30 tablet 1  . nystatin (MYCOSTATIN) 100000 UNIT/ML suspension Take 5 mLs (500,000 Units total) by mouth 4 (four) times daily. 60 mL 0  No current facility-administered medications for this visit.    PHYSICAL EXAMINATION: ECOG PERFORMANCE STATUS: 1 - Symptomatic but completely ambulatory  Filed Vitals:   04/06/15 0814  BP: 135/72  Pulse: 84  Temp: 98.2 F (36.8 C)  Resp: 18   Filed Weights   04/06/15  0814  Weight: 235 lb 14.4 oz (107.004 kg)    GENERAL:alert, no distress and comfortable SKIN: skin color, texture, turgor are normal, no rashes or significant lesions EYES: normal, Conjunctiva are pink and non-injected, sclera clear OROPHARYNX: Thrush on the tongue NECK: supple, thyroid normal size, non-tender, without nodularity LYMPH:  no palpable lymphadenopathy in the cervical, axillary or inguinal LUNGS: clear to auscultation and percussion with normal breathing effort HEART: regular rate & rhythm and no murmurs and no lower extremity edema ABDOMEN:abdomen soft, non-tender and normal bowel sounds Musculoskeletal:no cyanosis of digits and no clubbing  NEURO: alert & oriented x 3 with fluent speech, no focal motor/sensory deficits   LABORATORY DATA:  I have reviewed the data as listed   Chemistry      Component Value Date/Time   NA 139 03/30/2015 1329   NA 136 03/14/2015 1200   K 4.2 03/30/2015 1329   K 4.9 03/14/2015 1200   CL 102 03/14/2015 1200   CO2 28 03/30/2015 1329   CO2 26 03/14/2015 1200   BUN 18.2 03/30/2015 1329   BUN 19 03/14/2015 1200   CREATININE 1.1 03/30/2015 1329   CREATININE 1.18* 03/14/2015 1200      Component Value Date/Time   CALCIUM 9.3 03/30/2015 1329   CALCIUM 9.6 03/14/2015 1200   ALKPHOS 136 03/30/2015 1329   ALKPHOS 94 02/23/2014 1436   AST 28 03/30/2015 1329   AST 25 02/23/2014 1436   ALT 31 03/30/2015 1329   ALT 23 02/23/2014 1436   BILITOT 0.85 03/30/2015 1329   BILITOT 0.8 02/23/2014 1436       Lab Results  Component Value Date   WBC 1.0* 04/06/2015   HGB 9.6* 04/06/2015   HCT 29.8* 04/06/2015   MCV 89.0 04/06/2015   PLT 187 04/06/2015   NEUTROABS 0.2* 04/06/2015    ASSESSMENT & PLAN:  Breast cancer of upper-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 Current treatment: Today cycle 1 day 8 of Adriamycin and Cytoxan  CT chest abdomen pelvis and bone scan 03/17/2015: No evidence of metastatic disease.  Pneumonia: Patient was treated with azithromycin as well as a dose of Rocephin was given by her primary care physician.  Chemotherapy toxicities: 1. Severe neutropenia nadir counts: Decrease the dosage of chemotherapy for cycle 2 Adriamycin and Cytoxan 2. Thrush prescribed nystatin Provided prescription for cranial prosthesis  Return to clinic in 1 week for cycle 2 of AC     Orders Placed This Encounter  Procedures  . CBC with Differential    Standing Status: Future     Number of Occurrences:      Standing Expiration Date: 04/05/2016  . Comprehensive metabolic panel (Cmet) - [facility]    Standing Status: Future     Number of Occurrences:      Standing Expiration Date: 04/05/2016   The patient has a  good understanding of the overall plan. she agrees with it. She will call with any problems that may develop before her next visit here.   Rulon Eisenmenger, MD

## 2015-04-10 ENCOUNTER — Telehealth: Payer: Self-pay

## 2015-04-10 NOTE — Telephone Encounter (Signed)
Chemo follow up call not done.  Desk RN not aware it was desk responsibiltiy.  Pt seen in clinic on 4/14.

## 2015-04-10 NOTE — Telephone Encounter (Signed)
-----   Message from Ignacia Felling, RN sent at 03/30/2015  3:34 PM EDT ----- Regarding: chemo follow up call  Dr. Yvette Rack 1st A/C  Pt. Phone [phone]

## 2015-04-12 NOTE — Assessment & Plan Note (Signed)
Left breast invasive mammary cancer 1:00 position: 3.1 x 2.9 x 3.6 cm tumor with multiple enlarged level MCCXIII left axillary lymph nodes largest measuring 4 cm per total congruent nor mass measures 10.2 cm ER 12%, PR 0%, Ki-67 100%, HER-2 negative ratio 1.53  Treatment plan: neoadjuvant chemotherapy with dose dense Adriamycin and Cytoxan followed by Abraxane weekly 12. Current treatment: Today cycle 2 day 1 of Adriamycin and Cytoxan  CT chest abdomen pelvis and bone scan 03/17/2015: No evidence of metastatic disease.   Chemotherapy toxicities: 1. Severe neutropenia nadir counts: Decrease the dosage of chemotherapy for cycle 2 Adriamycin and Cytoxan 2. Thrush prescribed nystatin  Return to clinic in 2 week for cycle 3 of AC

## 2015-04-13 ENCOUNTER — Other Ambulatory Visit (HOSPITAL_BASED_OUTPATIENT_CLINIC_OR_DEPARTMENT_OTHER): Payer: Medicare Other

## 2015-04-13 ENCOUNTER — Other Ambulatory Visit: Payer: Medicare Other

## 2015-04-13 ENCOUNTER — Ambulatory Visit (HOSPITAL_BASED_OUTPATIENT_CLINIC_OR_DEPARTMENT_OTHER): Payer: Medicare Other

## 2015-04-13 ENCOUNTER — Telehealth: Payer: Self-pay | Admitting: Hematology and Oncology

## 2015-04-13 ENCOUNTER — Ambulatory Visit (HOSPITAL_BASED_OUTPATIENT_CLINIC_OR_DEPARTMENT_OTHER): Payer: Medicare Other | Admitting: Hematology and Oncology

## 2015-04-13 VITALS — BP 122/60 | HR 109 | Temp 98.2°F | Resp 18 | Ht 65.0 in | Wt 234.4 lb

## 2015-04-13 DIAGNOSIS — D709 Neutropenia, unspecified: Secondary | ICD-10-CM | POA: Diagnosis not present

## 2015-04-13 DIAGNOSIS — C50412 Malignant neoplasm of upper-outer quadrant of left female breast: Secondary | ICD-10-CM

## 2015-04-13 DIAGNOSIS — C773 Secondary and unspecified malignant neoplasm of axilla and upper limb lymph nodes: Secondary | ICD-10-CM

## 2015-04-13 DIAGNOSIS — Z5111 Encounter for antineoplastic chemotherapy: Secondary | ICD-10-CM

## 2015-04-13 DIAGNOSIS — K5900 Constipation, unspecified: Secondary | ICD-10-CM | POA: Diagnosis not present

## 2015-04-13 DIAGNOSIS — B379 Candidiasis, unspecified: Secondary | ICD-10-CM

## 2015-04-13 LAB — COMPREHENSIVE METABOLIC PANEL (CC13)
ALT: 24 U/L (ref 0–55)
AST: 16 U/L (ref 5–34)
Albumin: 2.9 g/dL — ABNORMAL LOW (ref 3.5–5.0)
Alkaline Phosphatase: 153 U/L — ABNORMAL HIGH (ref 40–150)
Anion Gap: 13 mEq/L — ABNORMAL HIGH (ref 3–11)
BILIRUBIN TOTAL: 0.29 mg/dL (ref 0.20–1.20)
BUN: 19 mg/dL (ref 7.0–26.0)
CO2: 24 mEq/L (ref 22–29)
CREATININE: 1.1 mg/dL (ref 0.6–1.1)
Calcium: 9.7 mg/dL (ref 8.4–10.4)
Chloride: 99 mEq/L (ref 98–109)
EGFR: 51 mL/min/{1.73_m2} — ABNORMAL LOW (ref >90)
Glucose: 127 mg/dl (ref 70–140)
Potassium: 5.3 mEq/L — ABNORMAL HIGH (ref 3.5–5.1)
Sodium: 137 mEq/L (ref 136–145)
Total Protein: 7.2 g/dL (ref 6.4–8.3)

## 2015-04-13 LAB — CBC WITH DIFFERENTIAL/PLATELET
BASO%: 0.3 % (ref 0.0–2.0)
BASOS ABS: 0 10*3/uL (ref 0.0–0.1)
EOS ABS: 0 10*3/uL (ref 0.0–0.5)
EOS%: 0.2 % (ref 0.0–7.0)
HEMATOCRIT: 28.6 % — AB (ref 34.8–46.6)
HEMOGLOBIN: 9.3 g/dL — AB (ref 11.6–15.9)
LYMPH%: 12.7 % — ABNORMAL LOW (ref 14.0–49.7)
MCH: 28.9 pg (ref 25.1–34.0)
MCHC: 32.5 g/dL (ref 31.5–36.0)
MCV: 88.8 fL (ref 79.5–101.0)
MONO#: 1.3 10*3/uL — AB (ref 0.1–0.9)
MONO%: 12.9 % (ref 0.0–14.0)
NEUT%: 73.9 % (ref 38.4–76.8)
NEUTROS ABS: 7.3 10*3/uL — AB (ref 1.5–6.5)
PLATELETS: 272 10*3/uL (ref 145–400)
RBC: 3.22 10*6/uL — ABNORMAL LOW (ref 3.70–5.45)
RDW: 14.5 % (ref 11.2–14.5)
WBC: 9.9 10*3/uL (ref 3.9–10.3)
lymph#: 1.3 10*3/uL (ref 0.9–3.3)

## 2015-04-13 MED ORDER — DOXORUBICIN HCL CHEMO IV INJECTION 2 MG/ML
50.0000 mg/m2 | Freq: Once | INTRAVENOUS | Status: AC
  Administered 2015-04-13: 112 mg via INTRAVENOUS
  Filled 2015-04-13: qty 56

## 2015-04-13 MED ORDER — PEGFILGRASTIM 6 MG/0.6ML ~~LOC~~ PSKT
6.0000 mg | PREFILLED_SYRINGE | Freq: Once | SUBCUTANEOUS | Status: AC
  Administered 2015-04-13: 6 mg via SUBCUTANEOUS
  Filled 2015-04-13: qty 0.6

## 2015-04-13 MED ORDER — SODIUM CHLORIDE 0.9 % IV SOLN
Freq: Once | INTRAVENOUS | Status: AC
  Administered 2015-04-13: 14:00:00 via INTRAVENOUS
  Filled 2015-04-13: qty 5

## 2015-04-13 MED ORDER — HEPARIN SOD (PORK) LOCK FLUSH 100 UNIT/ML IV SOLN
500.0000 [IU] | Freq: Once | INTRAVENOUS | Status: AC | PRN
  Administered 2015-04-13: 500 [IU]
  Filled 2015-04-13: qty 5

## 2015-04-13 MED ORDER — SODIUM CHLORIDE 0.9 % IV SOLN
500.0000 mg/m2 | Freq: Once | INTRAVENOUS | Status: AC
  Administered 2015-04-13: 1120 mg via INTRAVENOUS
  Filled 2015-04-13: qty 56

## 2015-04-13 MED ORDER — PALONOSETRON HCL INJECTION 0.25 MG/5ML
INTRAVENOUS | Status: AC
  Filled 2015-04-13: qty 5

## 2015-04-13 MED ORDER — SODIUM CHLORIDE 0.9 % IV SOLN
Freq: Once | INTRAVENOUS | Status: AC
  Administered 2015-04-13: 14:00:00 via INTRAVENOUS

## 2015-04-13 MED ORDER — PALONOSETRON HCL INJECTION 0.25 MG/5ML
0.2500 mg | Freq: Once | INTRAVENOUS | Status: AC
  Administered 2015-04-13: 0.25 mg via INTRAVENOUS

## 2015-04-13 MED ORDER — SODIUM CHLORIDE 0.9 % IJ SOLN
10.0000 mL | INTRAMUSCULAR | Status: DC | PRN
  Administered 2015-04-13: 10 mL
  Filled 2015-04-13: qty 10

## 2015-04-13 NOTE — Patient Instructions (Signed)
[facility] Discharge Instructions for Patients Receiving Chemotherapy  Today you received the following chemotherapy agents: Adriamycin, Cytoxan  To help prevent nausea and vomiting after your treatment, we encourage you to take your nausea medication as prescribed by your physician.   If you develop nausea and vomiting that is not controlled by your nausea medication, call the clinic.   BELOW ARE SYMPTOMS THAT SHOULD BE REPORTED IMMEDIATELY:  *FEVER GREATER THAN 100.5 F  *CHILLS WITH OR WITHOUT FEVER  NAUSEA AND VOMITING THAT IS NOT CONTROLLED WITH YOUR NAUSEA MEDICATION  *UNUSUAL SHORTNESS OF BREATH  *UNUSUAL BRUISING OR BLEEDING  TENDERNESS IN MOUTH AND THROAT WITH OR WITHOUT PRESENCE OF ULCERS  *URINARY PROBLEMS  *BOWEL PROBLEMS  UNUSUAL RASH Items with * indicate a potential emergency and should be followed up as soon as possible.  Feel free to call the clinic you have any questions or concerns. The clinic phone number is [phone].  Please show the [place] at check-in to the Emergency Department and triage nurse.

## 2015-04-13 NOTE — Telephone Encounter (Signed)
Gave patient avs report and appointments for May. Due to lab and provider availability lab/VG/tx scheduled for 5/6 instead of 5/5 - desk nurse aware.

## 2015-04-13 NOTE — Progress Notes (Signed)
Patient Care Team: Midge Minium, MD as PCP - General (Family Medicine) Meriam Sprague Saguier, PA-C as Physician Assistant (Physician Assistant)  DIAGNOSIS: No matching staging information was found for the patient.  SUMMARY OF ONCOLOGIC HISTORY:   Breast cancer of upper-outer quadrant of left female breast   03/02/2015 Initial Diagnosis Left breast biopsy 1:00: Invasive mammary cancer, grade 3, 1 lymph node biopsy in the axilla positive, ER 12%, PR 0%, Ki-67 100%, HER-2 negative ratio 1.53   03/08/2015 Breast MRI Left breast 1:00 position: 3.6 cm mass with enlarged axillary lymph nodes multiple levels largest 4 cm, total mass measures 10.2 cm   03/30/2015 -  Neo-Adjuvant Chemotherapy Adriamycin Cytoxan 4 followed by Abraxane weekly 12    CHIEF COMPLIANT: cycle 2 day 1 Adriamycin and Cytoxan  INTERVAL HISTORY: Cassandra Maldonado is a 69 year old lady with above-mentioned history of left cerebrovascular currently on neoadjuvant chemotherapy and today is cycle 2 of Adriamycin and Cytoxan. She had thrush after cycle 1 which was treated with nystatin. Her symptoms have improved. She denies any fevers or chills. Denies any nausea or vomiting.  REVIEW OF SYSTEMS:   Constitutional: Denies fevers, chills or abnormal weight loss Eyes: Denies blurriness of vision Ears, nose, mouth, throat, and face: thrush has improved Respiratory: Denies cough, dyspnea or wheezes Cardiovascular: Denies palpitation, chest discomfort or lower extremity swelling Gastrointestinal:  Denies nausea, heartburn or change in bowel habits Skin: Denies abnormal skin rashes Lymphatics: Denies new lymphadenopathy or easy bruising Neurological:Denies numbness, tingling or new weaknesses Behavioral/Psych: Mood is stable, no new changes  All other systems were reviewed with the patient and are negative.  I have reviewed the past medical history, past surgical history, social history and family history with the patient and they are  unchanged from previous note.  ALLERGIES:  has No Known Allergies.  MEDICATIONS:  Current Outpatient Prescriptions  Medication Sig Dispense Refill  . albuterol (PROVENTIL HFA;VENTOLIN HFA) 108 (90 BASE) MCG/ACT inhaler Inhale 2 puffs into the lungs every 6 (six) hours as needed for wheezing or shortness of breath. 1 Inhaler 0  . allopurinol (ZYLOPRIM) 100 MG tablet TAKE ONE TABLET BY MOUTH TWICE DAILY 60 tablet 1  . benzonatate (TESSALON) 200 MG capsule Take 1 capsule (200 mg total) by mouth 3 (three) times daily as needed for cough. 30 capsule 0  . dexamethasone (DECADRON) 4 MG tablet Take 2 tablets by mouth once a day on the day after chemotherapy and then take 2 tablets two times a day for 2 days. Take with food. 30 tablet 1  . lidocaine-prilocaine (EMLA) cream Apply to affected area once 30 g 3  . lisinopril-hydrochlorothiazide (PRINZIDE,ZESTORETIC) 10-12.5 MG per tablet TAKE ONE TABLET BY MOUTH ONCE DAILY 90 tablet 1  . LORazepam (ATIVAN) 0.5 MG tablet Take 1 tablet (0.5 mg total) by mouth every 6 (six) hours as needed (Nausea or vomiting). 30 tablet 0  . nystatin (MYCOSTATIN) 100000 UNIT/ML suspension Take 5 mLs (500,000 Units total) by mouth 4 (four) times daily. 60 mL 0  . ondansetron (ZOFRAN) 8 MG tablet Take 1 tablet (8 mg total) by mouth 2 (two) times daily as needed. Start on the third day after chemotherapy. 30 tablet 1  . prochlorperazine (COMPAZINE) 10 MG tablet Take 1 tablet (10 mg total) by mouth every 6 (six) hours as needed (Nausea or vomiting). 30 tablet 1   No current facility-administered medications for this visit.    PHYSICAL EXAMINATION: ECOG PERFORMANCE STATUS: 1 - Symptomatic but completely ambulatory  There  were no vitals filed for this visit. There were no vitals filed for this visit.  GENERAL:alert, no distress and comfortable SKIN: skin color, texture, turgor are normal, no rashes or significant lesions EYES: normal, Conjunctiva are pink and non-injected,  sclera clear OROPHARYNX:no exudate, no erythema and lips, buccal mucosa, and tongue normal  NECK: supple, thyroid normal size, non-tender, without nodularity LYMPH:  no palpable lymphadenopathy in the cervical, axillary or inguinal LUNGS: clear to auscultation and percussion with normal breathing effort HEART: regular rate & rhythm and no murmurs and no lower extremity edema ABDOMEN:abdomen soft, non-tender and normal bowel sounds Musculoskeletal:no cyanosis of digits and no clubbing  NEURO: alert & oriented x 3 with fluent speech, no focal motor/sensory deficits  LABORATORY DATA:  I have reviewed the data as listed   Chemistry      Component Value Date/Time   NA 133* 04/06/2015 0820   NA 139 03/30/2015 1329   K 5.3* 04/06/2015 0820   K 4.2 03/30/2015 1329   CL 101 04/06/2015 0820   CO2 26 04/06/2015 0820   CO2 28 03/30/2015 1329   BUN 38* 04/06/2015 0820   BUN 18.2 03/30/2015 1329   CREATININE 1.30* 04/06/2015 0820   CREATININE 1.1 03/30/2015 1329      Component Value Date/Time   CALCIUM 9.0 04/06/2015 0820   CALCIUM 9.3 03/30/2015 1329   ALKPHOS 94 04/06/2015 0820   ALKPHOS 136 03/30/2015 1329   AST 17 04/06/2015 0820   AST 28 03/30/2015 1329   ALT 24 04/06/2015 0820   ALT 31 03/30/2015 1329   BILITOT 1.0 04/06/2015 0820   BILITOT 0.85 03/30/2015 1329       Lab Results  Component Value Date   WBC 1.0* 04/06/2015   HGB 9.6* 04/06/2015   HCT 29.8* 04/06/2015   MCV 89.0 04/06/2015   PLT 187 04/06/2015   NEUTROABS 0.2* 04/06/2015   ASSESSMENT & PLAN:  Breast cancer of upper-outer quadrant of left female breast Left breast invasive mammary cancer 1:00 position: 3.1 x 2.9 x 3.6 cm tumor with multiple enlarged level MCCXIII left axillary lymph nodes largest measuring 4 cm per total congruent nor mass measures 10.2 cm ER 12%, PR 0%, Ki-67 100%, HER-2 negative ratio 1.53  Treatment plan: neoadjuvant chemotherapy with dose dense Adriamycin and Cytoxan followed by  Abraxane weekly 12. Current treatment: Today cycle 2 day 1 of Adriamycin and Cytoxan  CT chest abdomen pelvis and bone scan 03/17/2015: No evidence of metastatic disease.  Chemotherapy toxicities: 1. Severe neutropenia nadir counts: Decrease the dosage of chemotherapy for cycle 2 Adriamycin and Cytoxan 2. Thrush prescribed nystatin 3. Constipation: I recommended that she take a stool softener daily and to use Miralax when necessary Monitoring closely for toxicities  Return to clinic in 2 week for cycle 3 of AC  No orders of the defined types were placed in this encounter.   The patient has a good understanding of the overall plan. she agrees with it. She will call with any problems that may develop before her next visit here.   Gudena, Vinay K, MD    

## 2015-04-21 ENCOUNTER — Encounter: Payer: Self-pay | Admitting: Genetic Counselor

## 2015-04-21 ENCOUNTER — Telehealth: Payer: Self-pay | Admitting: Genetic Counselor

## 2015-04-21 DIAGNOSIS — C50412 Malignant neoplasm of upper-outer quadrant of left female breast: Secondary | ICD-10-CM

## 2015-04-21 DIAGNOSIS — Z1379 Encounter for other screening for genetic and chromosomal anomalies: Secondary | ICD-10-CM

## 2015-04-21 DIAGNOSIS — Z8041 Family history of malignant neoplasm of ovary: Secondary | ICD-10-CM

## 2015-04-21 NOTE — Progress Notes (Signed)
HPI: Ms. Balla was previously seen in the [facility] clinic due to a family of cancer and concerns regarding a hereditary predisposition to cancer. Please refer to our prior cancer genetics clinic note for more information regarding Ms. Elenes's medical, social and family histories, and our assessment and recommendations, at the time. Ms. Asher recent genetic test results were disclosed to her, as were recommendations warranted by these results. These results and recommendations are discussed in more detail below.   GENETIC TEST RESULTS: At the time of Ms. Michelotti's visit, we recommended she pursue genetic testing of the Breast/Ovarian cancer panel. The Breast/Ovarian gene panel offered by [facility] includes sequencing and rearrangement analysis for the following 21 genes:  ATM, BARD1, BRCA1, BRCA2, BRIP1, CDH1, CHEK2, EPCAM, FANCC, MLH1, MSH2, MSH6, NBN, PALB2, PMS2, PTEN, RAD51C, RAD51D, STK11, TP53, and XRCC2.   Ms. Stapp was called today with her genetic test results. 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 A copy of the test report has been scanned into Epic for review.   Genetic testing did detect a Variant of Unknown Significance in the MSH2 gene called c.815C>T. At this time, it is unknown if this variant is associated with increased cancer risk or if this is a normal finding, but most variants such as this get reclassified to being inconsequential. It should not be used to make medical management decisions. With time, we suspect the lab will determine the significance of this variant, if any. If we do learn more about it, we will try to contact Ms. Marengo to discuss it further. However, it is important to stay in touch with [place] periodically and keep the address and phone number up to date.   SCREENING RECOMMENDATIONS: We discussed the implications of a BRIP1 mutation for Ms. Nest, and discussed who else in the family should have genetic testing.  We discussed the increased risk for ovarian cancer and that per NCCN guidelines, risk reducing BSO is recommended.  BRIP1 mutations can increase the risk for breast cancer, however, there are not specific recommendations put in place by the NCCN for mastectomy or MRI.  We discussed that her brothers and sons have a 50% chance of having this mutation, and the children of her sister would also be at risk.  FAMILY MEMBERS: Since we now know the mutation in Ms. Neukam, we can test at-risk relatives to determine whether or not they have inherited the mutation and are at increased risk for cancer. We will be happy to meet with any of the family members or refer them to a genetic counselor in their local area. To locate genetic counselors in other cities, individuals can visit the website of the [employer] ([employer]) and Secretary/administrator for a Social worker by zip code.   We strongly encouraged Ms. Fiorella to remain in contact with [place] in cancer genetics on an annual basis so we can update Ms. Forti's personal and family histories, and inform her of advances in cancer genetics that may be of benefit for the entire family. Ms. Sommers knows she is also welcome to call with any questions or concerns, at any time.   Roma Kayser, MS, [facility] Certified Genetic Counselor [phone]

## 2015-04-21 NOTE — Telephone Encounter (Signed)
Called to follow up about setting an appoitment to discuss positive BRIP1 mutation.  She does not feel that there is anything that she can do now regarding management for the mutation.  I explained that her children and niece (daughter of her sister who had peritoneal cancer) may want to get this information.  She did not think that her sons would be interested in genetic testing for the mutation, even though one did have a daughter.  I told her that I would send her result to be scanned into EPIC and will wait to hear from her.  I will complete a documentation note as well.

## 2015-04-28 ENCOUNTER — Other Ambulatory Visit (HOSPITAL_BASED_OUTPATIENT_CLINIC_OR_DEPARTMENT_OTHER): Payer: Medicare Other

## 2015-04-28 ENCOUNTER — Ambulatory Visit (HOSPITAL_BASED_OUTPATIENT_CLINIC_OR_DEPARTMENT_OTHER): Payer: Medicare Other

## 2015-04-28 ENCOUNTER — Ambulatory Visit (HOSPITAL_BASED_OUTPATIENT_CLINIC_OR_DEPARTMENT_OTHER): Payer: Medicare Other | Admitting: Hematology and Oncology

## 2015-04-28 VITALS — HR 99

## 2015-04-28 VITALS — BP 122/62 | HR 104 | Temp 98.4°F | Resp 19 | Ht 65.0 in | Wt 232.0 lb

## 2015-04-28 DIAGNOSIS — Z5189 Encounter for other specified aftercare: Secondary | ICD-10-CM | POA: Diagnosis not present

## 2015-04-28 DIAGNOSIS — D701 Agranulocytosis secondary to cancer chemotherapy: Secondary | ICD-10-CM

## 2015-04-28 DIAGNOSIS — C773 Secondary and unspecified malignant neoplasm of axilla and upper limb lymph nodes: Secondary | ICD-10-CM

## 2015-04-28 DIAGNOSIS — C50412 Malignant neoplasm of upper-outer quadrant of left female breast: Secondary | ICD-10-CM

## 2015-04-28 DIAGNOSIS — B379 Candidiasis, unspecified: Secondary | ICD-10-CM

## 2015-04-28 DIAGNOSIS — Z5111 Encounter for antineoplastic chemotherapy: Secondary | ICD-10-CM | POA: Diagnosis not present

## 2015-04-28 DIAGNOSIS — R5383 Other fatigue: Secondary | ICD-10-CM

## 2015-04-28 LAB — COMPREHENSIVE METABOLIC PANEL (CC13)
ALK PHOS: 124 U/L (ref 40–150)
ALT: 13 U/L (ref 0–55)
ANION GAP: 14 meq/L — AB (ref 3–11)
AST: 11 U/L (ref 5–34)
Albumin: 2.7 g/dL — ABNORMAL LOW (ref 3.5–5.0)
BUN: 23.5 mg/dL (ref 7.0–26.0)
CO2: 23 meq/L (ref 22–29)
CREATININE: 1.1 mg/dL (ref 0.6–1.1)
Calcium: 9.5 mg/dL (ref 8.4–10.4)
Chloride: 101 mEq/L (ref 98–109)
EGFR: 50 mL/min/{1.73_m2} — ABNORMAL LOW (ref >90)
Glucose: 111 mg/dl (ref 70–140)
Potassium: 4.4 mEq/L (ref 3.5–5.1)
Sodium: 138 mEq/L (ref 136–145)
TOTAL PROTEIN: 6.8 g/dL (ref 6.4–8.3)
Total Bilirubin: 0.3 mg/dL (ref 0.20–1.20)

## 2015-04-28 LAB — CBC WITH DIFFERENTIAL/PLATELET
BASO%: 0.7 % (ref 0.0–2.0)
BASOS ABS: 0.1 10*3/uL (ref 0.0–0.1)
EOS%: 0.1 % (ref 0.0–7.0)
Eosinophils Absolute: 0 10*3/uL (ref 0.0–0.5)
HCT: 25.8 % — ABNORMAL LOW (ref 34.8–46.6)
HEMOGLOBIN: 8.3 g/dL — AB (ref 11.6–15.9)
LYMPH%: 7.4 % — ABNORMAL LOW (ref 14.0–49.7)
MCH: 27.8 pg (ref 25.1–34.0)
MCHC: 32.2 g/dL (ref 31.5–36.0)
MCV: 86.5 fL (ref 79.5–101.0)
MONO#: 1.4 10*3/uL — ABNORMAL HIGH (ref 0.1–0.9)
MONO%: 9.1 % (ref 0.0–14.0)
NEUT%: 82.7 % — ABNORMAL HIGH (ref 38.4–76.8)
NEUTROS ABS: 12.3 10*3/uL — AB (ref 1.5–6.5)
Platelets: 332 10*3/uL (ref 145–400)
RBC: 2.99 10*6/uL — ABNORMAL LOW (ref 3.70–5.45)
RDW: 15.4 % — AB (ref 11.2–14.5)
WBC: 14.9 10*3/uL — ABNORMAL HIGH (ref 3.9–10.3)
lymph#: 1.1 10*3/uL (ref 0.9–3.3)

## 2015-04-28 MED ORDER — PALONOSETRON HCL INJECTION 0.25 MG/5ML
INTRAVENOUS | Status: AC
  Filled 2015-04-28: qty 5

## 2015-04-28 MED ORDER — HEPARIN SOD (PORK) LOCK FLUSH 100 UNIT/ML IV SOLN
500.0000 [IU] | Freq: Once | INTRAVENOUS | Status: AC | PRN
  Administered 2015-04-28: 500 [IU]
  Filled 2015-04-28: qty 5

## 2015-04-28 MED ORDER — SODIUM CHLORIDE 0.9 % IV SOLN
Freq: Once | INTRAVENOUS | Status: AC
  Administered 2015-04-28: 14:00:00 via INTRAVENOUS

## 2015-04-28 MED ORDER — FOSAPREPITANT DIMEGLUMINE INJECTION 150 MG
Freq: Once | INTRAVENOUS | Status: AC
  Administered 2015-04-28: 14:00:00 via INTRAVENOUS
  Filled 2015-04-28: qty 5

## 2015-04-28 MED ORDER — SODIUM CHLORIDE 0.9 % IJ SOLN
10.0000 mL | INTRAMUSCULAR | Status: DC | PRN
  Administered 2015-04-28: 10 mL
  Filled 2015-04-28: qty 10

## 2015-04-28 MED ORDER — SODIUM CHLORIDE 0.9 % IV SOLN
500.0000 mg/m2 | Freq: Once | INTRAVENOUS | Status: AC
  Administered 2015-04-28: 1120 mg via INTRAVENOUS
  Filled 2015-04-28: qty 56

## 2015-04-28 MED ORDER — PEGFILGRASTIM 6 MG/0.6ML ~~LOC~~ PSKT
6.0000 mg | PREFILLED_SYRINGE | Freq: Once | SUBCUTANEOUS | Status: AC
  Administered 2015-04-28: 6 mg via SUBCUTANEOUS
  Filled 2015-04-28: qty 0.6

## 2015-04-28 MED ORDER — PALONOSETRON HCL INJECTION 0.25 MG/5ML
0.2500 mg | Freq: Once | INTRAVENOUS | Status: AC
  Administered 2015-04-28: 0.25 mg via INTRAVENOUS

## 2015-04-28 MED ORDER — DOXORUBICIN HCL CHEMO IV INJECTION 2 MG/ML
50.0000 mg/m2 | Freq: Once | INTRAVENOUS | Status: AC
  Administered 2015-04-28: 112 mg via INTRAVENOUS
  Filled 2015-04-28: qty 56

## 2015-04-28 NOTE — Patient Instructions (Signed)
[facility] Discharge Instructions for Patients Receiving Chemotherapy  Today you received the following chemotherapy agents Adriamycin/Cytoxan.  To help prevent nausea and vomiting after your treatment, we encourage you to take your nausea medication as prescribed.    If you develop nausea and vomiting that is not controlled by your nausea medication, call the clinic.   BELOW ARE SYMPTOMS THAT SHOULD BE REPORTED IMMEDIATELY:  *FEVER GREATER THAN 100.5 F  *CHILLS WITH OR WITHOUT FEVER  NAUSEA AND VOMITING THAT IS NOT CONTROLLED WITH YOUR NAUSEA MEDICATION  *UNUSUAL SHORTNESS OF BREATH  *UNUSUAL BRUISING OR BLEEDING  TENDERNESS IN MOUTH AND THROAT WITH OR WITHOUT PRESENCE OF ULCERS  *URINARY PROBLEMS  *BOWEL PROBLEMS  UNUSUAL RASH Items with * indicate a potential emergency and should be followed up as soon as possible.  Feel free to call the clinic you have any questions or concerns. The clinic phone number is [phone].  Please show the CHEMO ALERT CARD at check-in to the Emergency Department and triage nurse.   

## 2015-04-28 NOTE — Progress Notes (Signed)
Patient Care Team: Midge Minium, MD as PCP - General (Family Medicine) Meriam Sprague Saguier, PA-C as Physician Assistant (Physician Assistant)  DIAGNOSIS: No matching staging information was found for the patient.  SUMMARY OF ONCOLOGIC HISTORY:   Breast cancer of upper-outer quadrant of left female breast   03/02/2015 Initial Diagnosis Left breast biopsy 1:00: Invasive mammary cancer, grade 3, 1 lymph node biopsy in the axilla positive, ER 12%, PR 0%, Ki-67 100%, HER-2 negative ratio 1.53   03/08/2015 Breast MRI Left breast 1:00 position: 3.6 cm mass with enlarged axillary lymph nodes multiple levels largest 4 cm, total mass measures 10.2 cm   03/30/2015 -  Neo-Adjuvant Chemotherapy Adriamycin Cytoxan 4 followed by Abraxane weekly 12    CHIEF COMPLIANT: Cycle 3 dose dense Adriamycin Cytoxan  INTERVAL HISTORY: Cassandra Maldonado is a 69 year old lady with above-mentioned history of left breast cancer currently on neoadjuvant chemotherapy with dose dense Adriamycin Cytoxan. Today secondary day 1. She is tolerating treatment fairly well except for mild nausea and fatigue that started about 3 or 4 days after chemotherapy last for 3-4 days and then resolved.  REVIEW OF SYSTEMS:   Constitutional: Denies fevers, chills or abnormal weight loss, complains of fatigue Eyes: Denies blurriness of vision Ears, nose, mouth, throat, and face: Denies mucositis or sore throat Respiratory: Denies cough, dyspnea or wheezes Cardiovascular: Denies palpitation, chest discomfort or lower extremity swelling Gastrointestinal:  Occasional nausea Skin: Denies abnormal skin rashes Lymphatics: Denies new lymphadenopathy or easy bruising Neurological:Denies numbness, tingling or new weaknesses Behavioral/Psych: Mood is stable, no new changes  Breast:  Patient cannot feel the lump in the breast anymore. All other systems were reviewed with the patient and are negative.  I have reviewed the past medical history, past  surgical history, social history and family history with the patient and they are unchanged from previous note.  ALLERGIES:  has No Known Allergies.  MEDICATIONS:  Current Outpatient Prescriptions  Medication Sig Dispense Refill  . albuterol (PROVENTIL HFA;VENTOLIN HFA) 108 (90 BASE) MCG/ACT inhaler Inhale 2 puffs into the lungs every 6 (six) hours as needed for wheezing or shortness of breath. 1 Inhaler 0  . allopurinol (ZYLOPRIM) 100 MG tablet TAKE ONE TABLET BY MOUTH TWICE DAILY 60 tablet 1  . benzonatate (TESSALON) 200 MG capsule Take 1 capsule (200 mg total) by mouth 3 (three) times daily as needed for cough. 30 capsule 0  . dexamethasone (DECADRON) 4 MG tablet Take 2 tablets by mouth once a day on the day after chemotherapy and then take 2 tablets two times a day for 2 days. Take with food. 30 tablet 1  . lidocaine-prilocaine (EMLA) cream Apply to affected area once 30 g 3  . lisinopril-hydrochlorothiazide (PRINZIDE,ZESTORETIC) 10-12.5 MG per tablet TAKE ONE TABLET BY MOUTH ONCE DAILY 90 tablet 1  . LORazepam (ATIVAN) 0.5 MG tablet Take 1 tablet (0.5 mg total) by mouth every 6 (six) hours as needed (Nausea or vomiting). 30 tablet 0  . nystatin (MYCOSTATIN) 100000 UNIT/ML suspension Take 5 mLs (500,000 Units total) by mouth 4 (four) times daily. 60 mL 0  . ondansetron (ZOFRAN) 8 MG tablet Take 1 tablet (8 mg total) by mouth 2 (two) times daily as needed. Start on the third day after chemotherapy. 30 tablet 1  . prochlorperazine (COMPAZINE) 10 MG tablet Take 1 tablet (10 mg total) by mouth every 6 (six) hours as needed (Nausea or vomiting). 30 tablet 1   No current facility-administered medications for this visit.   Facility-Administered Medications  Ordered in Other Visits  Medication Dose Route Frequency Provider Last Rate Last Dose  . 0.9 %  sodium chloride infusion   Intravenous Once Nicholas Lose, MD      . fosaprepitant (EMEND) 150 mg, dexamethasone (DECADRON) 12 mg in sodium chloride  0.9 % 145 mL IVPB   Intravenous Once Nicholas Lose, MD      . heparin lock flush 100 unit/mL  500 Units Intracatheter Once PRN Nicholas Lose, MD      . sodium chloride 0.9 % injection 10 mL  10 mL Intracatheter PRN Nicholas Lose, MD        PHYSICAL EXAMINATION: ECOG PERFORMANCE STATUS: 1 - Symptomatic but completely ambulatory  Filed Vitals:   04/28/15 1133  BP: 122/62  Pulse: 104  Temp: 98.4 F (36.9 C)  Resp: 19   Filed Weights   04/28/15 1133  Weight: 232 lb (105.235 kg)    GENERAL:alert, no distress and comfortable SKIN: skin color, texture, turgor are normal, no rashes or significant lesions EYES: normal, Conjunctiva are pink and non-injected, sclera clear OROPHARYNX:no exudate, no erythema and lips, buccal mucosa, and tongue normal  NECK: supple, thyroid normal size, non-tender, without nodularity LYMPH:  no palpable lymphadenopathy in the cervical, axillary or inguinal LUNGS: clear to auscultation and percussion with normal breathing effort HEART: regular rate & rhythm and no murmurs and no lower extremity edema ABDOMEN:abdomen soft, non-tender and normal bowel sounds Musculoskeletal:no cyanosis of digits and no clubbing  NEURO: alert & oriented x 3 with fluent speech, no focal motor/sensory deficits  LABORATORY DATA:  I have reviewed the data as listed   Chemistry      Component Value Date/Time   NA 138 04/28/2015 1109   NA 133* 04/06/2015 0820   K 4.4 04/28/2015 1109   K 5.3* 04/06/2015 0820   CL 101 04/06/2015 0820   CO2 23 04/28/2015 1109   CO2 26 04/06/2015 0820   BUN 23.5 04/28/2015 1109   BUN 38* 04/06/2015 0820   CREATININE 1.1 04/28/2015 1109   CREATININE 1.30* 04/06/2015 0820      Component Value Date/Time   CALCIUM 9.5 04/28/2015 1109   CALCIUM 9.0 04/06/2015 0820   ALKPHOS 124 04/28/2015 1109   ALKPHOS 94 04/06/2015 0820   AST 11 04/28/2015 1109   AST 17 04/06/2015 0820   ALT 13 04/28/2015 1109   ALT 24 04/06/2015 0820   BILITOT 0.30  04/28/2015 1109   BILITOT 1.0 04/06/2015 0820       Lab Results  Component Value Date   WBC 14.9* 04/28/2015   HGB 8.3* 04/28/2015   HCT 25.8* 04/28/2015   MCV 86.5 04/28/2015   PLT 332 04/28/2015   NEUTROABS 12.3* 04/28/2015   ASSESSMENT & PLAN:  Breast cancer of upper-outer quadrant of left female breast Left breast invasive mammary cancer 1:00 position: 3.1 x 2.9 x 3.6 cm tumor with multiple enlarged level MCCXIII left axillary lymph nodes largest measuring 4 cm per total congruent nor mass measures 10.2 cm ER 12%, PR 0%, Ki-67 100%, HER-2 negative ratio 1.53 CT chest abdomen pelvis and bone scan 03/17/2015: No evidence of metastatic disease Treatment plan: neoadjuvant chemotherapy with dose dense Adriamycin and Cytoxan followed by Abraxane weekly 12. Current treatment: Today cycle 3 day 1 of Adriamycin and Cytoxan  Chemotherapy toxicities: 1. Severe neutropenia nadir counts: Decrease the dosage of chemotherapy for cycle 2 Adriamycin and Cytoxan 2. Thrush prescribed nystatin 3. Fatigue 4. Intermittent nausea  Return to clinic in 2 week for cycle  4 of AC  No orders of the defined types were placed in this encounter.   The patient has a good understanding of the overall plan. she agrees with it. she will call with any problems that may develop before the next visit here.   Rulon Eisenmenger, MD

## 2015-04-28 NOTE — Assessment & Plan Note (Signed)
Left breast invasive mammary cancer 1:00 position: 3.1 x 2.9 x 3.6 cm tumor with multiple enlarged level MCCXIII left axillary lymph nodes largest measuring 4 cm per total congruent nor mass measures 10.2 cm ER 12%, PR 0%, Ki-67 100%, HER-2 negative ratio 1.53 CT chest abdomen pelvis and bone scan 03/17/2015: No evidence of metastatic disease Treatment plan: neoadjuvant chemotherapy with dose dense Adriamycin and Cytoxan followed by Abraxane weekly 12. Current treatment: Today cycle 3 day 1 of Adriamycin and Cytoxan  Chemotherapy toxicities: 1. Severe neutropenia nadir counts: Decrease the dosage of chemotherapy for cycle 2 Adriamycin and Cytoxan 2. Thrush prescribed nystatin  Return to clinic in 2 week for cycle 4 of AC

## 2015-04-28 NOTE — Progress Notes (Signed)
Adriamycin administered through a free dripping normal saline line, positive blood return noted before, every 5 mL during and after Adriamycin infusion. Patient tolerated well.

## 2015-05-11 ENCOUNTER — Other Ambulatory Visit: Payer: Self-pay | Admitting: *Deleted

## 2015-05-11 ENCOUNTER — Ambulatory Visit (HOSPITAL_BASED_OUTPATIENT_CLINIC_OR_DEPARTMENT_OTHER): Payer: Medicare Other

## 2015-05-11 ENCOUNTER — Ambulatory Visit (HOSPITAL_COMMUNITY)
Admission: RE | Admit: 2015-05-11 | Discharge: 2015-05-11 | Disposition: A | Discharge status: Home / Self Care | Payer: Medicare Other | Source: Ambulatory Visit | Attending: Hematology and Oncology | Admitting: Hematology and Oncology

## 2015-05-11 ENCOUNTER — Other Ambulatory Visit (HOSPITAL_BASED_OUTPATIENT_CLINIC_OR_DEPARTMENT_OTHER): Payer: Medicare Other

## 2015-05-11 ENCOUNTER — Telehealth: Payer: Self-pay | Admitting: *Deleted

## 2015-05-11 ENCOUNTER — Ambulatory Visit: Payer: Medicare Other

## 2015-05-11 ENCOUNTER — Ambulatory Visit (HOSPITAL_BASED_OUTPATIENT_CLINIC_OR_DEPARTMENT_OTHER): Payer: Medicare Other | Admitting: Hematology and Oncology

## 2015-05-11 ENCOUNTER — Telehealth: Payer: Self-pay | Admitting: Hematology and Oncology

## 2015-05-11 VITALS — BP 97/55 | HR 95 | Temp 99.3°F | Resp 18 | Ht 65.0 in | Wt 229.3 lb

## 2015-05-11 VITALS — BP 111/57 | HR 83 | Temp 98.9°F | Resp 18

## 2015-05-11 DIAGNOSIS — C50412 Malignant neoplasm of upper-outer quadrant of left female breast: Secondary | ICD-10-CM

## 2015-05-11 DIAGNOSIS — C773 Secondary and unspecified malignant neoplasm of axilla and upper limb lymph nodes: Secondary | ICD-10-CM

## 2015-05-11 DIAGNOSIS — G4762 Sleep related leg cramps: Secondary | ICD-10-CM

## 2015-05-11 DIAGNOSIS — Z5111 Encounter for antineoplastic chemotherapy: Secondary | ICD-10-CM

## 2015-05-11 DIAGNOSIS — T451X5A Adverse effect of antineoplastic and immunosuppressive drugs, initial encounter: Secondary | ICD-10-CM

## 2015-05-11 DIAGNOSIS — D709 Neutropenia, unspecified: Secondary | ICD-10-CM | POA: Diagnosis not present

## 2015-05-11 DIAGNOSIS — D649 Anemia, unspecified: Secondary | ICD-10-CM

## 2015-05-11 DIAGNOSIS — D6481 Anemia due to antineoplastic chemotherapy: Secondary | ICD-10-CM

## 2015-05-11 DIAGNOSIS — B379 Candidiasis, unspecified: Secondary | ICD-10-CM

## 2015-05-11 DIAGNOSIS — I959 Hypotension, unspecified: Secondary | ICD-10-CM

## 2015-05-11 LAB — CBC WITH DIFFERENTIAL/PLATELET
BASO%: 0.3 % (ref 0.0–2.0)
Basophils Absolute: 0 10*3/uL (ref 0.0–0.1)
EOS%: 0.1 % (ref 0.0–7.0)
Eosinophils Absolute: 0 10*3/uL (ref 0.0–0.5)
HCT: 23.7 % — ABNORMAL LOW (ref 34.8–46.6)
HGB: 7.6 g/dL — ABNORMAL LOW (ref 11.6–15.9)
LYMPH%: 5.9 % — ABNORMAL LOW (ref 14.0–49.7)
MCH: 28.6 pg (ref 25.1–34.0)
MCHC: 32.1 g/dL (ref 31.5–36.0)
MCV: 89.1 fL (ref 79.5–101.0)
MONO#: 1.6 10*3/uL — AB (ref 0.1–0.9)
MONO%: 9.9 % (ref 0.0–14.0)
NEUT%: 83.8 % — ABNORMAL HIGH (ref 38.4–76.8)
NEUTROS ABS: 13.3 10*3/uL — AB (ref 1.5–6.5)
PLATELETS: 262 10*3/uL (ref 145–400)
RBC: 2.66 10*6/uL — AB (ref 3.70–5.45)
RDW: 16.9 % — ABNORMAL HIGH (ref 11.2–14.5)
WBC: 15.9 10*3/uL — ABNORMAL HIGH (ref 3.9–10.3)
lymph#: 0.9 10*3/uL (ref 0.9–3.3)

## 2015-05-11 LAB — COMPREHENSIVE METABOLIC PANEL (CC13)
ALT: 13 U/L (ref 0–55)
AST: 13 U/L (ref 5–34)
Albumin: 2.7 g/dL — ABNORMAL LOW (ref 3.5–5.0)
Alkaline Phosphatase: 96 U/L (ref 40–150)
Anion Gap: 13 mEq/L — ABNORMAL HIGH (ref 3–11)
BILIRUBIN TOTAL: 0.39 mg/dL (ref 0.20–1.20)
BUN: 25.7 mg/dL (ref 7.0–26.0)
CO2: 24 mEq/L (ref 22–29)
Calcium: 9.3 mg/dL (ref 8.4–10.4)
Chloride: 98 mEq/L (ref 98–109)
Creatinine: 1.5 mg/dL — ABNORMAL HIGH (ref 0.6–1.1)
EGFR: 35 mL/min/{1.73_m2} — AB (ref >90)
Glucose: 150 mg/dl — ABNORMAL HIGH (ref 70–140)
Potassium: 4.9 mEq/L (ref 3.5–5.1)
SODIUM: 135 meq/L — AB (ref 136–145)
TOTAL PROTEIN: 6.6 g/dL (ref 6.4–8.3)

## 2015-05-11 LAB — ABO/RH: ABO/RH(D): A POS

## 2015-05-11 LAB — PREPARE RBC (CROSSMATCH)

## 2015-05-11 MED ORDER — CYCLOPHOSPHAMIDE CHEMO INJECTION 1 GM
500.0000 mg/m2 | Freq: Once | INTRAMUSCULAR | Status: AC
  Administered 2015-05-11: 1120 mg via INTRAVENOUS
  Filled 2015-05-11: qty 56

## 2015-05-11 MED ORDER — DOXORUBICIN HCL CHEMO IV INJECTION 2 MG/ML
50.0000 mg/m2 | Freq: Once | INTRAVENOUS | Status: AC
  Administered 2015-05-11: 112 mg via INTRAVENOUS
  Filled 2015-05-11: qty 56

## 2015-05-11 MED ORDER — SODIUM CHLORIDE 0.9 % IV SOLN
250.0000 mL | Freq: Once | INTRAVENOUS | Status: AC
  Administered 2015-05-11: 250 mL via INTRAVENOUS

## 2015-05-11 MED ORDER — PALONOSETRON HCL INJECTION 0.25 MG/5ML
INTRAVENOUS | Status: AC
  Filled 2015-05-11: qty 5

## 2015-05-11 MED ORDER — ACETAMINOPHEN 325 MG PO TABS
650.0000 mg | ORAL_TABLET | Freq: Once | ORAL | Status: AC
  Administered 2015-05-11: 650 mg via ORAL

## 2015-05-11 MED ORDER — FOSAPREPITANT DIMEGLUMINE INJECTION 150 MG
Freq: Once | INTRAVENOUS | Status: AC
  Administered 2015-05-11: 10:00:00 via INTRAVENOUS
  Filled 2015-05-11: qty 5

## 2015-05-11 MED ORDER — HEPARIN SOD (PORK) LOCK FLUSH 100 UNIT/ML IV SOLN
500.0000 [IU] | Freq: Once | INTRAVENOUS | Status: AC | PRN
  Administered 2015-05-11: 500 [IU]
  Filled 2015-05-11: qty 5

## 2015-05-11 MED ORDER — ACETAMINOPHEN 325 MG PO TABS
ORAL_TABLET | ORAL | Status: AC
  Filled 2015-05-11: qty 2

## 2015-05-11 MED ORDER — SODIUM CHLORIDE 0.9 % IV SOLN
Freq: Once | INTRAVENOUS | Status: AC
  Administered 2015-05-11: 10:00:00 via INTRAVENOUS

## 2015-05-11 MED ORDER — SODIUM CHLORIDE 0.9 % IJ SOLN
10.0000 mL | INTRAMUSCULAR | Status: DC | PRN
  Administered 2015-05-11: 10 mL
  Filled 2015-05-11: qty 10

## 2015-05-11 MED ORDER — DIPHENHYDRAMINE HCL 25 MG PO CAPS
25.0000 mg | ORAL_CAPSULE | Freq: Once | ORAL | Status: AC
  Administered 2015-05-11: 25 mg via ORAL

## 2015-05-11 MED ORDER — PALONOSETRON HCL INJECTION 0.25 MG/5ML
0.2500 mg | Freq: Once | INTRAVENOUS | Status: AC
  Administered 2015-05-11: 0.25 mg via INTRAVENOUS

## 2015-05-11 MED ORDER — PEGFILGRASTIM 6 MG/0.6ML ~~LOC~~ PSKT
6.0000 mg | PREFILLED_SYRINGE | Freq: Once | SUBCUTANEOUS | Status: AC
  Administered 2015-05-11: 6 mg via SUBCUTANEOUS
  Filled 2015-05-11: qty 0.6

## 2015-05-11 MED ORDER — DIPHENHYDRAMINE HCL 25 MG PO CAPS
ORAL_CAPSULE | ORAL | Status: AC
  Filled 2015-05-11: qty 1

## 2015-05-11 NOTE — Progress Notes (Signed)
Per Dr. Lindi Adie, okay to treat today despite labs. Pt will receive additional IVF and 1 unit PRBCs today as well as chemo.

## 2015-05-11 NOTE — Assessment & Plan Note (Signed)
Left breast invasive mammary cancer 1:00 position: 3.1 x 2.9 x 3.6 cm tumor with multiple enlarged level MCCXIII left axillary lymph nodes largest measuring 4 cm per total congruent nor mass measures 10.2 cm ER 12%, PR 0%, Ki-67 100%, HER-2 negative ratio 1.53 CT chest abdomen pelvis and bone scan 03/17/2015: No evidence of metastatic disease Treatment plan: neoadjuvant chemotherapy with dose dense Adriamycin and Cytoxan followed by Abraxane weekly 12. Current treatment: Today cycle 4 day 1 of Adriamycin and Cytoxan  Chemotherapy toxicities: 1. Severe neutropenia nadir counts: Decrease the dosage of chemotherapy for cycle 2 Adriamycin and Cytoxan 2. Thrush prescribed nystatin 3. Fatigue 4. Intermittent nausea  Return to clinic in 2 week for week 1/12 Abraxane

## 2015-05-11 NOTE — Telephone Encounter (Signed)
Per staff message and POF I have scheduled appts. Advised scheduler of appts. JMW  

## 2015-05-11 NOTE — Progress Notes (Signed)
Patient Care Team: Midge Minium, MD as PCP - General (Family Medicine) Mackie Pai, PA-C as Physician Assistant (Physician Assistant)  DIAGNOSIS: No matching staging information was found for the patient.  SUMMARY OF ONCOLOGIC HISTORY:   Breast cancer of upper-outer quadrant of left female breast   03/02/2015 Initial Diagnosis Left breast biopsy 1:00: Invasive mammary cancer, grade 3, 1 lymph node biopsy in the axilla positive, ER 12%, PR 0%, Ki-67 100%, HER-2 negative ratio 1.53   03/08/2015 Breast MRI Left breast 1:00 position: 3.6 cm mass with enlarged axillary lymph nodes multiple levels largest 4 cm, total mass measures 10.2 cm   03/30/2015 -  Neo-Adjuvant Chemotherapy Adriamycin Cytoxan 4 followed by Abraxane weekly 12    CHIEF COMPLIANT: Worse fatigue, leg cramps, cycle 4 Adriamycin and Cytoxan  INTERVAL HISTORY: Cassandra Maldonado is a 69 year old with above-mentioned history of left breast cancer currently on neoadjuvant chemotherapy. Today cycle 4 of dose dense Adriamycin and Cytoxan. Since last visit she is more fatigued than usual. She also noticed several nights of leg cramps. She has not been eating or drinking as much as she used to. Denies any nausea or vomiting.  REVIEW OF SYSTEMS:   Constitutional: Denies fevers, chills or abnormal weight loss Eyes: Denies blurriness of vision Ears, nose, mouth, throat, and face: Denies mucositis or sore throat Respiratory: Denies cough, dyspnea or wheezes Cardiovascular: Denies palpitation, chest discomfort or lower extremity swelling Gastrointestinal:  Denies nausea, heartburn or change in bowel habits Skin: Denies abnormal skin rashes Lymphatics: Denies new lymphadenopathy or easy bruising Neurological:Denies numbness, tingling or new weaknesses Behavioral/Psych: Mood is stable, no new changes  Breast:  denies any pain or lumps or nodules in either breasts All other systems were reviewed with the patient and are negative.  I  have reviewed the past medical history, past surgical history, social history and family history with the patient and they are unchanged from previous note.  ALLERGIES:  has No Known Allergies.  MEDICATIONS:  Current Outpatient Prescriptions  Medication Sig Dispense Refill  . albuterol (PROVENTIL HFA;VENTOLIN HFA) 108 (90 BASE) MCG/ACT inhaler Inhale 2 puffs into the lungs every 6 (six) hours as needed for wheezing or shortness of breath. 1 Inhaler 0  . allopurinol (ZYLOPRIM) 100 MG tablet TAKE ONE TABLET BY MOUTH TWICE DAILY 60 tablet 1  . benzonatate (TESSALON) 200 MG capsule Take 1 capsule (200 mg total) by mouth 3 (three) times daily as needed for cough. 30 capsule 0  . dexamethasone (DECADRON) 4 MG tablet Take 2 tablets by mouth once a day on the day after chemotherapy and then take 2 tablets two times a day for 2 days. Take with food. 30 tablet 1  . lidocaine-prilocaine (EMLA) cream Apply to affected area once 30 g 3  . lisinopril-hydrochlorothiazide (PRINZIDE,ZESTORETIC) 10-12.5 MG per tablet TAKE ONE TABLET BY MOUTH ONCE DAILY 90 tablet 1  . LORazepam (ATIVAN) 0.5 MG tablet Take 1 tablet (0.5 mg total) by mouth every 6 (six) hours as needed (Nausea or vomiting). 30 tablet 0  . nystatin (MYCOSTATIN) 100000 UNIT/ML suspension Take 5 mLs (500,000 Units total) by mouth 4 (four) times daily. 60 mL 0  . ondansetron (ZOFRAN) 8 MG tablet Take 1 tablet (8 mg total) by mouth 2 (two) times daily as needed. Start on the third day after chemotherapy. 30 tablet 1  . prochlorperazine (COMPAZINE) 10 MG tablet Take 1 tablet (10 mg total) by mouth every 6 (six) hours as needed (Nausea or vomiting). 30 tablet 1  No current facility-administered medications for this visit.    PHYSICAL EXAMINATION: ECOG PERFORMANCE STATUS: 1 - Symptomatic but completely ambulatory  Filed Vitals:   05/11/15 0823  BP: 97/55  Pulse:   Temp:   Resp:    Filed Weights   05/11/15 0822  Weight: 229 lb 4.8 oz (104.01 kg)     GENERAL:alert, no distress and comfortable SKIN: skin color, texture, turgor are normal, no rashes or significant lesions EYES: normal, Conjunctiva are pink and non-injected, sclera clear OROPHARYNX:no exudate, no erythema and lips, buccal mucosa, and tongue normal  NECK: supple, thyroid normal size, non-tender, without nodularity LYMPH:  no palpable lymphadenopathy in the cervical, axillary or inguinal LUNGS: clear to auscultation and percussion with normal breathing effort HEART: regular rate & rhythm and no murmurs and no lower extremity edema ABDOMEN:abdomen soft, non-tender and normal bowel sounds Musculoskeletal:no cyanosis of digits and no clubbing  NEURO: alert & oriented x 3 with fluent speech, no focal motor/sensory deficits   LABORATORY DATA:  I have reviewed the data as listed   Chemistry      Component Value Date/Time   NA 135* 05/11/2015 0808   NA 133* 04/06/2015 0820   K 4.9 05/11/2015 0808   K 5.3* 04/06/2015 0820   CL 101 04/06/2015 0820   CO2 24 05/11/2015 0808   CO2 26 04/06/2015 0820   BUN 25.7 05/11/2015 0808   BUN 38* 04/06/2015 0820   CREATININE 1.5* 05/11/2015 0808   CREATININE 1.30* 04/06/2015 0820      Component Value Date/Time   CALCIUM 9.3 05/11/2015 0808   CALCIUM 9.0 04/06/2015 0820   ALKPHOS 96 05/11/2015 0808   ALKPHOS 94 04/06/2015 0820   AST 13 05/11/2015 0808   AST 17 04/06/2015 0820   ALT 13 05/11/2015 0808   ALT 24 04/06/2015 0820   BILITOT 0.39 05/11/2015 0808   BILITOT 1.0 04/06/2015 0820       Lab Results  Component Value Date   WBC 15.9* 05/11/2015   HGB 7.6* 05/11/2015   HCT 23.7* 05/11/2015   MCV 89.1 05/11/2015   PLT 262 05/11/2015   NEUTROABS 13.3* 05/11/2015    ASSESSMENT & PLAN:  Breast cancer of upper-outer quadrant of left female breast Left breast invasive mammary cancer 1:00 position: 3.1 x 2.9 x 3.6 cm tumor with multiple enlarged level MCCXIII left axillary lymph nodes largest measuring 4 cm per total  congruent nor mass measures 10.2 cm ER 12%, PR 0%, Ki-67 100%, HER-2 negative ratio 1.53 CT chest abdomen pelvis and bone scan 03/17/2015: No evidence of metastatic disease Treatment plan: neoadjuvant chemotherapy with dose dense Adriamycin and Cytoxan followed by Abraxane weekly 12. Current treatment: Today cycle 4 day 1 of Adriamycin and Cytoxan  Chemotherapy toxicities: 1. Severe neutropenia nadir counts: Decrease the dosage of chemotherapy for cycle 2 Adriamycin and Cytoxan 2. Thrush prescribed nystatin 3. Fatigue: Combination of chemotherapy, anemia 4. Intermittent nausea 5. Severe anemia hemoglobin progressively coming down: Today's hemoglobin 7.6 we will give her 1 unit of packed red cells. 6. Dehydration: Encourage her to increase her fluid intake. We will give her 500 cc normal saline today. 6. Leg cramps: Most likely related to dehydration. Encouraged her to drink more fluids. 7. Hypotension: I asked her to discontinue lisinopril with hydrochlorothiazide. Once her blood pressure comes up we can resume her antihypertensive medication.  Return to clinic in 2 week for week 1/12 Abraxane  No orders of the defined types were placed in this encounter.   The  patient has a good understanding of the overall plan. she agrees with it. she will call with any problems that may develop before the next visit here.   Rulon Eisenmenger, MD

## 2015-05-11 NOTE — Telephone Encounter (Signed)
Gave avs & calendar for June/July. Sent message to schedule treatment

## 2015-05-11 NOTE — Patient Instructions (Signed)
[facility] Discharge Instructions for Patients Receiving Chemotherapy  Today you received the following chemotherapy agents: Adriamycin and Cytoxan.   To help prevent nausea and vomiting after your treatment, we encourage you to take your nausea medication as directed.    If you develop nausea and vomiting that is not controlled by your nausea medication, call the clinic.   BELOW ARE SYMPTOMS THAT SHOULD BE REPORTED IMMEDIATELY:  *FEVER GREATER THAN 100.5 F  *CHILLS WITH OR WITHOUT FEVER  NAUSEA AND VOMITING THAT IS NOT CONTROLLED WITH YOUR NAUSEA MEDICATION  *UNUSUAL SHORTNESS OF BREATH  *UNUSUAL BRUISING OR BLEEDING  TENDERNESS IN MOUTH AND THROAT WITH OR WITHOUT PRESENCE OF ULCERS  *URINARY PROBLEMS  *BOWEL PROBLEMS  UNUSUAL RASH Items with * indicate a potential emergency and should be followed up as soon as possible.  Feel free to call the clinic you have any questions or concerns. The clinic phone number is [phone].  Please show the [facility] at check-in to the Emergency Department and triage nurse.  Blood Transfusion Information WHAT IS A BLOOD TRANSFUSION? A transfusion is the replacement of blood or some of its parts. Blood is made up of multiple cells which provide different functions.  Red blood cells carry oxygen and are used for blood loss replacement.  White blood cells fight against infection.  Platelets control bleeding.  Plasma helps clot blood.  Other blood products are available for specialized needs, such as hemophilia or other clotting disorders. BEFORE THE TRANSFUSION  Who gives blood for transfusions?   You may be able to donate blood to be used at a later date on yourself (autologous donation).  Relatives can be asked to donate blood. This is generally not any safer than if you have received blood from a stranger. The same precautions are taken to ensure safety when a relative's blood is donated.  Healthy  volunteers who are fully evaluated to make sure their blood is safe. This is blood bank blood. Transfusion therapy is the safest it has ever been in the practice of medicine. Before blood is taken from a donor, a complete history is taken to make sure that person has no history of diseases nor engages in risky social behavior (examples are intravenous drug use or sexual activity with multiple partners). The donor's travel history is screened to minimize risk of transmitting infections, such as malaria. The donated blood is tested for signs of infectious diseases, such as HIV and hepatitis. The blood is then tested to be sure it is compatible with you in order to minimize the chance of a transfusion reaction. If you or a relative donates blood, this is often done in anticipation of surgery and is not appropriate for emergency situations. It takes many days to process the donated blood. RISKS AND COMPLICATIONS Although transfusion therapy is very safe and saves many lives, the main dangers of transfusion include:   Getting an infectious disease.  Developing a transfusion reaction. This is an allergic reaction to something in the blood you were given. Every precaution is taken to prevent this. The decision to have a blood transfusion has been considered carefully by your caregiver before blood is given. Blood is not given unless the benefits outweigh the risks. AFTER THE TRANSFUSION  Right after receiving a blood transfusion, you will usually feel much better and more energetic. This is especially true if your red blood cells have gotten low (anemic). The transfusion raises the level of the red blood cells which  carry oxygen, and this usually causes an energy increase.  The nurse administering the transfusion will monitor you carefully for complications. HOME CARE INSTRUCTIONS  No special instructions are needed after a transfusion. You may find your energy is better. Speak with your caregiver about any  limitations on activity for underlying diseases you may have. SEEK MEDICAL CARE IF:   Your condition is not improving after your transfusion.  You develop redness or irritation at the intravenous (IV) site. SEEK IMMEDIATE MEDICAL CARE IF:  Any of the following symptoms occur over the next 12 hours:  Shaking chills.  You have a temperature by mouth above 102 F (38.9 C), not controlled by medicine.  Chest, back, or muscle pain.  People around you feel you are not acting correctly or are confused.  Shortness of breath or difficulty breathing.  Dizziness and fainting.  You get a rash or develop hives.  You have a decrease in urine output.  Your urine turns a dark color or changes to pink, red, or brown. Any of the following symptoms occur over the next 10 days:  You have a temperature by mouth above 102 F (38.9 C), not controlled by medicine.  Shortness of breath.  Weakness after normal activity.  The white part of the eye turns yellow (jaundice).  You have a decrease in the amount of urine or are urinating less often.  Your urine turns a dark color or changes to pink, red, or brown. Document Released: 12/06/2000 Document Revised: 03/02/2012 Document Reviewed: 07/25/2008 [facility] Patient Information 2015 [place], [place]. This information is not intended to replace advice given to you by your health care provider. Make sure you discuss any questions you have with your health care provider.

## 2015-05-12 LAB — TYPE AND SCREEN
ABO/RH(D): A POS
Antibody Screen: NEGATIVE
Unit division: 0

## 2015-05-16 ENCOUNTER — Encounter: Payer: Self-pay | Admitting: Hematology and Oncology

## 2015-05-16 NOTE — Progress Notes (Signed)
I refaxed zofran request to [facility]

## 2015-05-17 ENCOUNTER — Encounter: Payer: Self-pay | Admitting: *Deleted

## 2015-05-18 ENCOUNTER — Other Ambulatory Visit: Payer: Self-pay | Admitting: *Deleted

## 2015-05-18 ENCOUNTER — Telehealth: Payer: Self-pay | Admitting: *Deleted

## 2015-05-18 DIAGNOSIS — C50412 Malignant neoplasm of upper-outer quadrant of left female breast: Secondary | ICD-10-CM

## 2015-05-18 MED ORDER — AMOXICILLIN 500 MG PO TABS: 500.0000 mg | ORAL_TABLET | Freq: Two times a day (BID) | ORAL | Status: DC

## 2015-05-18 NOTE — Telephone Encounter (Signed)
Patient called stating right side of throat has been sore/painful for 2-3 days. Swallowing has become difficult for patient without relief. RN instructed patient to gargle with salt and water as needed. Patient would like MD to be aware and if she should report this to PCP. Please advise patient. Message sent to MD Lindi Adie and RN Beverlee Nims.

## 2015-05-18 NOTE — Telephone Encounter (Signed)
Amoxicillin 500 mg BID x 10 days sent to pharmacy. Patient notified. Patient also advised to call us back if symptoms do not improve in a couple of days. She verbalized understanding.

## 2015-05-19 NOTE — Progress Notes (Signed)
TC from [facility] rejecting authorization for Ondansetron d/t diagnosis.  Phone # [phone] Please follow up

## 2015-05-25 ENCOUNTER — Ambulatory Visit: Payer: Medicare Other

## 2015-05-25 ENCOUNTER — Other Ambulatory Visit (HOSPITAL_BASED_OUTPATIENT_CLINIC_OR_DEPARTMENT_OTHER): Payer: Medicare Other

## 2015-05-25 ENCOUNTER — Other Ambulatory Visit: Payer: Self-pay | Admitting: *Deleted

## 2015-05-25 ENCOUNTER — Telehealth: Payer: Self-pay | Admitting: Hematology and Oncology

## 2015-05-25 ENCOUNTER — Ambulatory Visit (HOSPITAL_COMMUNITY)
Admission: RE | Admit: 2015-05-25 | Discharge: 2015-05-25 | Disposition: A | Discharge status: Home / Self Care | Payer: Medicare Other | Source: Ambulatory Visit | Attending: Hematology and Oncology | Admitting: Hematology and Oncology

## 2015-05-25 ENCOUNTER — Encounter: Payer: Self-pay | Admitting: Nurse Practitioner

## 2015-05-25 ENCOUNTER — Other Ambulatory Visit: Payer: Self-pay | Admitting: Hematology and Oncology

## 2015-05-25 ENCOUNTER — Ambulatory Visit (HOSPITAL_BASED_OUTPATIENT_CLINIC_OR_DEPARTMENT_OTHER): Payer: Medicare Other | Admitting: Nurse Practitioner

## 2015-05-25 VITALS — Temp 102.6°F

## 2015-05-25 VITALS — BP 126/60 | HR 107 | Temp 99.5°F | Resp 18 | Ht 65.0 in | Wt 226.1 lb

## 2015-05-25 DIAGNOSIS — C773 Secondary and unspecified malignant neoplasm of axilla and upper limb lymph nodes: Secondary | ICD-10-CM

## 2015-05-25 DIAGNOSIS — T451X5A Adverse effect of antineoplastic and immunosuppressive drugs, initial encounter: Secondary | ICD-10-CM | POA: Diagnosis not present

## 2015-05-25 DIAGNOSIS — R509 Fever, unspecified: Secondary | ICD-10-CM

## 2015-05-25 DIAGNOSIS — C50412 Malignant neoplasm of upper-outer quadrant of left female breast: Secondary | ICD-10-CM

## 2015-05-25 DIAGNOSIS — D630 Anemia in neoplastic disease: Secondary | ICD-10-CM

## 2015-05-25 DIAGNOSIS — C50912 Malignant neoplasm of unspecified site of left female breast: Secondary | ICD-10-CM

## 2015-05-25 DIAGNOSIS — D6481 Anemia due to antineoplastic chemotherapy: Secondary | ICD-10-CM

## 2015-05-25 DIAGNOSIS — R110 Nausea: Secondary | ICD-10-CM

## 2015-05-25 LAB — COMPREHENSIVE METABOLIC PANEL (CC13)
ALBUMIN: 2.5 g/dL — AB (ref 3.5–5.0)
ALK PHOS: 86 U/L (ref 40–150)
ALT: 11 U/L (ref 0–55)
ANION GAP: 9 meq/L (ref 3–11)
AST: 18 U/L (ref 5–34)
BUN: 20 mg/dL (ref 7.0–26.0)
CALCIUM: 9.1 mg/dL (ref 8.4–10.4)
CO2: 26 mEq/L (ref 22–29)
Chloride: 99 mEq/L (ref 98–109)
Creatinine: 1.2 mg/dL — ABNORMAL HIGH (ref 0.6–1.1)
EGFR: 49 mL/min/{1.73_m2} — AB (ref >90)
GLUCOSE: 121 mg/dL (ref 70–140)
POTASSIUM: 3.8 meq/L (ref 3.5–5.1)
Sodium: 134 mEq/L — ABNORMAL LOW (ref 136–145)
TOTAL PROTEIN: 6.6 g/dL (ref 6.4–8.3)
Total Bilirubin: 0.41 mg/dL (ref 0.20–1.20)

## 2015-05-25 LAB — CBC WITH DIFFERENTIAL/PLATELET
BASO%: 0.6 % (ref 0.0–2.0)
Basophils Absolute: 0 10*3/uL (ref 0.0–0.1)
EOS ABS: 0 10*3/uL (ref 0.0–0.5)
EOS%: 0.1 % (ref 0.0–7.0)
HEMATOCRIT: 21 % — AB (ref 34.8–46.6)
HGB: 7.1 g/dL — ABNORMAL LOW (ref 11.6–15.9)
LYMPH%: 8.1 % — ABNORMAL LOW (ref 14.0–49.7)
MCH: 29.5 pg (ref 25.1–34.0)
MCHC: 33.8 g/dL (ref 31.5–36.0)
MCV: 87.3 fL (ref 79.5–101.0)
MONO#: 0.8 10*3/uL (ref 0.1–0.9)
MONO%: 15.5 % — ABNORMAL HIGH (ref 0.0–14.0)
NEUT%: 75.7 % (ref 38.4–76.8)
NEUTROS ABS: 4.1 10*3/uL (ref 1.5–6.5)
PLATELETS: 313 10*3/uL (ref 145–400)
RBC: 2.4 10*6/uL — ABNORMAL LOW (ref 3.70–5.45)
RDW: 16.9 % — AB (ref 11.2–14.5)
WBC: 5.4 10*3/uL (ref 3.9–10.3)
lymph#: 0.4 10*3/uL — ABNORMAL LOW (ref 0.9–3.3)

## 2015-05-25 LAB — HOLD TUBE, BLOOD BANK

## 2015-05-25 MED ORDER — ACETAMINOPHEN 325 MG PO TABS
ORAL_TABLET | ORAL | Status: AC
  Filled 2015-05-25: qty 2

## 2015-05-25 MED ORDER — SODIUM CHLORIDE 0.9 % IJ SOLN
10.0000 mL | INTRAMUSCULAR | Status: DC | PRN
  Administered 2015-05-25: 10 mL via INTRAVENOUS
  Filled 2015-05-25: qty 10

## 2015-05-25 MED ORDER — HEPARIN SOD (PORK) LOCK FLUSH 100 UNIT/ML IV SOLN
500.0000 [IU] | Freq: Once | INTRAVENOUS | Status: AC
  Administered 2015-05-25: 500 [IU] via INTRAVENOUS
  Filled 2015-05-25: qty 5

## 2015-05-25 MED ORDER — ACETAMINOPHEN 325 MG PO TABS
650.0000 mg | ORAL_TABLET | Freq: Four times a day (QID) | ORAL | Status: DC | PRN
  Administered 2015-05-25: 650 mg via ORAL

## 2015-05-25 MED ORDER — LEVOFLOXACIN 500 MG PO TABS: 500.0000 mg | ORAL_TABLET | Freq: Every day | ORAL | Status: DC

## 2015-05-25 NOTE — Progress Notes (Signed)
1440: Per Dr. Lindi Adie, okay to treat pt with hgb 7.1.  Type and cross being drawn this appointment and patient will return later in the week for blood tranfusion.  1545: Temp 101.6 orally, rechecked and found to be 102.6; Gentry Fitz, NP notified who spoke to Dr. Lindi Adie. Decision made to hold treatment for today, give 650mg  of tylenol, draw type and cross and send patient home.  Patient will return 05/26/2015 for transfusion of 1 unit of blood.  1557:Pt discharged ambulatory with husband and no current complaints.

## 2015-05-25 NOTE — Progress Notes (Signed)
Patient Care Team: Midge Minium, MD as PCP - General (Family Medicine) Mackie Pai, PA-C as Physician Assistant (Physician Assistant)  DIAGNOSIS: No matching staging information was found for the patient.  SUMMARY OF ONCOLOGIC HISTORY:   Breast cancer of upper-outer quadrant of left female breast   03/02/2015 Initial Diagnosis Left breast biopsy 1:00: Invasive mammary cancer, grade 3, 1 lymph node biopsy in the axilla positive, ER 12%, PR 0%, Ki-67 100%, HER-2 negative ratio 1.53   03/08/2015 Breast MRI Left breast 1:00 position: 3.6 cm mass with enlarged axillary lymph nodes multiple levels largest 4 cm, total mass measures 10.2 cm   03/30/2015 -  Neo-Adjuvant Chemotherapy Adriamycin Cytoxan 4 followed by Abraxane weekly 12    CHIEF COMPLIANT: to start abraxane  INTERVAL HISTORY: Cassandra Maldonado is a 69 year old with above-mentioned history of left breast cancer currently on neoadjuvant chemotherapy. She completed 4 cycles of Adriamycin and Cytoxan. Today she is to begin abraxane weekly x 12. She had a temp of 100.8 upon arrival. She was prescribed amoxicillin last week for a severe sore throat, and this has improved tremendously. She has a fever blister to the left corner of her mouth, but this is not particularly troublesome. She applies vaseline to this area as needed. Her appetite comes and goes and she has been fatgiued lately. She was given 1 units of blood 2 weeks ago, but her hgb has declined again.  REVIEW OF SYSTEMS:   Constitutional: denies chills, or unintentional weight loss, fevers today Eyes: Denies blurriness of vision Ears, nose, mouth, throat, and face: Denies mucositis or sore throat Respiratory: Denies cough, dyspnea or wheezes Cardiovascular: Denies palpitation, chest discomfort or lower extremity swelling Gastrointestinal:  Denies nausea, heartburn or change in bowel habits Skin: Denies abnormal skin rashes Lymphatics: Denies new lymphadenopathy or easy  bruising Neurological:Denies numbness, tingling or new weaknesses Behavioral/Psych: Mood is stable, no new changes  Breast:  denies any pain or lumps or nodules in either breasts All other systems were reviewed with the patient and are negative.  I have reviewed the past medical history, past surgical history, social history and family history with the patient and they are unchanged from previous note.  ALLERGIES:  has No Known Allergies.  MEDICATIONS:  Current Outpatient Prescriptions  Medication Sig Dispense Refill  . allopurinol (ZYLOPRIM) 100 MG tablet TAKE ONE TABLET BY MOUTH TWICE DAILY 60 tablet 1  . amoxicillin (AMOXIL) 500 MG tablet Take 1 tablet (500 mg total) by mouth 2 (two) times daily. 20 tablet 0  . benzonatate (TESSALON) 200 MG capsule Take 1 capsule (200 mg total) by mouth 3 (three) times daily as needed for cough. 30 capsule 0  . LORazepam (ATIVAN) 0.5 MG tablet Take 1 tablet (0.5 mg total) by mouth every 6 (six) hours as needed (Nausea or vomiting). 30 tablet 0  . nystatin (MYCOSTATIN) 100000 UNIT/ML suspension Take 5 mLs (500,000 Units total) by mouth 4 (four) times daily. 60 mL 0  . albuterol (PROVENTIL HFA;VENTOLIN HFA) 108 (90 BASE) MCG/ACT inhaler Inhale 2 puffs into the lungs every 6 (six) hours as needed for wheezing or shortness of breath. (Patient not taking: Reported on 05/25/2015) 1 Inhaler 0  . levofloxacin (LEVAQUIN) 500 MG tablet Take 1 tablet (500 mg total) by mouth daily. 7 tablet 0  . lisinopril-hydrochlorothiazide (PRINZIDE,ZESTORETIC) 10-12.5 MG per tablet TAKE ONE TABLET BY MOUTH ONCE DAILY (Patient not taking: Reported on 05/25/2015) 90 tablet 1   No current facility-administered medications for this visit.   Facility-Administered  Medications Ordered in Other Visits  Medication Dose Route Frequency Provider Last Rate Last Dose  . acetaminophen (TYLENOL) tablet 650 mg  650 mg Oral Q6H PRN Laurie Panda, NP   650 mg at 05/25/15 1529  . sodium chloride  0.9 % injection 10 mL  10 mL Intravenous PRN Nicholas Lose, MD   10 mL at 05/25/15 1547    PHYSICAL EXAMINATION: ECOG PERFORMANCE STATUS: 1 - Symptomatic but completely ambulatory  Filed Vitals:   05/25/15 1343  BP: 126/60  Pulse: 107  Temp: 99.5 F (37.5 C)  Resp: 18   Filed Weights   05/25/15 1343  Weight: 226 lb 1.6 oz (102.558 kg)    GENERAL:alert, no distress and comfortable SKIN: skin color, texture, turgor are normal, no rashes or significant lesions EYES: normal, Conjunctiva are pink and non-injected, sclera clear OROPHARYNX:no exudate, no erythema and lips, buccal mucosa, and tongue normal  NECK: supple, thyroid normal size, non-tender, without nodularity LYMPH:  no palpable lymphadenopathy in the cervical, axillary or inguinal LUNGS: clear to auscultation and percussion with normal breathing effort HEART: regular rate & rhythm and no murmurs and no lower extremity edema ABDOMEN:abdomen soft, non-tender and normal bowel sounds Musculoskeletal:no cyanosis of digits and no clubbing  NEURO: alert & oriented x 3 with fluent speech, no focal motor/sensory deficits   LABORATORY DATA:  I have reviewed the data as listed   Chemistry      Component Value Date/Time   NA 134* 05/25/2015 1332   NA 133* 04/06/2015 0820   K 3.8 05/25/2015 1332   K 5.3* 04/06/2015 0820   CL 101 04/06/2015 0820   CO2 26 05/25/2015 1332   CO2 26 04/06/2015 0820   BUN 20.0 05/25/2015 1332   BUN 38* 04/06/2015 0820   CREATININE 1.2* 05/25/2015 1332   CREATININE 1.30* 04/06/2015 0820      Component Value Date/Time   CALCIUM 9.1 05/25/2015 1332   CALCIUM 9.0 04/06/2015 0820   ALKPHOS 86 05/25/2015 1332   ALKPHOS 94 04/06/2015 0820   AST 18 05/25/2015 1332   AST 17 04/06/2015 0820   ALT 11 05/25/2015 1332   ALT 24 04/06/2015 0820   BILITOT 0.41 05/25/2015 1332   BILITOT 1.0 04/06/2015 0820       Lab Results  Component Value Date   WBC 5.4 05/25/2015   HGB 7.1* 05/25/2015   HCT  21.0* 05/25/2015   MCV 87.3 05/25/2015   PLT 313 05/25/2015   NEUTROABS 4.1 05/25/2015    ASSESSMENT & PLAN:  Breast cancer of upper-outer quadrant of left female breast Left breast invasive mammary cancer 1:00 position: 3.1 x 2.9 x 3.6 cm tumor with multiple enlarged level MCCXIII left axillary lymph nodes largest measuring 4 cm per total congruent nor mass measures 10.2 cm ER 12%, PR 0%, Ki-67 100%, HER-2 negative ratio 1.53 CT chest abdomen pelvis and bone scan 03/17/2015: No evidence of metastatic disease Treatment plan: neoadjuvant chemotherapy with dose dense Adriamycin and Cytoxan followed by Abraxane weekly 12. Current treatment: Today cycle 1, day 1 of abraxane.  Chemotherapy toxicities: 1. Severe neutropenia nadir counts: Decrease the dosage of chemotherapy for cycle 2 Adriamycin and Cytoxan 2. Thrush prescribed nystatin 3. Fatigue: Combination of chemotherapy, anemia 4. Intermittent nausea 5. Severe anemia hemoglobin progressively coming down: Today's hemoglobin 7.1, we will give her 1 unit of packed red cells tomorrow 6. Dehydration: Encourage her to increase her fluid intake.  6. Leg cramps: Most likely related to dehydration. Encouraged her to drink  more fluids. 7. Hypotension: I asked her to discontinue lisinopril with hydrochlorothiazide. Once her blood pressure comes up we can resume her antihypertensive medication. 8. Fever: tylenol 694m PO given before discharge. Prescription for levaquin sent to pharmacy.  We briefly reviewed the potential side effects and toxicities of abraxane. She understands that she will be treated weekly. We are discontinuing the use of dexamethasone, and she will rely on compazine and zofran alone. Because of her fever, we will not be treating today. We will start next week.    Orders Placed This Encounter  Procedures  . CBC with Differential    Standing Status: Standing     Number of Occurrences: 12     Standing Expiration Date: 05/24/2016   . Comprehensive metabolic panel    Standing Status: Standing     Number of Occurrences: 12     Standing Expiration Date: 05/24/2016  . Type and screen    Standing Status: Future     Number of Occurrences:      Standing Expiration Date: 05/24/2016   The patient has a good understanding of the overall plan. she agrees with it. she will call with any problems that may develop before the next visit here.   HLaurie Panda NP

## 2015-05-25 NOTE — Patient Instructions (Signed)

## 2015-05-25 NOTE — Telephone Encounter (Signed)
Gave patient avs report and appointments for June thru August. °

## 2015-05-26 ENCOUNTER — Ambulatory Visit (HOSPITAL_COMMUNITY)
Admission: RE | Admit: 2015-05-26 | Discharge: 2015-05-26 | Disposition: A | Discharge status: Home / Self Care | Payer: Medicare Other | Source: Ambulatory Visit | Attending: Hematology and Oncology | Admitting: Hematology and Oncology

## 2015-05-26 VITALS — BP 118/61 | HR 86 | Temp 98.4°F | Resp 18

## 2015-05-26 DIAGNOSIS — T451X5A Adverse effect of antineoplastic and immunosuppressive drugs, initial encounter: Principal | ICD-10-CM

## 2015-05-26 DIAGNOSIS — D6481 Anemia due to antineoplastic chemotherapy: Secondary | ICD-10-CM

## 2015-05-26 LAB — PREPARE RBC (CROSSMATCH)

## 2015-05-26 MED ORDER — ACETAMINOPHEN 325 MG PO TABS
650.0000 mg | ORAL_TABLET | Freq: Once | ORAL | Status: AC
  Administered 2015-05-26: 650 mg via ORAL
  Filled 2015-05-26: qty 2

## 2015-05-26 MED ORDER — SODIUM CHLORIDE 0.9 % IV SOLN
Freq: Once | INTRAVENOUS | Status: AC
  Administered 2015-05-26: 09:00:00 via INTRAVENOUS

## 2015-05-26 MED ORDER — HEPARIN SOD (PORK) LOCK FLUSH 100 UNIT/ML IV SOLN
500.0000 [IU] | Freq: Once | INTRAVENOUS | Status: DC
  Filled 2015-05-26: qty 5

## 2015-05-26 MED ORDER — DIPHENHYDRAMINE HCL 25 MG PO CAPS
25.0000 mg | ORAL_CAPSULE | Freq: Once | ORAL | Status: AC
  Administered 2015-05-26: 25 mg via ORAL
  Filled 2015-05-26: qty 1

## 2015-05-26 MED ORDER — SODIUM CHLORIDE 0.9 % IJ SOLN: 10.0000 mL | INTRAMUSCULAR | Status: DC | PRN

## 2015-05-26 NOTE — Procedures (Signed)
Diagnosis: Anemia associated with chemotherapy E993.1 MD: Lindi Adie Procedure Note: Port accessed, pre blood medication administered, blood return noted, 1 unit PRB's transfused, and de accessed port per protocol. Condition during procedure: Tolerated well Condition after procedure: Alert, oriented, and ambulatory; accompanied by spouse.

## 2015-05-26 NOTE — Progress Notes (Signed)
Pt temperature upon arrival for blood transfusion was 101 degrees; Dr. Geralyn Flash office notified; will continue to monitor

## 2015-05-26 NOTE — Progress Notes (Signed)
Beverlee Nims, RN, called from Dr. Geralyn Flash office; states to give patient tylenol as ordered and transfuse unit of blood; will continue to monitor

## 2015-05-28 LAB — TYPE AND SCREEN
ABO/RH(D): A POS
ANTIBODY SCREEN: NEGATIVE
Unit division: 0

## 2015-05-31 ENCOUNTER — Ambulatory Visit (HOSPITAL_BASED_OUTPATIENT_CLINIC_OR_DEPARTMENT_OTHER): Payer: Medicare Other

## 2015-05-31 ENCOUNTER — Other Ambulatory Visit (HOSPITAL_BASED_OUTPATIENT_CLINIC_OR_DEPARTMENT_OTHER): Payer: Medicare Other

## 2015-05-31 ENCOUNTER — Ambulatory Visit: Payer: Medicare Other

## 2015-05-31 ENCOUNTER — Telehealth: Payer: Self-pay | Admitting: Nurse Practitioner

## 2015-05-31 ENCOUNTER — Encounter: Payer: Self-pay | Admitting: Nurse Practitioner

## 2015-05-31 ENCOUNTER — Ambulatory Visit (HOSPITAL_BASED_OUTPATIENT_CLINIC_OR_DEPARTMENT_OTHER): Payer: Medicare Other | Admitting: Nurse Practitioner

## 2015-05-31 ENCOUNTER — Ambulatory Visit (HOSPITAL_COMMUNITY)
Admission: RE | Admit: 2015-05-31 | Discharge: 2015-05-31 | Disposition: A | Discharge status: Home / Self Care | Payer: Medicare Other | Source: Ambulatory Visit | Attending: Nurse Practitioner | Admitting: Nurse Practitioner

## 2015-05-31 ENCOUNTER — Other Ambulatory Visit: Payer: Self-pay | Admitting: Nurse Practitioner

## 2015-05-31 VITALS — BP 114/67 | HR 98 | Temp 98.4°F | Resp 18 | Ht 65.0 in | Wt 225.2 lb

## 2015-05-31 DIAGNOSIS — D630 Anemia in neoplastic disease: Secondary | ICD-10-CM

## 2015-05-31 DIAGNOSIS — R5383 Other fatigue: Secondary | ICD-10-CM

## 2015-05-31 DIAGNOSIS — C50412 Malignant neoplasm of upper-outer quadrant of left female breast: Secondary | ICD-10-CM | POA: Diagnosis not present

## 2015-05-31 DIAGNOSIS — D649 Anemia, unspecified: Secondary | ICD-10-CM | POA: Diagnosis not present

## 2015-05-31 DIAGNOSIS — Z5111 Encounter for antineoplastic chemotherapy: Secondary | ICD-10-CM | POA: Diagnosis not present

## 2015-05-31 DIAGNOSIS — R600 Localized edema: Secondary | ICD-10-CM | POA: Diagnosis not present

## 2015-05-31 DIAGNOSIS — R509 Fever, unspecified: Secondary | ICD-10-CM | POA: Insufficient documentation

## 2015-05-31 DIAGNOSIS — C773 Secondary and unspecified malignant neoplasm of axilla and upper limb lymph nodes: Secondary | ICD-10-CM

## 2015-05-31 DIAGNOSIS — R110 Nausea: Secondary | ICD-10-CM

## 2015-05-31 DIAGNOSIS — I959 Hypotension, unspecified: Secondary | ICD-10-CM

## 2015-05-31 DIAGNOSIS — Z95828 Presence of other vascular implants and grafts: Secondary | ICD-10-CM

## 2015-05-31 DIAGNOSIS — D6481 Anemia due to antineoplastic chemotherapy: Secondary | ICD-10-CM | POA: Diagnosis not present

## 2015-05-31 LAB — URINALYSIS, MICROSCOPIC - CHCC
Bilirubin (Urine): NEGATIVE
Blood: NEGATIVE
Glucose: NEGATIVE mg/dL
KETONES: NEGATIVE mg/dL
LEUKOCYTE ESTERASE: NEGATIVE
Nitrite: NEGATIVE
Specific Gravity, Urine: 1.015 (ref 1.003–1.035)
Urobilinogen, UR: 0.2 mg/dL (ref 0.2–1)
pH: 5 (ref 4.6–8.0)

## 2015-05-31 LAB — CBC WITH DIFFERENTIAL/PLATELET
BASO%: 0.6 % (ref 0.0–2.0)
BASOS ABS: 0.1 10*3/uL (ref 0.0–0.1)
EOS ABS: 0 10*3/uL (ref 0.0–0.5)
EOS%: 0.2 % (ref 0.0–7.0)
HCT: 24.3 % — ABNORMAL LOW (ref 34.8–46.6)
HGB: 8 g/dL — ABNORMAL LOW (ref 11.6–15.9)
LYMPH%: 7.9 % — ABNORMAL LOW (ref 14.0–49.7)
MCH: 28.9 pg (ref 25.1–34.0)
MCHC: 32.9 g/dL (ref 31.5–36.0)
MCV: 87.7 fL (ref 79.5–101.0)
MONO#: 1.2 10*3/uL — AB (ref 0.1–0.9)
MONO%: 11.2 % (ref 0.0–14.0)
NEUT#: 8.3 10*3/uL — ABNORMAL HIGH (ref 1.5–6.5)
NEUT%: 80.1 % — AB (ref 38.4–76.8)
PLATELETS: 428 10*3/uL — AB (ref 145–400)
RBC: 2.77 10*6/uL — AB (ref 3.70–5.45)
RDW: 18.4 % — ABNORMAL HIGH (ref 11.2–14.5)
WBC: 10.4 10*3/uL — AB (ref 3.9–10.3)
lymph#: 0.8 10*3/uL — ABNORMAL LOW (ref 0.9–3.3)

## 2015-05-31 LAB — COMPREHENSIVE METABOLIC PANEL (CC13)
ALT: 16 U/L (ref 0–55)
AST: 30 U/L (ref 5–34)
Albumin: 2.1 g/dL — ABNORMAL LOW (ref 3.5–5.0)
Alkaline Phosphatase: 72 U/L (ref 40–150)
Anion Gap: 10 mEq/L (ref 3–11)
BUN: 30.8 mg/dL — AB (ref 7.0–26.0)
CO2: 26 mEq/L (ref 22–29)
Calcium: 8.9 mg/dL (ref 8.4–10.4)
Chloride: 101 mEq/L (ref 98–109)
Creatinine: 1.3 mg/dL — ABNORMAL HIGH (ref 0.6–1.1)
EGFR: 44 mL/min/{1.73_m2} — ABNORMAL LOW (ref >90)
GLUCOSE: 154 mg/dL — AB (ref 70–140)
POTASSIUM: 4.3 meq/L (ref 3.5–5.1)
SODIUM: 137 meq/L (ref 136–145)
Total Bilirubin: 0.42 mg/dL (ref 0.20–1.20)
Total Protein: 6 g/dL — ABNORMAL LOW (ref 6.4–8.3)

## 2015-05-31 LAB — HOLD TUBE, BLOOD BANK

## 2015-05-31 LAB — PREPARE RBC (CROSSMATCH)

## 2015-05-31 MED ORDER — SODIUM CHLORIDE 0.9 % IV SOLN
Freq: Once | INTRAVENOUS | Status: AC
  Administered 2015-05-31: 12:00:00 via INTRAVENOUS
  Filled 2015-05-31: qty 4

## 2015-05-31 MED ORDER — SODIUM CHLORIDE 0.9 % IJ SOLN
10.0000 mL | INTRAMUSCULAR | Status: DC | PRN
  Administered 2015-05-31: 10 mL via INTRAVENOUS
  Filled 2015-05-31: qty 10

## 2015-05-31 MED ORDER — PACLITAXEL PROTEIN-BOUND CHEMO INJECTION 100 MG
100.0000 mg/m2 | Freq: Once | INTRAVENOUS | Status: AC
  Administered 2015-05-31: 225 mg via INTRAVENOUS
  Filled 2015-05-31: qty 45

## 2015-05-31 MED ORDER — SODIUM CHLORIDE 0.9 % IV SOLN
Freq: Once | INTRAVENOUS | Status: AC
  Administered 2015-05-31: 12:00:00 via INTRAVENOUS

## 2015-05-31 MED ORDER — HEPARIN SOD (PORK) LOCK FLUSH 100 UNIT/ML IV SOLN
500.0000 [IU] | Freq: Once | INTRAVENOUS | Status: DC
  Filled 2015-05-31: qty 5

## 2015-05-31 MED ORDER — HEPARIN SOD (PORK) LOCK FLUSH 100 UNIT/ML IV SOLN
500.0000 [IU] | Freq: Once | INTRAVENOUS | Status: AC | PRN
  Administered 2015-05-31: 500 [IU]
  Filled 2015-05-31: qty 5

## 2015-05-31 MED ORDER — SODIUM CHLORIDE 0.9 % IJ SOLN
10.0000 mL | INTRAMUSCULAR | Status: DC | PRN
  Administered 2015-05-31: 10 mL
  Filled 2015-05-31: qty 10

## 2015-05-31 NOTE — Progress Notes (Signed)
Pt to have blood cultures and type and screen prior to infusion in lab.  Order also received for urine and CXR.  OK to treat today per H. Boelter NP.

## 2015-05-31 NOTE — Patient Instructions (Signed)

## 2015-05-31 NOTE — Patient Instructions (Signed)
[facility] Discharge Instructions for Patients Receiving Chemotherapy  Today you received the following chemotherapy agent: Abraxane   To help prevent nausea and vomiting after your treatment, we encourage you to take your nausea medication as prescrobed.    If you develop nausea and vomiting that is not controlled by your nausea medication, call the clinic.   BELOW ARE SYMPTOMS THAT SHOULD BE REPORTED IMMEDIATELY:  *FEVER GREATER THAN 100.5 F  *CHILLS WITH OR WITHOUT FEVER  NAUSEA AND VOMITING THAT IS NOT CONTROLLED WITH YOUR NAUSEA MEDICATION  *UNUSUAL SHORTNESS OF BREATH  *UNUSUAL BRUISING OR BLEEDING  TENDERNESS IN MOUTH AND THROAT WITH OR WITHOUT PRESENCE OF ULCERS  *URINARY PROBLEMS  *BOWEL PROBLEMS  UNUSUAL RASH Items with * indicate a potential emergency and should be followed up as soon as possible.  Feel free to call the clinic you have any questions or concerns. The clinic phone number is [phone].  Please show the [facility] at check-in to the Emergency Department and triage nurse.  Nanoparticle Albumin-Bound Paclitaxel injection What is this medicine? NANOPARTICLE ALBUMIN-BOUND PACLITAXEL (Na no PAHR ti kuhl al BYOO muhn-bound PAK li TAX el) is a chemotherapy drug. It targets fast dividing cells, like cancer cells, and causes these cells to die. This medicine is used to treat advanced breast cancer and advanced lung cancer. This medicine may be used for other purposes; ask your health care provider or pharmacist if you have questions. COMMON BRAND NAME(S): Abraxane What should I tell my health care provider before I take this medicine? They need to know if you have any of these conditions: -kidney disease -liver disease -low blood counts, like low platelets, red blood cells, or white blood cells -recent or ongoing radiation therapy -an unusual or allergic reaction to paclitaxel, albumin, other chemotherapy, other medicines, foods,  dyes, or preservatives -pregnant or trying to get pregnant -breast-feeding How should I use this medicine? This drug is given as an infusion into a vein. It is administered in a hospital or clinic by a specially trained health care professional. Talk to your pediatrician regarding the use of this medicine in children. Special care may be needed. Overdosage: If you think you have taken too much of this medicine contact a poison control center or emergency room at once. NOTE: This medicine is only for you. Do not share this medicine with others. What if I miss a dose? It is important not to miss your dose. Call your doctor or health care professional if you are unable to keep an appointment. What may interact with this medicine? -cyclosporine -diazepam -ketoconazole -medicines to increase blood counts like filgrastim, pegfilgrastim, sargramostim -other chemotherapy drugs like cisplatin, doxorubicin, epirubicin, etoposide, teniposide, vincristine -quinidine -testosterone -vaccines -verapamil Talk to your doctor or health care professional before taking any of these medicines: -acetaminophen -aspirin -ibuprofen -ketoprofen -naproxen This list may not describe all possible interactions. Give your health care provider a list of all the medicines, herbs, non-prescription drugs, or dietary supplements you use. Also tell them if you smoke, drink alcohol, or use illegal drugs. Some items may interact with your medicine. What should I watch for while using this medicine? Your condition will be monitored carefully while you are receiving this medicine. You will need important blood work done while you are taking this medicine. This drug may make you feel generally unwell. This is not uncommon, as chemotherapy can affect healthy cells as well as cancer cells. Report any side effects. Continue your course of  treatment even though you feel ill unless your doctor tells you to stop. In some cases, you  may be given additional medicines to help with side effects. Follow all directions for their use. Call your doctor or health care professional for advice if you get a fever, chills or sore throat, or other symptoms of a cold or flu. Do not treat yourself. This drug decreases your body's ability to fight infections. Try to avoid being around people who are sick. This medicine may increase your risk to bruise or bleed. Call your doctor or health care professional if you notice any unusual bleeding. Be careful brushing and flossing your teeth or using a toothpick because you may get an infection or bleed more easily. If you have any dental work done, tell your dentist you are receiving this medicine. Avoid taking products that contain aspirin, acetaminophen, ibuprofen, naproxen, or ketoprofen unless instructed by your doctor. These medicines may hide a fever. Do not become pregnant while taking this medicine. Women should inform their doctor if they wish to become pregnant or think they might be pregnant. There is a potential for serious side effects to an unborn child. Talk to your health care professional or pharmacist for more information. Do not breast-feed an infant while taking this medicine. Men are advised not to father a child while receiving this medicine. What side effects may I notice from receiving this medicine? Side effects that you should report to your doctor or health care professional as soon as possible: -allergic reactions like skin rash, itching or hives, swelling of the face, lips, or tongue -low blood counts - This drug may decrease the number of white blood cells, red blood cells and platelets. You may be at increased risk for infections and bleeding. -signs of infection - fever or chills, cough, sore throat, pain or difficulty passing urine -signs of decreased platelets or bleeding - bruising, pinpoint red spots on the skin, black, tarry stools, nosebleeds -signs of decreased red  blood cells - unusually weak or tired, fainting spells, lightheadedness -breathing problems -changes in vision -chest pain -high or low blood pressure -mouth sores -nausea and vomiting -pain, swelling, redness or irritation at the injection site -pain, tingling, numbness in the hands or feet -slow or irregular heartbeat -swelling of the ankle, feet, hands Side effects that usually do not require medical attention (report to your doctor or health care professional if they continue or are bothersome): -aches, pains -changes in the color of fingernails -diarrhea -hair loss -loss of appetite This list may not describe all possible side effects. Call your doctor for medical advice about side effects. You may report side effects to FDA at 1-800-FDA-1088. Where should I keep my medicine? This drug is given in a hospital or clinic and will not be stored at home. NOTE: This sheet is a summary. It may not cover all possible information. If you have questions about this medicine, talk to your doctor, pharmacist, or health care provider.  2015, [employer] Standard. (2013-02-01 16:48:50)

## 2015-05-31 NOTE — Progress Notes (Signed)
Urine culture obtained in infusion room, walked to lab by Maudie Mercury, NT.

## 2015-05-31 NOTE — Telephone Encounter (Signed)
Appointments made and patient will get a new avs in chemo °

## 2015-05-31 NOTE — Progress Notes (Signed)
Patient Care Team: Midge Minium, MD as PCP - General (Family Medicine) Mackie Pai, PA-C as Physician Assistant (Physician Assistant)  DIAGNOSIS: No matching staging information was found for the patient.  SUMMARY OF ONCOLOGIC HISTORY:   Breast cancer of upper-outer quadrant of left female breast   03/02/2015 Initial Diagnosis Left breast biopsy 1:00: Invasive mammary cancer, grade 3, 1 lymph node biopsy in the axilla positive, ER 12%, PR 0%, Ki-67 100%, HER-2 negative ratio 1.53   03/08/2015 Breast MRI Left breast 1:00 position: 3.6 cm mass with enlarged axillary lymph nodes multiple levels largest 4 cm, total mass measures 10.2 cm   03/30/2015 -  Neo-Adjuvant Chemotherapy Adriamycin Cytoxan 4 followed by Abraxane weekly 12    CHIEF COMPLIANT: to start abraxane  INTERVAL HISTORY: Cassandra Maldonado is a 69 year old with above-mentioned history of left breast cancer currently on neoadjuvant chemotherapy. She completed 4 cycles of Adriamycin and Cytoxan. Today she is to begin abraxane weekly x 12. She continues to have fevers, but only at night. She has chills as well but this all resolves with tylenol. During the day she is afebrile and asymptomatic. She received 1 unit of blood last week, but is even more short of breath this week. She can barely make it from her kitchen to the living room without stopping for a break. Her bilateral lower extremities are edematous and she has not had her BP meds in several weeks because of low pressures.  REVIEW OF SYSTEMS:   Constitutional: denies chills, or unintentional weight loss, fevers today Eyes: Denies blurriness of vision Ears, nose, mouth, throat, and face: Denies mucositis or sore throat Respiratory: Denies cough, dyspnea or wheezes Cardiovascular: Denies palpitation, chest discomfort or lower extremity swelling Gastrointestinal:  Denies nausea, heartburn or change in bowel habits Skin: Denies abnormal skin rashes Lymphatics: Denies new  lymphadenopathy or easy bruising Neurological:Denies numbness, tingling or new weaknesses Behavioral/Psych: Mood is stable, no new changes  Breast:  denies any pain or lumps or nodules in either breasts All other systems were reviewed with the patient and are negative.  I have reviewed the past medical history, past surgical history, social history and family history with the patient and they are unchanged from previous note.  ALLERGIES:  has No Known Allergies.  MEDICATIONS:  Current Outpatient Prescriptions  Medication Sig Dispense Refill  . allopurinol (ZYLOPRIM) 100 MG tablet TAKE ONE TABLET BY MOUTH TWICE DAILY 60 tablet 1  . levofloxacin (LEVAQUIN) 500 MG tablet Take 1 tablet (500 mg total) by mouth daily. 7 tablet 0  . albuterol (PROVENTIL HFA;VENTOLIN HFA) 108 (90 BASE) MCG/ACT inhaler Inhale 2 puffs into the lungs every 6 (six) hours as needed for wheezing or shortness of breath. (Patient not taking: Reported on 05/25/2015) 1 Inhaler 0  . benzonatate (TESSALON) 200 MG capsule Take 1 capsule (200 mg total) by mouth 3 (three) times daily as needed for cough. (Patient not taking: Reported on 05/31/2015) 30 capsule 0  . lisinopril-hydrochlorothiazide (PRINZIDE,ZESTORETIC) 10-12.5 MG per tablet TAKE ONE TABLET BY MOUTH ONCE DAILY (Patient not taking: Reported on 05/25/2015) 90 tablet 1  . LORazepam (ATIVAN) 0.5 MG tablet Take 1 tablet (0.5 mg total) by mouth every 6 (six) hours as needed (Nausea or vomiting). 30 tablet 0  . nystatin (MYCOSTATIN) 100000 UNIT/ML suspension Take 5 mLs (500,000 Units total) by mouth 4 (four) times daily. 60 mL 0   No current facility-administered medications for this visit.    PHYSICAL EXAMINATION: ECOG PERFORMANCE STATUS: 1 - Symptomatic but completely  ambulatory  Filed Vitals:   05/31/15 1022  BP: 114/67  Pulse: 98  Temp: 98.4 F (36.9 C)  Resp: 18   Filed Weights   05/31/15 1022  Weight: 225 lb 3.2 oz (102.15 kg)    GENERAL:alert, no distress and  comfortable SKIN: skin color, texture, turgor are normal, no rashes or significant lesions EYES: normal, Conjunctiva are pink and non-injected, sclera clear OROPHARYNX:no exudate, no erythema and lips, buccal mucosa, and tongue normal  NECK: supple, thyroid normal size, non-tender, without nodularity LYMPH:  no palpable lymphadenopathy in the cervical, axillary or inguinal LUNGS: clear to auscultation and percussion with normal breathing effort HEART: regular rate & rhythm and no murmurs and no lower extremity edema ABDOMEN:abdomen soft, non-tender and normal bowel sounds Musculoskeletal:no cyanosis of digits and no clubbing  NEURO: alert & oriented x 3 with fluent speech, no focal motor/sensory deficits   LABORATORY DATA:  I have reviewed the data as listed   Chemistry      Component Value Date/Time   NA 137 05/31/2015 0954   NA 133* 04/06/2015 0820   K 4.3 05/31/2015 0954   K 5.3* 04/06/2015 0820   CL 101 04/06/2015 0820   CO2 26 05/31/2015 0954   CO2 26 04/06/2015 0820   BUN 30.8* 05/31/2015 0954   BUN 38* 04/06/2015 0820   CREATININE 1.3* 05/31/2015 0954   CREATININE 1.30* 04/06/2015 0820      Component Value Date/Time   CALCIUM 8.9 05/31/2015 0954   CALCIUM 9.0 04/06/2015 0820   ALKPHOS 72 05/31/2015 0954   ALKPHOS 94 04/06/2015 0820   AST 30 05/31/2015 0954   AST 17 04/06/2015 0820   ALT 16 05/31/2015 0954   ALT 24 04/06/2015 0820   BILITOT 0.42 05/31/2015 0954   BILITOT 1.0 04/06/2015 0820       Lab Results  Component Value Date   WBC 10.4* 05/31/2015   HGB 8.0* 05/31/2015   HCT 24.3* 05/31/2015   MCV 87.7 05/31/2015   PLT 428* 05/31/2015   NEUTROABS 8.3* 05/31/2015    ASSESSMENT & PLAN:  Breast cancer of upper-outer quadrant of left female breast Left breast invasive mammary cancer 1:00 position: 3.1 x 2.9 x 3.6 cm tumor with multiple enlarged level MCCXIII left axillary lymph nodes largest measuring 4 cm per total congruent nor mass measures 10.2 cm  ER 12%, PR 0%, Ki-67 100%, HER-2 negative ratio 1.53 CT chest abdomen pelvis and bone scan 03/17/2015: No evidence of metastatic disease Treatment plan: neoadjuvant chemotherapy with dose dense Adriamycin and Cytoxan followed by Abraxane weekly 12. Current treatment: Today cycle 1, day 1 of abraxane.  Chemotherapy toxicities: 1. Severe neutropenia nadir counts: Decrease the dosage of chemotherapy for cycle 2 Adriamycin and Cytoxan 2. Thrush prescribed nystatin 3. Fatigue: Combination of chemotherapy, anemia 4. Intermittent nausea: treated with compazine 5. Anemia: 1 unit of blood given last week. hgb up to 8.0. Progressive shortness of breath, to be given 2 units of blood tomorrow or Friday 6. Dehydration: Encourage her to increase her fluid intake.  6. Leg cramps: Most likely related to dehydration. Encouraged her to drink more fluids. 7. Hypotension: asked her to discontinue lisinopril with hydrochlorothiazide. Once her blood pressure comes up we can resume her antihypertensive medication. 8. Fever: tylenol 650 PO 2-3 times daily. ANC 8.3. Treated with a course of amoxicillin and levaquin last week. Will check blood and urine cultures and a chest xray to rule out infection. 9. Bilateral leg/foot edema: likely as a result of being  off the HCTZ. BP still low. Will avoid sodium, elevated legs, and wear compression stockings.   Orders Placed This Encounter  Procedures  . Type and screen    Standing Status: Future     Number of Occurrences:      Standing Expiration Date: 05/30/2016   The patient has a good understanding of the overall plan. she agrees with it. she will call with any problems that may develop before the next visit here.   Laurie Panda, NP

## 2015-06-01 ENCOUNTER — Ambulatory Visit (HOSPITAL_BASED_OUTPATIENT_CLINIC_OR_DEPARTMENT_OTHER): Payer: Medicare Other

## 2015-06-01 ENCOUNTER — Ambulatory Visit: Payer: Medicare Other

## 2015-06-01 ENCOUNTER — Other Ambulatory Visit: Payer: Self-pay | Admitting: Nurse Practitioner

## 2015-06-01 ENCOUNTER — Telehealth: Payer: Self-pay | Admitting: *Deleted

## 2015-06-01 ENCOUNTER — Other Ambulatory Visit: Payer: Medicare Other

## 2015-06-01 VITALS — BP 121/72 | HR 89 | Temp 98.9°F | Resp 18

## 2015-06-01 DIAGNOSIS — D630 Anemia in neoplastic disease: Secondary | ICD-10-CM

## 2015-06-01 DIAGNOSIS — D6481 Anemia due to antineoplastic chemotherapy: Secondary | ICD-10-CM

## 2015-06-01 LAB — URINE CULTURE

## 2015-06-01 MED ORDER — SODIUM CHLORIDE 0.9 % IJ SOLN
10.0000 mL | INTRAMUSCULAR | Status: AC | PRN
  Administered 2015-06-01: 10 mL
  Filled 2015-06-01: qty 10

## 2015-06-01 MED ORDER — DIPHENHYDRAMINE HCL 25 MG PO CAPS
ORAL_CAPSULE | ORAL | Status: AC
  Filled 2015-06-01: qty 1

## 2015-06-01 MED ORDER — HEPARIN SOD (PORK) LOCK FLUSH 100 UNIT/ML IV SOLN
500.0000 [IU] | Freq: Every day | INTRAVENOUS | Status: AC | PRN
  Administered 2015-06-01: 500 [IU]
  Filled 2015-06-01: qty 5

## 2015-06-01 MED ORDER — DIPHENHYDRAMINE HCL 25 MG PO CAPS
25.0000 mg | ORAL_CAPSULE | Freq: Once | ORAL | Status: AC
  Administered 2015-06-01: 25 mg via ORAL

## 2015-06-01 MED ORDER — SODIUM CHLORIDE 0.9 % IV SOLN
250.0000 mL | Freq: Once | INTRAVENOUS | Status: AC
  Administered 2015-06-01: 250 mL via INTRAVENOUS

## 2015-06-01 NOTE — Telephone Encounter (Signed)
Patient received 1st Abraxane yesterday. Patient is receiving 2 units of blood today. States she has some nausea but is taking her anti-nausea meds. Eating and drinking well, denies any vomiting or diarrhea. Patient advised to call with any questions or concerns. She verbalized understanding.

## 2015-06-01 NOTE — Patient Instructions (Signed)

## 2015-06-01 NOTE — Progress Notes (Signed)
Patient reports taking compazine for feeling "queasy" and tylenol (1000mg ) due to feeling "cold." Healther, NP notified of tylenol dose, OK to hold blood premed dose of tylenol. Patient requesting results of labs/tests from 05/31/15; per NP, urine and chest xray are negative for infection. Patient notified and she verbalizes understanding.

## 2015-06-02 LAB — TYPE AND SCREEN
ABO/RH(D): A POS
Antibody Screen: NEGATIVE
Unit division: 0
Unit division: 0

## 2015-06-06 LAB — CULTURE, BLOOD (SINGLE)

## 2015-06-08 ENCOUNTER — Encounter: Payer: Self-pay | Admitting: Nurse Practitioner

## 2015-06-08 ENCOUNTER — Ambulatory Visit (HOSPITAL_BASED_OUTPATIENT_CLINIC_OR_DEPARTMENT_OTHER): Payer: Medicare Other | Admitting: Nurse Practitioner

## 2015-06-08 ENCOUNTER — Other Ambulatory Visit (HOSPITAL_BASED_OUTPATIENT_CLINIC_OR_DEPARTMENT_OTHER): Payer: Medicare Other

## 2015-06-08 ENCOUNTER — Ambulatory Visit (HOSPITAL_BASED_OUTPATIENT_CLINIC_OR_DEPARTMENT_OTHER): Payer: Medicare Other

## 2015-06-08 VITALS — BP 125/69 | HR 90 | Temp 98.9°F | Resp 18 | Wt 223.2 lb

## 2015-06-08 DIAGNOSIS — T451X5A Adverse effect of antineoplastic and immunosuppressive drugs, initial encounter: Secondary | ICD-10-CM

## 2015-06-08 DIAGNOSIS — C50412 Malignant neoplasm of upper-outer quadrant of left female breast: Secondary | ICD-10-CM

## 2015-06-08 DIAGNOSIS — C773 Secondary and unspecified malignant neoplasm of axilla and upper limb lymph nodes: Secondary | ICD-10-CM

## 2015-06-08 DIAGNOSIS — D649 Anemia, unspecified: Secondary | ICD-10-CM

## 2015-06-08 DIAGNOSIS — R600 Localized edema: Secondary | ICD-10-CM

## 2015-06-08 DIAGNOSIS — K1230 Oral mucositis (ulcerative), unspecified: Secondary | ICD-10-CM | POA: Insufficient documentation

## 2015-06-08 DIAGNOSIS — Z5111 Encounter for antineoplastic chemotherapy: Secondary | ICD-10-CM | POA: Diagnosis not present

## 2015-06-08 DIAGNOSIS — D6481 Anemia due to antineoplastic chemotherapy: Secondary | ICD-10-CM | POA: Insufficient documentation

## 2015-06-08 DIAGNOSIS — B379 Candidiasis, unspecified: Secondary | ICD-10-CM

## 2015-06-08 LAB — COMPREHENSIVE METABOLIC PANEL (CC13)
ALBUMIN: 2.5 g/dL — AB (ref 3.5–5.0)
ALT: 27 U/L (ref 0–55)
AST: 31 U/L (ref 5–34)
Alkaline Phosphatase: 81 U/L (ref 40–150)
Anion Gap: 8 mEq/L (ref 3–11)
BUN: 15.6 mg/dL (ref 7.0–26.0)
CO2: 27 mEq/L (ref 22–29)
Calcium: 9.2 mg/dL (ref 8.4–10.4)
Chloride: 103 mEq/L (ref 98–109)
Creatinine: 0.9 mg/dL (ref 0.6–1.1)
EGFR: 65 mL/min/{1.73_m2} — ABNORMAL LOW (ref >90)
GLUCOSE: 122 mg/dL (ref 70–140)
POTASSIUM: 4.3 meq/L (ref 3.5–5.1)
SODIUM: 138 meq/L (ref 136–145)
TOTAL PROTEIN: 6.2 g/dL — AB (ref 6.4–8.3)
Total Bilirubin: 0.58 mg/dL (ref 0.20–1.20)

## 2015-06-08 LAB — CBC WITH DIFFERENTIAL/PLATELET
BASO%: 0.6 % (ref 0.0–2.0)
BASOS ABS: 0 10*3/uL (ref 0.0–0.1)
EOS%: 0.6 % (ref 0.0–7.0)
Eosinophils Absolute: 0 10*3/uL (ref 0.0–0.5)
HEMATOCRIT: 29.4 % — AB (ref 34.8–46.6)
HGB: 9.5 g/dL — ABNORMAL LOW (ref 11.6–15.9)
LYMPH%: 11 % — AB (ref 14.0–49.7)
MCH: 29.5 pg (ref 25.1–34.0)
MCHC: 32.3 g/dL (ref 31.5–36.0)
MCV: 91.3 fL (ref 79.5–101.0)
MONO#: 0.8 10*3/uL (ref 0.1–0.9)
MONO%: 12.1 % (ref 0.0–14.0)
NEUT#: 4.8 10*3/uL (ref 1.5–6.5)
NEUT%: 75.7 % (ref 38.4–76.8)
Platelets: 316 10*3/uL (ref 145–400)
RBC: 3.22 10*6/uL — AB (ref 3.70–5.45)
RDW: 18.1 % — AB (ref 11.2–14.5)
WBC: 6.3 10*3/uL (ref 3.9–10.3)
lymph#: 0.7 10*3/uL — ABNORMAL LOW (ref 0.9–3.3)

## 2015-06-08 MED ORDER — SODIUM CHLORIDE 0.9 % IJ SOLN
10.0000 mL | INTRAMUSCULAR | Status: DC | PRN
  Administered 2015-06-08: 10 mL
  Filled 2015-06-08: qty 10

## 2015-06-08 MED ORDER — PACLITAXEL PROTEIN-BOUND CHEMO INJECTION 100 MG
100.0000 mg/m2 | Freq: Once | INTRAVENOUS | Status: AC
  Administered 2015-06-08: 225 mg via INTRAVENOUS
  Filled 2015-06-08: qty 45

## 2015-06-08 MED ORDER — ACYCLOVIR 400 MG PO TABS: 400.0000 mg | ORAL_TABLET | Freq: Two times a day (BID) | ORAL | Status: DC

## 2015-06-08 MED ORDER — SODIUM CHLORIDE 0.9 % IV SOLN
Freq: Once | INTRAVENOUS | Status: AC
  Administered 2015-06-08: 12:00:00 via INTRAVENOUS
  Filled 2015-06-08: qty 4

## 2015-06-08 MED ORDER — SODIUM CHLORIDE 0.9 % IV SOLN
Freq: Once | INTRAVENOUS | Status: AC
  Administered 2015-06-08: 12:00:00 via INTRAVENOUS

## 2015-06-08 MED ORDER — HEPARIN SOD (PORK) LOCK FLUSH 100 UNIT/ML IV SOLN
500.0000 [IU] | Freq: Once | INTRAVENOUS | Status: AC | PRN
  Administered 2015-06-08: 500 [IU]
  Filled 2015-06-08: qty 5

## 2015-06-08 NOTE — Patient Instructions (Signed)
[facility] Discharge Instructions for Patients Receiving Chemotherapy  Today you received the following chemotherapy agents abraxane.   To help prevent nausea and vomiting after your treatment, we encourage you to take your nausea medication as directed.   If you develop nausea and vomiting that is not controlled by your nausea medication, call the clinic.   BELOW ARE SYMPTOMS THAT SHOULD BE REPORTED IMMEDIATELY:  *FEVER GREATER THAN 100.5 F  *CHILLS WITH OR WITHOUT FEVER  NAUSEA AND VOMITING THAT IS NOT CONTROLLED WITH YOUR NAUSEA MEDICATION  *UNUSUAL SHORTNESS OF BREATH  *UNUSUAL BRUISING OR BLEEDING  TENDERNESS IN MOUTH AND THROAT WITH OR WITHOUT PRESENCE OF ULCERS  *URINARY PROBLEMS  *BOWEL PROBLEMS  UNUSUAL RASH Items with * indicate a potential emergency and should be followed up as soon as possible.  Feel free to call the clinic you have any questions or concerns. The clinic phone number is [phone].  

## 2015-06-08 NOTE — Progress Notes (Signed)
Patient Care Team: Midge Minium, MD as PCP - General (Family Medicine) Mackie Pai, PA-C as Physician Assistant (Physician Assistant)  DIAGNOSIS: No matching staging information was found for the patient.  SUMMARY OF ONCOLOGIC HISTORY:   Breast cancer of upper-outer quadrant of left female breast   03/02/2015 Initial Diagnosis Left breast biopsy 1:00: Invasive mammary cancer, grade 3, 1 lymph node biopsy in the axilla positive, ER 12%, PR 0%, Ki-67 100%, HER-2 negative ratio 1.53   03/08/2015 Breast MRI Left breast 1:00 position: 3.6 cm mass with enlarged axillary lymph nodes multiple levels largest 4 cm, total mass measures 10.2 cm   03/30/2015 -  Neo-Adjuvant Chemotherapy Adriamycin Cytoxan 4 followed by Abraxane weekly 12    CHIEF COMPLIANT: Abraxane Week 2  INTERVAL HISTORY: Cassandra Maldonado is a 69 year old with above-mentioned history of left breast cancer currently on neoadjuvant chemotherapy. She completed 4 cycles of Adriamycin and Cytoxan. She is status post her first cycle of Abraxane and tolerated this drug well. She denies nausea, vomiting, or changes in bowel or bladder habits. Her chest xray, blood cultures, and urine culture returned negative, and her fevers finally stopped. Her appetite is decreased but she supplements with 1-2 Boost shakes daily. She does have sores to her tongue that make it difficult to eat. She is slowing losing weight. She tried to buy compression stockings but bought a compression strength that was too high and she was unable to get them on.   REVIEW OF SYSTEMS:   Constitutional: denies chills, fevers today, mild weight loss Eyes: Denies blurriness of vision Ears, nose, mouth, throat, and face: grade 1 mucositis Respiratory: Denies cough, dyspnea or wheezes Cardiovascular: Denies palpitation, chest discomfort or lower extremity swelling Gastrointestinal:  Denies nausea, heartburn or change in bowel habits Skin: Denies abnormal skin  rashes Lymphatics: Denies new lymphadenopathy or easy bruising Neurological:Denies numbness, tingling or new weaknesses Behavioral/Psych: Mood is stable, no new changes  Breast:  denies any pain or lumps or nodules in either breasts All other systems were reviewed with the patient and are negative.  I have reviewed the past medical history, past surgical history, social history and family history with the patient and they are unchanged from previous note.  ALLERGIES:  has No Known Allergies.  MEDICATIONS:  Current Outpatient Prescriptions  Medication Sig Dispense Refill  . allopurinol (ZYLOPRIM) 100 MG tablet TAKE ONE TABLET BY MOUTH TWICE DAILY 60 tablet 1  . nystatin (MYCOSTATIN) 100000 UNIT/ML suspension Take 5 mLs (500,000 Units total) by mouth 4 (four) times daily. 60 mL 0  . acyclovir (ZOVIRAX) 400 MG tablet Take 1 tablet (400 mg total) by mouth 2 (two) times daily. 60 tablet 2  . albuterol (PROVENTIL HFA;VENTOLIN HFA) 108 (90 BASE) MCG/ACT inhaler Inhale 2 puffs into the lungs every 6 (six) hours as needed for wheezing or shortness of breath. (Patient not taking: Reported on 05/25/2015) 1 Inhaler 0  . benzonatate (TESSALON) 200 MG capsule Take 1 capsule (200 mg total) by mouth 3 (three) times daily as needed for cough. (Patient not taking: Reported on 05/31/2015) 30 capsule 0  . lisinopril-hydrochlorothiazide (PRINZIDE,ZESTORETIC) 10-12.5 MG per tablet TAKE ONE TABLET BY MOUTH ONCE DAILY (Patient not taking: Reported on 05/25/2015) 90 tablet 1  . LORazepam (ATIVAN) 0.5 MG tablet Take 1 tablet (0.5 mg total) by mouth every 6 (six) hours as needed (Nausea or vomiting). (Patient not taking: Reported on 06/08/2015) 30 tablet 0   No current facility-administered medications for this visit.   Facility-Administered Medications Ordered  in Other Visits  Medication Dose Route Frequency Provider Last Rate Last Dose  . heparin lock flush 100 unit/mL  500 Units Intracatheter Once PRN Nicholas Lose, MD       . PACLitaxel-protein bound (ABRAXANE) chemo infusion 225 mg  100 mg/m2 (Treatment Plan Actual) Intravenous Once Nicholas Lose, MD      . sodium chloride 0.9 % injection 10 mL  10 mL Intracatheter PRN Nicholas Lose, MD        PHYSICAL EXAMINATION: ECOG PERFORMANCE STATUS: 1 - Symptomatic but completely ambulatory  Filed Vitals:   06/08/15 1124  BP: 125/69  Pulse: 90  Temp: 98.9 F (37.2 C)  Resp: 18   Filed Weights   06/08/15 1124  Weight: 223 lb 3.2 oz (101.243 kg)    GENERAL:alert, no distress and comfortable SKIN: skin color, texture, turgor are normal, no rashes or significant lesions EYES: normal, Conjunctiva are pink and non-injected, sclera clear OROPHARYNX:no exudate, no erythema and lips, buccal mucosa. Sores to dorsal side of tongue and in both corners of the mouth. NECK: supple, thyroid normal size, non-tender, without nodularity LYMPH:  no palpable lymphadenopathy in the cervical, axillary or inguinal LUNGS: clear to auscultation and percussion with normal breathing effort HEART: regular rate & rhythm and no murmurs, +2 bilateral lower extremity edema ABDOMEN:abdomen soft, non-tender and normal bowel sounds Musculoskeletal:no cyanosis of digits and no clubbing  NEURO: alert & oriented x 3 with fluent speech, no focal motor/sensory deficits   LABORATORY DATA:  I have reviewed the data as listed   Chemistry      Component Value Date/Time   NA 138 06/08/2015 1105   NA 133* 04/06/2015 0820   K 4.3 06/08/2015 1105   K 5.3* 04/06/2015 0820   CL 101 04/06/2015 0820   CO2 27 06/08/2015 1105   CO2 26 04/06/2015 0820   BUN 15.6 06/08/2015 1105   BUN 38* 04/06/2015 0820   CREATININE 0.9 06/08/2015 1105   CREATININE 1.30* 04/06/2015 0820      Component Value Date/Time   CALCIUM 9.2 06/08/2015 1105   CALCIUM 9.0 04/06/2015 0820   ALKPHOS 81 06/08/2015 1105   ALKPHOS 94 04/06/2015 0820   AST 31 06/08/2015 1105   AST 17 04/06/2015 0820   ALT 27 06/08/2015 1105    ALT 24 04/06/2015 0820   BILITOT 0.58 06/08/2015 1105   BILITOT 1.0 04/06/2015 0820       Lab Results  Component Value Date   WBC 6.3 06/08/2015   HGB 9.5* 06/08/2015   HCT 29.4* 06/08/2015   MCV 91.3 06/08/2015   PLT 316 06/08/2015   NEUTROABS 4.8 06/08/2015    ASSESSMENT & PLAN:  Breast cancer of upper-outer quadrant of left female breast Left breast invasive mammary cancer 1:00 position: 3.1 x 2.9 x 3.6 cm tumor with multiple enlarged level MCCXIII left axillary lymph nodes largest measuring 4 cm per total congruent nor mass measures 10.2 cm ER 12%, PR 0%, Ki-67 100%, HER-2 negative ratio 1.53 CT chest abdomen pelvis and bone scan 03/17/2015: No evidence of metastatic disease Treatment plan: neoadjuvant chemotherapy with dose dense Adriamycin and Cytoxan followed by Abraxane weekly 12. Current treatment: Today cycle 2, day 1 of abraxane.  Chemotherapy toxicities: 1. Severe neutropenia nadir counts: Decrease the dosage of chemotherapy for cycle 2 Adriamycin and Cytoxan 2. Thrush prescribed nystatin 3. Fatigue: Combination of chemotherapy, anemia 4. Intermittent nausea: treated with compazine 5. Anemia: 2 units of blood given last week. hgb up to 9.5. 6. Dehydration: Encourage  her to increase her fluid intake.  6. Leg cramps: Most likely related to dehydration. Encouraged her to drink more fluids. 7. Hypotension: asked her to discontinue lisinopril with hydrochlorothiazide. Once her blood pressure comes up we can resume her antihypertensive medication. 8. Fever: resolved 9. Bilateral leg/foot edema: likely as a result of being off the HCTZ. BP still normal/stable. Will avoid sodium, elevated legs, and wear compression stockings. 10. Mucositis: acyclovir 470m BID, magic mouthwash q4hr PRN  Will return to clinic in 1 week for follow up visit with Dr. GLindi Adie May decide to reduce office visits to every other week at that time.   No orders of the defined types were placed in  this encounter.   The patient has a good understanding of the overall plan. she agrees with it. she will call with any problems that may develop before the next visit here.   HLaurie Panda NP

## 2015-06-14 NOTE — Assessment & Plan Note (Signed)
Left breast invasive mammary cancer 1:00 position: 3.1 x 2.9 x 3.6 cm tumor with multiple enlarged level MCCXIII left axillary lymph nodes largest measuring 4 cm per total congruent nor mass measures 10.2 cm ER 12%, PR 0%, Ki-67 100%, HER-2 negative ratio 1.53 CT chest abdomen pelvis and bone scan 03/17/2015: No evidence of metastatic disease Treatment plan: neoadjuvant chemotherapy with dose dense Adriamycin and Cytoxan followed by Abraxane weekly 12. Current treatment: Abraxane 3/12  Chemotherapy toxicities: 1. Severe neutropenia nadir counts: Decrease the dosage of chemotherapy for cycle 2 Adriamycin and Cytoxan 2. Thrush prescribed nystatin 3. Fatigue: Combination of chemotherapy, anemia 4. Intermittent nausea 5. Severe anemia hemoglobin progressively coming down: Today's hemoglobin 7.6 we will give her 1 unit of packed red cells. 6. Dehydration: Encourage her to increase her fluid intake. We will give her 500 cc normal saline today. 6. Leg cramps: Most likely related to dehydration. Encouraged her to drink more fluids. 7. Hypotension: I asked her to discontinue lisinopril with hydrochlorothiazide. Once her blood pressure comes up we can resume her antihypertensive medication.  Return to clinic in 2 week for week 5/12 Abraxane

## 2015-06-15 ENCOUNTER — Telehealth: Payer: Self-pay | Admitting: Hematology and Oncology

## 2015-06-15 ENCOUNTER — Ambulatory Visit (HOSPITAL_BASED_OUTPATIENT_CLINIC_OR_DEPARTMENT_OTHER): Payer: Medicare Other | Admitting: Hematology and Oncology

## 2015-06-15 ENCOUNTER — Ambulatory Visit (HOSPITAL_BASED_OUTPATIENT_CLINIC_OR_DEPARTMENT_OTHER): Payer: Medicare Other

## 2015-06-15 ENCOUNTER — Other Ambulatory Visit (HOSPITAL_BASED_OUTPATIENT_CLINIC_OR_DEPARTMENT_OTHER): Payer: Medicare Other

## 2015-06-15 ENCOUNTER — Encounter: Payer: Self-pay | Admitting: Hematology and Oncology

## 2015-06-15 VITALS — BP 128/73 | HR 79 | Temp 98.6°F | Resp 18 | Ht 65.0 in | Wt 222.9 lb

## 2015-06-15 DIAGNOSIS — C773 Secondary and unspecified malignant neoplasm of axilla and upper limb lymph nodes: Secondary | ICD-10-CM

## 2015-06-15 DIAGNOSIS — Z5111 Encounter for antineoplastic chemotherapy: Secondary | ICD-10-CM

## 2015-06-15 DIAGNOSIS — D6481 Anemia due to antineoplastic chemotherapy: Secondary | ICD-10-CM | POA: Diagnosis not present

## 2015-06-15 DIAGNOSIS — R609 Edema, unspecified: Secondary | ICD-10-CM

## 2015-06-15 DIAGNOSIS — C50412 Malignant neoplasm of upper-outer quadrant of left female breast: Secondary | ICD-10-CM

## 2015-06-15 LAB — CBC WITH DIFFERENTIAL/PLATELET
BASO%: 1.1 % (ref 0.0–2.0)
Basophils Absolute: 0 10*3/uL (ref 0.0–0.1)
EOS ABS: 0.1 10*3/uL (ref 0.0–0.5)
EOS%: 3.9 % (ref 0.0–7.0)
HEMATOCRIT: 30.1 % — AB (ref 34.8–46.6)
HGB: 9.5 g/dL — ABNORMAL LOW (ref 11.6–15.9)
LYMPH%: 25.8 % (ref 14.0–49.7)
MCH: 29.1 pg (ref 25.1–34.0)
MCHC: 31.6 g/dL (ref 31.5–36.0)
MCV: 92.3 fL (ref 79.5–101.0)
MONO#: 0.4 10*3/uL (ref 0.1–0.9)
MONO%: 11.9 % (ref 0.0–14.0)
NEUT#: 2.1 10*3/uL (ref 1.5–6.5)
NEUT%: 57.3 % (ref 38.4–76.8)
Platelets: 250 10*3/uL (ref 145–400)
RBC: 3.26 10*6/uL — AB (ref 3.70–5.45)
RDW: 17.7 % — ABNORMAL HIGH (ref 11.2–14.5)
WBC: 3.6 10*3/uL — AB (ref 3.9–10.3)
lymph#: 0.9 10*3/uL (ref 0.9–3.3)

## 2015-06-15 LAB — COMPREHENSIVE METABOLIC PANEL (CC13)
ALT: 20 U/L (ref 0–55)
ANION GAP: 7 meq/L (ref 3–11)
AST: 29 U/L (ref 5–34)
Albumin: 2.9 g/dL — ABNORMAL LOW (ref 3.5–5.0)
Alkaline Phosphatase: 63 U/L (ref 40–150)
BILIRUBIN TOTAL: 0.55 mg/dL (ref 0.20–1.20)
BUN: 14.1 mg/dL (ref 7.0–26.0)
CO2: 30 meq/L — AB (ref 22–29)
Calcium: 9.9 mg/dL (ref 8.4–10.4)
Chloride: 103 mEq/L (ref 98–109)
Creatinine: 1 mg/dL (ref 0.6–1.1)
EGFR: 59 mL/min/{1.73_m2} — ABNORMAL LOW (ref >90)
Glucose: 117 mg/dl (ref 70–140)
POTASSIUM: 4.5 meq/L (ref 3.5–5.1)
Sodium: 140 mEq/L (ref 136–145)
TOTAL PROTEIN: 6.3 g/dL — AB (ref 6.4–8.3)

## 2015-06-15 MED ORDER — SODIUM CHLORIDE 0.9 % IJ SOLN
10.0000 mL | INTRAMUSCULAR | Status: DC | PRN
  Administered 2015-06-15: 10 mL
  Filled 2015-06-15: qty 10

## 2015-06-15 MED ORDER — SODIUM CHLORIDE 0.9 % IV SOLN
Freq: Once | INTRAVENOUS | Status: AC
  Administered 2015-06-15: 13:00:00 via INTRAVENOUS
  Filled 2015-06-15: qty 4

## 2015-06-15 MED ORDER — HEPARIN SOD (PORK) LOCK FLUSH 100 UNIT/ML IV SOLN
500.0000 [IU] | Freq: Once | INTRAVENOUS | Status: AC | PRN
  Administered 2015-06-15: 500 [IU]
  Filled 2015-06-15: qty 5

## 2015-06-15 MED ORDER — PACLITAXEL PROTEIN-BOUND CHEMO INJECTION 100 MG
80.0000 mg/m2 | Freq: Once | INTRAVENOUS | Status: AC
Start: 2015-06-15 — End: 2015-06-15
  Administered 2015-06-15: 175 mg via INTRAVENOUS
  Filled 2015-06-15: qty 35

## 2015-06-15 MED ORDER — FUROSEMIDE 20 MG PO TABS: 20.0000 mg | ORAL_TABLET | Freq: Every day | ORAL | Status: DC

## 2015-06-15 MED ORDER — SODIUM CHLORIDE 0.9 % IV SOLN
Freq: Once | INTRAVENOUS | Status: AC
  Administered 2015-06-15: 13:00:00 via INTRAVENOUS

## 2015-06-15 NOTE — Progress Notes (Signed)
Patient Care Team: Midge Minium, MD as PCP - General (Family Medicine) Mackie Pai, PA-C as Physician Assistant (Physician Assistant)  DIAGNOSIS: No matching staging information was found for the patient.  SUMMARY OF ONCOLOGIC HISTORY:   Breast cancer of upper-outer quadrant of left female breast   03/02/2015 Initial Diagnosis Left breast biopsy 1:00: Invasive mammary cancer, grade 3, 1 lymph node biopsy in the axilla positive, ER 12%, PR 0%, Ki-67 100%, HER-2 negative ratio 1.53   03/08/2015 Breast MRI Left breast 1:00 position: 3.6 cm mass with enlarged axillary lymph nodes multiple levels largest 4 cm, total mass measures 10.2 cm   03/30/2015 -  Neo-Adjuvant Chemotherapy Adriamycin Cytoxan 4 followed by Abraxane weekly 12    CHIEF COMPLIANT: Abraxane week 3/12  INTERVAL HISTORY: Cassandra Maldonado is a 69 year old with above-mentioned history of left breast cancer continue adjuvant chemotherapy. She is receiving Abraxane. She was very anemic and received blood transfusion. For the past 2 weeks she's been feeling much better. She complains of leg swelling which have been bothering her. Denies any neuropathy. Denies any nausea or vomiting.  REVIEW OF SYSTEMS:   Constitutional: Denies fevers, chills or abnormal weight loss Eyes: Denies blurriness of vision Ears, nose, mouth, throat, and face: Denies mucositis or sore throat Respiratory: Denies cough, dyspnea or wheezes Cardiovascular: Denies palpitation, chest discomfort complains of lower extremity swelling Gastrointestinal:  Denies nausea, heartburn or change in bowel habits Skin: Denies abnormal skin rashes Lymphatics: Denies new lymphadenopathy or easy bruising Neurological:Denies numbness, tingling or new weaknesses Behavioral/Psych: Mood is stable, no new changes   All other systems were reviewed with the patient and are negative.  I have reviewed the past medical history, past surgical history, social history and family  history with the patient and they are unchanged from previous note.  ALLERGIES:  has No Known Allergies.  MEDICATIONS:  Current Outpatient Prescriptions  Medication Sig Dispense Refill  . acyclovir (ZOVIRAX) 400 MG tablet Take 1 tablet (400 mg total) by mouth 2 (two) times daily. 60 tablet 2  . allopurinol (ZYLOPRIM) 100 MG tablet TAKE ONE TABLET BY MOUTH TWICE DAILY 60 tablet 1  . LORazepam (ATIVAN) 0.5 MG tablet Take 1 tablet (0.5 mg total) by mouth every 6 (six) hours as needed (Nausea or vomiting). 30 tablet 0   No current facility-administered medications for this visit.    PHYSICAL EXAMINATION: ECOG PERFORMANCE STATUS: 1 - Symptomatic but completely ambulatory  Filed Vitals:   06/15/15 1112  BP: 128/73  Pulse: 79  Temp: 98.6 F (37 C)  Resp: 18   Filed Weights   06/15/15 1112  Weight: 222 lb 14.4 oz (101.107 kg)    GENERAL:alert, no distress and comfortable SKIN: skin color, texture, turgor are normal, no rashes or significant lesions EYES: normal, Conjunctiva are pink and non-injected, sclera clear OROPHARYNX:no exudate, no erythema and lips, buccal mucosa, and tongue normal  NECK: supple, thyroid normal size, non-tender, without nodularity LYMPH:  no palpable lymphadenopathy in the cervical, axillary or inguinal LUNGS: clear to auscultation and percussion with normal breathing effort HEART: regular rate & rhythm and no murmurs and no lower extremity edema ABDOMEN:abdomen soft, non-tender and normal bowel sounds Musculoskeletal:no cyanosis of digits and no clubbing  NEURO: alert & oriented x 3 with fluent speech, no focal motor/sensory deficits   LABORATORY DATA:  I have reviewed the data as listed   Chemistry      Component Value Date/Time   NA 138 06/08/2015 1105   NA 133* 04/06/2015 0820  K 4.3 06/08/2015 1105   K 5.3* 04/06/2015 0820   CL 101 04/06/2015 0820   CO2 27 06/08/2015 1105   CO2 26 04/06/2015 0820   BUN 15.6 06/08/2015 1105   BUN 38*  04/06/2015 0820   CREATININE 0.9 06/08/2015 1105   CREATININE 1.30* 04/06/2015 0820      Component Value Date/Time   CALCIUM 9.2 06/08/2015 1105   CALCIUM 9.0 04/06/2015 0820   ALKPHOS 81 06/08/2015 1105   ALKPHOS 94 04/06/2015 0820   AST 31 06/08/2015 1105   AST 17 04/06/2015 0820   ALT 27 06/08/2015 1105   ALT 24 04/06/2015 0820   BILITOT 0.58 06/08/2015 1105   BILITOT 1.0 04/06/2015 0820       Lab Results  Component Value Date   WBC 3.6* 06/15/2015   HGB 9.5* 06/15/2015   HCT 30.1* 06/15/2015   MCV 92.3 06/15/2015   PLT 250 06/15/2015   NEUTROABS 2.1 06/15/2015   ASSESSMENT & PLAN:  Breast cancer of upper-outer quadrant of left female breast Left breast invasive mammary cancer 1:00 position: 3.1 x 2.9 x 3.6 cm tumor with multiple enlarged level MCCXIII left axillary lymph nodes largest measuring 4 cm per total congruent nor mass measures 10.2 cm ER 12%, PR 0%, Ki-67 100%, HER-2 negative ratio 1.53 CT chest abdomen pelvis and bone scan 03/17/2015: No evidence of metastatic disease Treatment plan: neoadjuvant chemotherapy with dose dense Adriamycin and Cytoxan followed by Abraxane weekly 12. Current treatment: Abraxane 3/12  Chemotherapy toxicities: 1. Severe neutropenia nadir counts: Decrease the dosage of chemotherapy for cycle 2 Adriamycin and Cytoxan 2. Thrush prescribed nystatin 3. Fatigue: Combination of chemotherapy, anemia 4. Intermittent nausea 5. Severe anemia hemoglobin progressively coming down: Today's hemoglobin 7.6 we will give her 1 unit of packed red cells. 6. Dehydration: Encourage her to increase her fluid intake. We will give her 500 cc normal saline today. 6. Leg cramps: Most likely related to dehydration. Encouraged her to drink more fluids. 7. Hypotension: I asked her to discontinue lisinopril with hydrochlorothiazide.  8. Leg edema: I prescribed her Lasix 06/15/2015. I suspect this is related to hypoalbuminemia. I discussed with her extensively  about increasing her protein intake in her diet. 9. Severe anemia requiring blood transfusion related to chemotherapy area of decreased the dosage of chemotherapy to 80 mg/m from week 3 Abraxane  Return to clinic in 2 week for week 5/12 Abraxane. Patient plans to go to last [place] in August. We might have to move her appointments during that time.   No orders of the defined types were placed in this encounter.   The patient has a good understanding of the overall plan. she agrees with it. she will call with any problems that may develop before the next visit here.   Rulon Eisenmenger, MD

## 2015-06-15 NOTE — Patient Instructions (Signed)
[facility] Discharge Instructions for Patients Receiving Chemotherapy  Today you received the following chemotherapy agents: Abraxane   To help prevent nausea and vomiting after your treatment, we encourage you to take your nausea medication as directed.    If you develop nausea and vomiting that is not controlled by your nausea medication, call the clinic.   BELOW ARE SYMPTOMS THAT SHOULD BE REPORTED IMMEDIATELY:  *FEVER GREATER THAN 100.5 F  *CHILLS WITH OR WITHOUT FEVER  NAUSEA AND VOMITING THAT IS NOT CONTROLLED WITH YOUR NAUSEA MEDICATION  *UNUSUAL SHORTNESS OF BREATH  *UNUSUAL BRUISING OR BLEEDING  TENDERNESS IN MOUTH AND THROAT WITH OR WITHOUT PRESENCE OF ULCERS  *URINARY PROBLEMS  *BOWEL PROBLEMS  UNUSUAL RASH Items with * indicate a potential emergency and should be followed up as soon as possible.  Feel free to call the clinic you have any questions or concerns. The clinic phone number is [phone].  Please show the CHEMO ALERT CARD at check-in to the Emergency Department and triage nurse.   

## 2015-06-15 NOTE — Telephone Encounter (Signed)
Gave avs & calendar for July °

## 2015-06-16 ENCOUNTER — Other Ambulatory Visit: Payer: Self-pay | Admitting: Hematology and Oncology

## 2015-06-16 DIAGNOSIS — C50412 Malignant neoplasm of upper-outer quadrant of left female breast: Secondary | ICD-10-CM

## 2015-06-22 ENCOUNTER — Ambulatory Visit (HOSPITAL_BASED_OUTPATIENT_CLINIC_OR_DEPARTMENT_OTHER): Payer: Medicare Other

## 2015-06-22 ENCOUNTER — Other Ambulatory Visit (HOSPITAL_BASED_OUTPATIENT_CLINIC_OR_DEPARTMENT_OTHER): Payer: Medicare Other

## 2015-06-22 VITALS — BP 125/70 | HR 79 | Temp 98.0°F

## 2015-06-22 DIAGNOSIS — Z5111 Encounter for antineoplastic chemotherapy: Secondary | ICD-10-CM

## 2015-06-22 DIAGNOSIS — C50412 Malignant neoplasm of upper-outer quadrant of left female breast: Secondary | ICD-10-CM

## 2015-06-22 LAB — CBC WITH DIFFERENTIAL/PLATELET
BASO%: 1.8 % (ref 0.0–2.0)
Basophils Absolute: 0.1 10*3/uL (ref 0.0–0.1)
EOS ABS: 0.1 10*3/uL (ref 0.0–0.5)
EOS%: 2.2 % (ref 0.0–7.0)
HEMATOCRIT: 28.8 % — AB (ref 34.8–46.6)
HEMOGLOBIN: 9.5 g/dL — AB (ref 11.6–15.9)
LYMPH#: 1.1 10*3/uL (ref 0.9–3.3)
LYMPH%: 27 % (ref 14.0–49.7)
MCH: 30.2 pg (ref 25.1–34.0)
MCHC: 32.9 g/dL (ref 31.5–36.0)
MCV: 91.6 fL (ref 79.5–101.0)
MONO#: 0.5 10*3/uL (ref 0.1–0.9)
MONO%: 12.3 % (ref 0.0–14.0)
NEUT#: 2.3 10*3/uL (ref 1.5–6.5)
NEUT%: 56.7 % (ref 38.4–76.8)
Platelets: 261 10*3/uL (ref 145–400)
RBC: 3.14 10*6/uL — ABNORMAL LOW (ref 3.70–5.45)
RDW: 18.3 % — AB (ref 11.2–14.5)
WBC: 4 10*3/uL (ref 3.9–10.3)

## 2015-06-22 LAB — COMPREHENSIVE METABOLIC PANEL (CC13)
ALBUMIN: 3.2 g/dL — AB (ref 3.5–5.0)
ALT: 22 U/L (ref 0–55)
ANION GAP: 10 meq/L (ref 3–11)
AST: 28 U/L (ref 5–34)
Alkaline Phosphatase: 67 U/L (ref 40–150)
BILIRUBIN TOTAL: 0.81 mg/dL (ref 0.20–1.20)
BUN: 21.3 mg/dL (ref 7.0–26.0)
CALCIUM: 9.7 mg/dL (ref 8.4–10.4)
CHLORIDE: 103 meq/L (ref 98–109)
CO2: 28 mEq/L (ref 22–29)
Creatinine: 1.1 mg/dL (ref 0.6–1.1)
EGFR: 51 mL/min/{1.73_m2} — AB (ref >90)
Glucose: 115 mg/dl (ref 70–140)
Potassium: 4 mEq/L (ref 3.5–5.1)
SODIUM: 141 meq/L (ref 136–145)
Total Protein: 6.3 g/dL — ABNORMAL LOW (ref 6.4–8.3)

## 2015-06-22 MED ORDER — PACLITAXEL PROTEIN-BOUND CHEMO INJECTION 100 MG
80.0000 mg/m2 | Freq: Once | INTRAVENOUS | Status: AC
  Administered 2015-06-22: 175 mg via INTRAVENOUS
  Filled 2015-06-22: qty 35

## 2015-06-22 MED ORDER — HEPARIN SOD (PORK) LOCK FLUSH 100 UNIT/ML IV SOLN
500.0000 [IU] | Freq: Once | INTRAVENOUS | Status: AC | PRN
  Administered 2015-06-22: 500 [IU]
  Filled 2015-06-22: qty 5

## 2015-06-22 MED ORDER — SODIUM CHLORIDE 0.9 % IV SOLN
Freq: Once | INTRAVENOUS | Status: AC
  Administered 2015-06-22: 09:00:00 via INTRAVENOUS
  Filled 2015-06-22: qty 4

## 2015-06-22 MED ORDER — SODIUM CHLORIDE 0.9 % IV SOLN
Freq: Once | INTRAVENOUS | Status: AC
  Administered 2015-06-22: 09:00:00 via INTRAVENOUS

## 2015-06-22 MED ORDER — SODIUM CHLORIDE 0.9 % IJ SOLN
10.0000 mL | INTRAMUSCULAR | Status: DC | PRN
  Administered 2015-06-22: 10 mL
  Filled 2015-06-22: qty 10

## 2015-06-22 NOTE — Patient Instructions (Signed)
[facility] Discharge Instructions for Patients Receiving Chemotherapy  Today you received the following chemotherapy agents: Abraxane   To help prevent nausea and vomiting after your treatment, we encourage you to take your nausea medication as directed.    If you develop nausea and vomiting that is not controlled by your nausea medication, call the clinic.   BELOW ARE SYMPTOMS THAT SHOULD BE REPORTED IMMEDIATELY:  *FEVER GREATER THAN 100.5 F  *CHILLS WITH OR WITHOUT FEVER  NAUSEA AND VOMITING THAT IS NOT CONTROLLED WITH YOUR NAUSEA MEDICATION  *UNUSUAL SHORTNESS OF BREATH  *UNUSUAL BRUISING OR BLEEDING  TENDERNESS IN MOUTH AND THROAT WITH OR WITHOUT PRESENCE OF ULCERS  *URINARY PROBLEMS  *BOWEL PROBLEMS  UNUSUAL RASH Items with * indicate a potential emergency and should be followed up as soon as possible.  Feel free to call the clinic you have any questions or concerns. The clinic phone number is [phone].  Please show the CHEMO ALERT CARD at check-in to the Emergency Department and triage nurse.   

## 2015-06-28 NOTE — Assessment & Plan Note (Signed)
Left breast invasive mammary cancer 1:00 position: 3.1 x 2.9 x 3.6 cm tumor with multiple enlarged level MCCXIII left axillary lymph nodes largest measuring 4 cm per total congruent nor mass measures 10.2 cm ER 12%, PR 0%, Ki-67 100%, HER-2 negative ratio 1.53 CT chest abdomen pelvis and bone scan 03/17/2015: No evidence of metastatic disease Treatment plan: neoadjuvant chemotherapy with dose dense Adriamycin and Cytoxan followed by Abraxane weekly 12. Current treatment: Abraxane 5/12  Chemotherapy toxicities: 1. Severe neutropenia nadir counts: Decrease the dosage of chemotherapy for cycle 2 Adriamycin and Cytoxan 2. Thrush prescribed nystatin 3. Fatigue: Combination of chemotherapy, anemia 4. Intermittent nausea 5. Severe anemia hemoglobin progressively coming down: Today's hemoglobin 7.6 we will give her 1 unit of packed red cells. 6. Dehydration: Encourage her to increase her fluid intake. We will give her 500 cc normal saline today. 6. Leg cramps: Most likely related to dehydration. Encouraged her to drink more fluids. 7. Hypotension: I asked her to discontinue lisinopril with hydrochlorothiazide. Once her blood pressure comes up we can resume her antihypertensive medication.  Return to clinic in 2 week for week 7/12 Abraxane

## 2015-06-29 ENCOUNTER — Ambulatory Visit (HOSPITAL_BASED_OUTPATIENT_CLINIC_OR_DEPARTMENT_OTHER): Payer: Medicare Other | Admitting: Hematology and Oncology

## 2015-06-29 ENCOUNTER — Other Ambulatory Visit (HOSPITAL_BASED_OUTPATIENT_CLINIC_OR_DEPARTMENT_OTHER): Payer: Medicare Other

## 2015-06-29 ENCOUNTER — Encounter: Payer: Self-pay | Admitting: Hematology and Oncology

## 2015-06-29 ENCOUNTER — Ambulatory Visit (HOSPITAL_BASED_OUTPATIENT_CLINIC_OR_DEPARTMENT_OTHER): Payer: Medicare Other

## 2015-06-29 ENCOUNTER — Telehealth: Payer: Self-pay | Admitting: Hematology and Oncology

## 2015-06-29 VITALS — BP 118/65 | HR 81 | Temp 98.3°F | Resp 18 | Ht 65.0 in | Wt 221.6 lb

## 2015-06-29 DIAGNOSIS — C773 Secondary and unspecified malignant neoplasm of axilla and upper limb lymph nodes: Secondary | ICD-10-CM

## 2015-06-29 DIAGNOSIS — Z5111 Encounter for antineoplastic chemotherapy: Secondary | ICD-10-CM | POA: Diagnosis not present

## 2015-06-29 DIAGNOSIS — C50412 Malignant neoplasm of upper-outer quadrant of left female breast: Secondary | ICD-10-CM | POA: Diagnosis not present

## 2015-06-29 DIAGNOSIS — D6481 Anemia due to antineoplastic chemotherapy: Secondary | ICD-10-CM

## 2015-06-29 LAB — CBC WITH DIFFERENTIAL/PLATELET
BASO%: 1.2 % (ref 0.0–2.0)
Basophils Absolute: 0.1 10*3/uL (ref 0.0–0.1)
EOS%: 0.9 % (ref 0.0–7.0)
Eosinophils Absolute: 0 10*3/uL (ref 0.0–0.5)
HEMATOCRIT: 28.1 % — AB (ref 34.8–46.6)
HGB: 9.1 g/dL — ABNORMAL LOW (ref 11.6–15.9)
LYMPH%: 29.6 % (ref 14.0–49.7)
MCH: 30.4 pg (ref 25.1–34.0)
MCHC: 32.4 g/dL (ref 31.5–36.0)
MCV: 94 fL (ref 79.5–101.0)
MONO#: 0.4 10*3/uL (ref 0.1–0.9)
MONO%: 9.7 % (ref 0.0–14.0)
NEUT%: 58.6 % (ref 38.4–76.8)
NEUTROS ABS: 2.5 10*3/uL (ref 1.5–6.5)
PLATELETS: 238 10*3/uL (ref 145–400)
RBC: 2.99 10*6/uL — AB (ref 3.70–5.45)
RDW: 17.8 % — ABNORMAL HIGH (ref 11.2–14.5)
WBC: 4.3 10*3/uL (ref 3.9–10.3)
lymph#: 1.3 10*3/uL (ref 0.9–3.3)

## 2015-06-29 LAB — COMPREHENSIVE METABOLIC PANEL (CC13)
ALT: 25 U/L (ref 0–55)
AST: 31 U/L (ref 5–34)
Albumin: 3.3 g/dL — ABNORMAL LOW (ref 3.5–5.0)
Alkaline Phosphatase: 86 U/L (ref 40–150)
Anion Gap: 11 mEq/L (ref 3–11)
BUN: 13.4 mg/dL (ref 7.0–26.0)
CHLORIDE: 102 meq/L (ref 98–109)
CO2: 27 meq/L (ref 22–29)
Calcium: 9.8 mg/dL (ref 8.4–10.4)
Creatinine: 1.1 mg/dL (ref 0.6–1.1)
EGFR: 52 mL/min/{1.73_m2} — ABNORMAL LOW (ref >90)
GLUCOSE: 122 mg/dL (ref 70–140)
POTASSIUM: 3.5 meq/L (ref 3.5–5.1)
SODIUM: 140 meq/L (ref 136–145)
TOTAL PROTEIN: 6.7 g/dL (ref 6.4–8.3)
Total Bilirubin: 0.95 mg/dL (ref 0.20–1.20)

## 2015-06-29 MED ORDER — SODIUM CHLORIDE 0.9 % IV SOLN
Freq: Once | INTRAVENOUS | Status: AC
  Administered 2015-06-29: 09:00:00 via INTRAVENOUS

## 2015-06-29 MED ORDER — SODIUM CHLORIDE 0.9 % IJ SOLN
10.0000 mL | INTRAMUSCULAR | Status: DC | PRN
  Administered 2015-06-29: 10 mL
  Filled 2015-06-29: qty 10

## 2015-06-29 MED ORDER — HEPARIN SOD (PORK) LOCK FLUSH 100 UNIT/ML IV SOLN
500.0000 [IU] | Freq: Once | INTRAVENOUS | Status: AC | PRN
  Administered 2015-06-29: 500 [IU]
  Filled 2015-06-29: qty 5

## 2015-06-29 MED ORDER — ONDANSETRON HCL 40 MG/20ML IJ SOLN
Freq: Once | INTRAMUSCULAR | Status: AC
  Administered 2015-06-29: 10:00:00 via INTRAVENOUS
  Filled 2015-06-29: qty 4

## 2015-06-29 MED ORDER — PACLITAXEL PROTEIN-BOUND CHEMO INJECTION 100 MG
80.0000 mg/m2 | Freq: Once | INTRAVENOUS | Status: AC
  Administered 2015-06-29: 175 mg via INTRAVENOUS
  Filled 2015-06-29: qty 35

## 2015-06-29 NOTE — Telephone Encounter (Signed)
Appointments made and avs will be printed in chemo and patient states will get them on mychart as well

## 2015-06-29 NOTE — Patient Instructions (Signed)
[facility] Discharge Instructions for Patients Receiving Chemotherapy  Today you received the following chemotherapy agents: Abraxane   To help prevent nausea and vomiting after your treatment, we encourage you to take your nausea medication as directed.    If you develop nausea and vomiting that is not controlled by your nausea medication, call the clinic.   BELOW ARE SYMPTOMS THAT SHOULD BE REPORTED IMMEDIATELY:  *FEVER GREATER THAN 100.5 F  *CHILLS WITH OR WITHOUT FEVER  NAUSEA AND VOMITING THAT IS NOT CONTROLLED WITH YOUR NAUSEA MEDICATION  *UNUSUAL SHORTNESS OF BREATH  *UNUSUAL BRUISING OR BLEEDING  TENDERNESS IN MOUTH AND THROAT WITH OR WITHOUT PRESENCE OF ULCERS  *URINARY PROBLEMS  *BOWEL PROBLEMS  UNUSUAL RASH Items with * indicate a potential emergency and should be followed up as soon as possible.  Feel free to call the clinic you have any questions or concerns. The clinic phone number is [phone].  Please show the CHEMO ALERT CARD at check-in to the Emergency Department and triage nurse.   

## 2015-06-29 NOTE — Progress Notes (Signed)
Patient Care Team: Midge Minium, MD as PCP - General (Family Medicine) Mackie Pai, PA-C as Physician Assistant (Physician Assistant)  DIAGNOSIS: No matching staging information was found for the patient.  SUMMARY OF ONCOLOGIC HISTORY:   Breast cancer of upper-outer quadrant of left female breast   03/02/2015 Initial Diagnosis Left breast biopsy 1:00: Invasive mammary cancer, grade 3, 1 lymph node biopsy in the axilla positive, ER 12%, PR 0%, Ki-67 100%, HER-2 negative ratio 1.53   03/08/2015 Breast MRI Left breast 1:00 position: 3.6 cm mass with enlarged axillary lymph nodes multiple levels largest 4 cm, total mass measures 10.2 cm   03/30/2015 -  Neo-Adjuvant Chemotherapy Adriamycin Cytoxan 4 followed by Abraxane weekly 12    CHIEF COMPLIANT: Abraxane 5/12  INTERVAL HISTORY: Cassandra Maldonado is a 69-year-old with above-mentioned history of left breast cancer currently on adjuvant chemotherapy with Abraxane. She is tolerating it fairly well has very mild nausea for which he takes antiemetics. She has noticed that her thumb nail is slightly swollen at the base. Also some bruising is noted across some of the other nails.  REVIEW OF SYSTEMS:   Constitutional: Denies fevers, chills or abnormal weight loss Eyes: Denies blurriness of vision Ears, nose, mouth, throat, and face: Denies mucositis or sore throat Respiratory: Denies cough, dyspnea or wheezes Cardiovascular: Denies palpitation, chest discomfort or lower extremity swelling Gastrointestinal: nausea intermittently Skin: Denies abnormal skin rashes Lymphatics: Denies new lymphadenopathy or easy bruising Neurological:Denies numbness, tingling or new weaknesses Behavioral/Psych: Mood is stable, no new changes  All other systems were reviewed with the patient and are negative.  I have reviewed the past medical history, past surgical history, social history and family history with the patient and they are unchanged from previous  note.  ALLERGIES:  has No Known Allergies.  MEDICATIONS:  Current Outpatient Prescriptions  Medication Sig Dispense Refill  . acyclovir (ZOVIRAX) 400 MG tablet Take 1 tablet (400 mg total) by mouth 2 (two) times daily. 60 tablet 2  . allopurinol (ZYLOPRIM) 100 MG tablet TAKE ONE TABLET BY MOUTH TWICE DAILY 60 tablet 1  . furosemide (LASIX) 20 MG tablet Take 1 tablet (20 mg total) by mouth daily. 30 tablet 1  . LORazepam (ATIVAN) 0.5 MG tablet Take 1 tablet (0.5 mg total) by mouth every 6 (six) hours as needed (Nausea or vomiting). 30 tablet 0  . ondansetron (ZOFRAN) 8 MG tablet TAKE ONE TABLET BY MOUTH TWICE DAILY AS NEEDED START  ON  THE  THIRD  DAY  AFTER  CHEMOTHERAPY 30 tablet 0  . prochlorperazine (COMPAZINE) 10 MG tablet TAKE ONE TABLET BY MOUTH EVERY 6 HOURS AS NEEDED FOR NAUSEA AND VOMITING 30 tablet 0   No current facility-administered medications for this visit.    PHYSICAL EXAMINATION: ECOG PERFORMANCE STATUS: 1 - Symptomatic but completely ambulatory  Filed Vitals:   06/29/15 0831  BP: 118/65  Pulse: 81  Temp: 98.3 F (36.8 C)  Resp: 18   Filed Weights   06/29/15 0831  Weight: 221 lb 9.6 oz (100.517 kg)    GENERAL:alert, no distress and comfortable SKIN: skin color, texture, turgor are normal, no rashes or significant lesions EYES: normal, Conjunctiva are pink and non-injected, sclera clear OROPHARYNX:no exudate, no erythema and lips, buccal mucosa, and tongue normal  NECK: supple, thyroid normal size, non-tender, without nodularity LYMPH:  no palpable lymphadenopathy in the cervical, axillary or inguinal LUNGS: clear to auscultation and percussion with normal breathing effort HEART: regular rate & rhythm and no murmurs and  no lower extremity edema ABDOMEN:abdomen soft, non-tender and normal bowel sounds Musculoskeletal:no cyanosis of digits and no clubbing  NEURO: alert & oriented x 3 with fluent speech, no focal motor/sensory deficits   LABORATORY DATA:  I  have reviewed the data as listed   Chemistry      Component Value Date/Time   NA 141 06/22/2015 0807   NA 133* 04/06/2015 0820   K 4.0 06/22/2015 0807   K 5.3* 04/06/2015 0820   CL 101 04/06/2015 0820   CO2 28 06/22/2015 0807   CO2 26 04/06/2015 0820   BUN 21.3 06/22/2015 0807   BUN 38* 04/06/2015 0820   CREATININE 1.1 06/22/2015 0807   CREATININE 1.30* 04/06/2015 0820      Component Value Date/Time   CALCIUM 9.7 06/22/2015 0807   CALCIUM 9.0 04/06/2015 0820   ALKPHOS 67 06/22/2015 0807   ALKPHOS 94 04/06/2015 0820   AST 28 06/22/2015 0807   AST 17 04/06/2015 0820   ALT 22 06/22/2015 0807   ALT 24 04/06/2015 0820   BILITOT 0.81 06/22/2015 0807   BILITOT 1.0 04/06/2015 0820       Lab Results  Component Value Date   WBC 4.3 06/29/2015   HGB 9.1* 06/29/2015   HCT 28.1* 06/29/2015   MCV 94.0 06/29/2015   PLT 238 06/29/2015   NEUTROABS 2.5 06/29/2015   ASSESSMENT & PLAN:  Breast cancer of upper-outer quadrant of left female breast Left breast invasive mammary cancer 1:00 position: 3.1 x 2.9 x 3.6 cm tumor with multiple enlarged level MCCXIII left axillary lymph nodes largest measuring 4 cm per total congruent nor mass measures 10.2 cm ER 12%, PR 0%, Ki-67 100%, HER-2 negative ratio 1.53 CT chest abdomen pelvis and bone scan 03/17/2015: No evidence of metastatic disease Treatment plan: neoadjuvant chemotherapy with dose dense Adriamycin and Cytoxan followed by Abraxane weekly 12. Current treatment: Abraxane 5/12  Chemotherapy toxicities: 1. Severe neutropenia nadir counts: Decrease the dosage of chemotherapy for cycle 2 Adriamycin and Cytoxan 2. Thrush prescribed nystatin 3. Fatigue: Combination of chemotherapy, anemia 4. Intermittent nausea 5. Severe anemia 1 unit of packed red cells given. Today's hemoglobin 9.1 6. Dehydration: Encourage her to increase her fluid intake. We will give her 500 cc normal saline today. 6. Leg cramps: Most likely related to dehydration.  Encouraged her to drink more fluids. 7. Hypotension: I asked her to discontinue lisinopril with hydrochlorothiazide. Once her blood pressure comes up we can resume her antihypertensive medication. 8. 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  No orders of the defined types were placed in this encounter.   The patient has a good understanding of the overall plan. she agrees with it. she will call with any problems that may develop before the next visit here.   Rulon Eisenmenger, MD

## 2015-07-06 ENCOUNTER — Ambulatory Visit (HOSPITAL_BASED_OUTPATIENT_CLINIC_OR_DEPARTMENT_OTHER): Payer: Medicare Other

## 2015-07-06 ENCOUNTER — Other Ambulatory Visit (HOSPITAL_BASED_OUTPATIENT_CLINIC_OR_DEPARTMENT_OTHER): Payer: Medicare Other

## 2015-07-06 ENCOUNTER — Encounter: Payer: Self-pay | Admitting: *Deleted

## 2015-07-06 VITALS — BP 142/7 | HR 79 | Temp 98.1°F | Resp 16

## 2015-07-06 DIAGNOSIS — Z5111 Encounter for antineoplastic chemotherapy: Secondary | ICD-10-CM | POA: Diagnosis not present

## 2015-07-06 DIAGNOSIS — C773 Secondary and unspecified malignant neoplasm of axilla and upper limb lymph nodes: Secondary | ICD-10-CM

## 2015-07-06 DIAGNOSIS — C50412 Malignant neoplasm of upper-outer quadrant of left female breast: Secondary | ICD-10-CM

## 2015-07-06 LAB — CBC WITH DIFFERENTIAL/PLATELET
BASO%: 1.9 % (ref 0.0–2.0)
BASOS ABS: 0.1 10*3/uL (ref 0.0–0.1)
EOS%: 1.3 % (ref 0.0–7.0)
Eosinophils Absolute: 0.1 10*3/uL (ref 0.0–0.5)
HEMATOCRIT: 26.8 % — AB (ref 34.8–46.6)
HGB: 9 g/dL — ABNORMAL LOW (ref 11.6–15.9)
LYMPH%: 30.7 % (ref 14.0–49.7)
MCH: 31.3 pg (ref 25.1–34.0)
MCHC: 33.6 g/dL (ref 31.5–36.0)
MCV: 93.3 fL (ref 79.5–101.0)
MONO#: 0.4 10*3/uL (ref 0.1–0.9)
MONO%: 9.4 % (ref 0.0–14.0)
NEUT#: 2.3 10*3/uL (ref 1.5–6.5)
NEUT%: 56.7 % (ref 38.4–76.8)
PLATELETS: 281 10*3/uL (ref 145–400)
RBC: 2.88 10*6/uL — ABNORMAL LOW (ref 3.70–5.45)
RDW: 18.6 % — ABNORMAL HIGH (ref 11.2–14.5)
WBC: 4 10*3/uL (ref 3.9–10.3)
lymph#: 1.2 10*3/uL (ref 0.9–3.3)

## 2015-07-06 LAB — COMPREHENSIVE METABOLIC PANEL (CC13)
ALK PHOS: 77 U/L (ref 40–150)
ALT: 18 U/L (ref 0–55)
ANION GAP: 8 meq/L (ref 3–11)
AST: 27 U/L (ref 5–34)
Albumin: 3.4 g/dL — ABNORMAL LOW (ref 3.5–5.0)
BILIRUBIN TOTAL: 0.96 mg/dL (ref 0.20–1.20)
BUN: 11.3 mg/dL (ref 7.0–26.0)
CALCIUM: 9.9 mg/dL (ref 8.4–10.4)
CHLORIDE: 103 meq/L (ref 98–109)
CO2: 30 mEq/L — ABNORMAL HIGH (ref 22–29)
CREATININE: 1 mg/dL (ref 0.6–1.1)
EGFR: 55 mL/min/{1.73_m2} — AB (ref >90)
Glucose: 124 mg/dl (ref 70–140)
Potassium: 4 mEq/L (ref 3.5–5.1)
SODIUM: 141 meq/L (ref 136–145)
Total Protein: 6.5 g/dL (ref 6.4–8.3)

## 2015-07-06 MED ORDER — SODIUM CHLORIDE 0.9 % IV SOLN
Freq: Once | INTRAVENOUS | Status: AC
  Administered 2015-07-06: 09:00:00 via INTRAVENOUS
  Filled 2015-07-06: qty 4

## 2015-07-06 MED ORDER — SODIUM CHLORIDE 0.9 % IV SOLN
Freq: Once | INTRAVENOUS | Status: AC
  Administered 2015-07-06: 09:00:00 via INTRAVENOUS

## 2015-07-06 MED ORDER — HEPARIN SOD (PORK) LOCK FLUSH 100 UNIT/ML IV SOLN
500.0000 [IU] | Freq: Once | INTRAVENOUS | Status: AC | PRN
  Administered 2015-07-06: 500 [IU]
  Filled 2015-07-06: qty 5

## 2015-07-06 MED ORDER — PACLITAXEL PROTEIN-BOUND CHEMO INJECTION 100 MG
80.0000 mg/m2 | Freq: Once | INTRAVENOUS | Status: AC
  Administered 2015-07-06: 175 mg via INTRAVENOUS
  Filled 2015-07-06: qty 35

## 2015-07-06 MED ORDER — SODIUM CHLORIDE 0.9 % IJ SOLN
10.0000 mL | INTRAMUSCULAR | Status: DC | PRN
  Administered 2015-07-06: 10 mL
  Filled 2015-07-06: qty 10

## 2015-07-06 NOTE — Patient Instructions (Signed)
[facility] Discharge Instructions for Patients Receiving Chemotherapy  Today you received the following chemotherapy agents: Abraxane   To help prevent nausea and vomiting after your treatment, we encourage you to take your nausea medication as directed.    If you develop nausea and vomiting that is not controlled by your nausea medication, call the clinic.   BELOW ARE SYMPTOMS THAT SHOULD BE REPORTED IMMEDIATELY:  *FEVER GREATER THAN 100.5 F  *CHILLS WITH OR WITHOUT FEVER  NAUSEA AND VOMITING THAT IS NOT CONTROLLED WITH YOUR NAUSEA MEDICATION  *UNUSUAL SHORTNESS OF BREATH  *UNUSUAL BRUISING OR BLEEDING  TENDERNESS IN MOUTH AND THROAT WITH OR WITHOUT PRESENCE OF ULCERS  *URINARY PROBLEMS  *BOWEL PROBLEMS  UNUSUAL RASH Items with * indicate a potential emergency and should be followed up as soon as possible.  Feel free to call the clinic you have any questions or concerns. The clinic phone number is [phone].  Please show the CHEMO ALERT CARD at check-in to the Emergency Department and triage nurse.   

## 2015-07-12 NOTE — Assessment & Plan Note (Signed)
Left breast invasive mammary cancer 1:00 position: 3.1 x 2.9 x 3.6 cm tumor with multiple enlarged level MCCXIII left axillary lymph nodes largest measuring 4 cm per total congruent nor mass measures 10.2 cm ER 12%, PR 0%, Ki-67 100%, HER-2 negative ratio 1.53 CT chest abdomen pelvis and bone scan 03/17/2015: No evidence of metastatic disease Treatment plan: neoadjuvant chemotherapy with dose dense Adriamycin and Cytoxan followed by Abraxane weekly 12. Current treatment: Abraxane 7/12  Chemotherapy toxicities: 1. Severe neutropenia nadir counts: Decrease the dosage of chemotherapy for cycle 2 Adriamycin and Cytoxan 2. Thrush prescribed nystatin 3. Fatigue: Combination of chemotherapy, anemia 4. Intermittent nausea 5. Severe anemia 1 unit of packed red cells given. Today's hemoglobin 9.1 6. Dehydration: Encourage her to increase her fluid intake. We will give her 500 cc normal saline today. 6. Leg cramps: Most likely related to dehydration. Encouraged her to drink more fluids. 7. Hypotension: I asked her to discontinue lisinopril with hydrochlorothiazide. Once her blood pressure comes up we can resume her antihypertensive medication. 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  Return to clinic in 2 week for week 9/12 Abraxane Patient plans to go to [facility] for [employer] and is very excited about it.

## 2015-07-13 ENCOUNTER — Ambulatory Visit: Payer: Medicare Other

## 2015-07-13 ENCOUNTER — Ambulatory Visit (HOSPITAL_BASED_OUTPATIENT_CLINIC_OR_DEPARTMENT_OTHER): Payer: Medicare Other | Admitting: Hematology and Oncology

## 2015-07-13 ENCOUNTER — Ambulatory Visit (HOSPITAL_BASED_OUTPATIENT_CLINIC_OR_DEPARTMENT_OTHER): Payer: Medicare Other

## 2015-07-13 ENCOUNTER — Other Ambulatory Visit (HOSPITAL_BASED_OUTPATIENT_CLINIC_OR_DEPARTMENT_OTHER): Payer: Medicare Other

## 2015-07-13 ENCOUNTER — Encounter: Payer: Self-pay | Admitting: *Deleted

## 2015-07-13 ENCOUNTER — Telehealth: Payer: Self-pay | Admitting: Hematology and Oncology

## 2015-07-13 ENCOUNTER — Encounter: Payer: Self-pay | Admitting: Hematology and Oncology

## 2015-07-13 VITALS — BP 137/69 | HR 72 | Temp 98.7°F | Ht 65.0 in | Wt 219.7 lb

## 2015-07-13 DIAGNOSIS — D6481 Anemia due to antineoplastic chemotherapy: Secondary | ICD-10-CM | POA: Diagnosis not present

## 2015-07-13 DIAGNOSIS — G629 Polyneuropathy, unspecified: Secondary | ICD-10-CM

## 2015-07-13 DIAGNOSIS — Z5111 Encounter for antineoplastic chemotherapy: Secondary | ICD-10-CM

## 2015-07-13 DIAGNOSIS — C50412 Malignant neoplasm of upper-outer quadrant of left female breast: Secondary | ICD-10-CM

## 2015-07-13 DIAGNOSIS — C773 Secondary and unspecified malignant neoplasm of axilla and upper limb lymph nodes: Secondary | ICD-10-CM

## 2015-07-13 DIAGNOSIS — R110 Nausea: Secondary | ICD-10-CM

## 2015-07-13 LAB — COMPREHENSIVE METABOLIC PANEL (CC13)
ALBUMIN: 3.4 g/dL — AB (ref 3.5–5.0)
ALT: 17 U/L (ref 0–55)
AST: 27 U/L (ref 5–34)
Alkaline Phosphatase: 76 U/L (ref 40–150)
Anion Gap: 9 mEq/L (ref 3–11)
BUN: 12.8 mg/dL (ref 7.0–26.0)
CALCIUM: 9.4 mg/dL (ref 8.4–10.4)
CHLORIDE: 102 meq/L (ref 98–109)
CO2: 30 mEq/L — ABNORMAL HIGH (ref 22–29)
Creatinine: 1.1 mg/dL (ref 0.6–1.1)
EGFR: 53 mL/min/{1.73_m2} — ABNORMAL LOW (ref >90)
GLUCOSE: 131 mg/dL (ref 70–140)
Potassium: 3.2 mEq/L — ABNORMAL LOW (ref 3.5–5.1)
SODIUM: 141 meq/L (ref 136–145)
Total Bilirubin: 0.91 mg/dL (ref 0.20–1.20)
Total Protein: 6.5 g/dL (ref 6.4–8.3)

## 2015-07-13 LAB — CBC WITH DIFFERENTIAL/PLATELET
BASO%: 2 % (ref 0.0–2.0)
Basophils Absolute: 0.1 10*3/uL (ref 0.0–0.1)
EOS%: 1.6 % (ref 0.0–7.0)
Eosinophils Absolute: 0 10*3/uL (ref 0.0–0.5)
HCT: 25.9 % — ABNORMAL LOW (ref 34.8–46.6)
HGB: 8.8 g/dL — ABNORMAL LOW (ref 11.6–15.9)
LYMPH#: 1 10*3/uL (ref 0.9–3.3)
LYMPH%: 30.4 % (ref 14.0–49.7)
MCH: 31.7 pg (ref 25.1–34.0)
MCHC: 33.9 g/dL (ref 31.5–36.0)
MCV: 93.5 fL (ref 79.5–101.0)
MONO#: 0.3 10*3/uL (ref 0.1–0.9)
MONO%: 8.6 % (ref 0.0–14.0)
NEUT#: 1.8 10*3/uL (ref 1.5–6.5)
NEUT%: 57.4 % (ref 38.4–76.8)
Platelets: 294 10*3/uL (ref 145–400)
RBC: 2.78 10*6/uL — ABNORMAL LOW (ref 3.70–5.45)
RDW: 18.3 % — AB (ref 11.2–14.5)
WBC: 3.2 10*3/uL — ABNORMAL LOW (ref 3.9–10.3)

## 2015-07-13 MED ORDER — SODIUM CHLORIDE 0.9 % IJ SOLN
10.0000 mL | INTRAMUSCULAR | Status: DC | PRN
  Administered 2015-07-13: 10 mL
  Filled 2015-07-13: qty 10

## 2015-07-13 MED ORDER — SODIUM CHLORIDE 0.9 % IV SOLN
Freq: Once | INTRAVENOUS | Status: AC
  Administered 2015-07-13: 10:00:00 via INTRAVENOUS
  Filled 2015-07-13: qty 4

## 2015-07-13 MED ORDER — HEPARIN SOD (PORK) LOCK FLUSH 100 UNIT/ML IV SOLN
500.0000 [IU] | Freq: Once | INTRAVENOUS | Status: AC | PRN
  Administered 2015-07-13: 500 [IU]
  Filled 2015-07-13: qty 5

## 2015-07-13 MED ORDER — PACLITAXEL PROTEIN-BOUND CHEMO INJECTION 100 MG
70.0000 mg/m2 | Freq: Once | INTRAVENOUS | Status: AC
  Administered 2015-07-13: 150 mg via INTRAVENOUS
  Filled 2015-07-13: qty 30

## 2015-07-13 MED ORDER — SODIUM CHLORIDE 0.9 % IV SOLN
Freq: Once | INTRAVENOUS | Status: AC
  Administered 2015-07-13: 10:00:00 via INTRAVENOUS

## 2015-07-13 NOTE — Telephone Encounter (Signed)
Gave avs & calendar for August °

## 2015-07-13 NOTE — Progress Notes (Signed)
Patient Care Team: Midge Minium, MD as PCP - General (Family Medicine) Mackie Pai, PA-C as Physician Assistant (Physician Assistant)  DIAGNOSIS: No matching staging information was found for the patient.  SUMMARY OF ONCOLOGIC HISTORY:   Breast cancer of upper-outer quadrant of left female breast   03/02/2015 Initial Diagnosis Left breast biopsy 1:00: Invasive mammary cancer, grade 3, 1 lymph node biopsy in the axilla positive, ER 12%, PR 0%, Ki-67 100%, HER-2 negative ratio 1.53   03/08/2015 Breast MRI Left breast 1:00 position: 3.6 cm mass with enlarged axillary lymph nodes multiple levels largest 4 cm, total mass measures 10.2 cm   03/30/2015 -  Neo-Adjuvant Chemotherapy Adriamycin Cytoxan 4 followed by Abraxane weekly 12    CHIEF COMPLIANT: cycle 7/12 Abraxane  INTERVAL HISTORY: Cassandra Maldonado is a 69 year old with above-mentioned history of left breast cancer currently on new adjuvant chemotherapy with Abraxane. She is tolerating it fairly well. Occasionally she takes a nausea pill. Denies any diarrhea or constipation. Neuropathy is primarily present in the feet but it is grade 1. She feels somewhat tired than usual.  REVIEW OF SYSTEMS:   Constitutional: Denies fevers, chills or abnormal weight loss Eyes: Denies blurriness of vision Ears, nose, mouth, throat, and face: Denies mucositis or sore throat Respiratory: Denies cough, dyspnea or wheezes Cardiovascular: Denies palpitation, chest discomfort or lower extremity swelling Gastrointestinal:  Denies nausea, heartburn or change in bowel habits Skin: Denies abnormal skin rashes Lymphatics: Denies new lymphadenopathy or easy bruising Neurological:neuropathy in the feet Behavioral/Psych: Mood is stable, no new changes  Breast: intermittent left chest wall sharp discomfort. All other systems were reviewed with the patient and are negative.  I have reviewed the past medical history, past surgical history, social history and  family history with the patient and they are unchanged from previous note.  ALLERGIES:  has No Known Allergies.  MEDICATIONS:  Current Outpatient Prescriptions  Medication Sig Dispense Refill  . acyclovir (ZOVIRAX) 400 MG tablet Take 1 tablet (400 mg total) by mouth 2 (two) times daily. 60 tablet 2  . allopurinol (ZYLOPRIM) 100 MG tablet TAKE ONE TABLET BY MOUTH TWICE DAILY 60 tablet 1  . furosemide (LASIX) 20 MG tablet Take 1 tablet (20 mg total) by mouth daily. 30 tablet 1  . LORazepam (ATIVAN) 0.5 MG tablet Take 1 tablet (0.5 mg total) by mouth every 6 (six) hours as needed (Nausea or vomiting). 30 tablet 0  . ondansetron (ZOFRAN) 8 MG tablet TAKE ONE TABLET BY MOUTH TWICE DAILY AS NEEDED START  ON  THE  THIRD  DAY  AFTER  CHEMOTHERAPY 30 tablet 0  . prochlorperazine (COMPAZINE) 10 MG tablet TAKE ONE TABLET BY MOUTH EVERY 6 HOURS AS NEEDED FOR NAUSEA AND VOMITING 30 tablet 0   No current facility-administered medications for this visit.    PHYSICAL EXAMINATION: ECOG PERFORMANCE STATUS: 1 - Symptomatic but completely ambulatory  Filed Vitals:   07/13/15 0823  BP: 137/69  Pulse: 72  Temp: 98.7 F (37.1 C)   Filed Weights   07/13/15 0823  Weight: 219 lb 11.2 oz (99.655 kg)    GENERAL:alert, no distress and comfortable SKIN: skin color, texture, turgor are normal, no rashes or significant lesions EYES: normal, Conjunctiva are pink and non-injected, sclera clear OROPHARYNX:no exudate, no erythema and lips, buccal mucosa, and tongue normal  NECK: supple, thyroid normal size, non-tender, without nodularity LYMPH:  no palpable lymphadenopathy in the cervical, axillary or inguinal LUNGS: clear to auscultation and percussion with normal breathing effort HEART: regular  rate & rhythm and no murmurs and no lower extremity edema ABDOMEN:abdomen soft, non-tender and normal bowel sounds Musculoskeletal:no cyanosis of digits and no clubbing  NEURO: alert & oriented x 3 with fluent speech,  neuropathy grade 1   LABORATORY DATA:  I have reviewed the data as listed   Chemistry      Component Value Date/Time   NA 141 07/13/2015 0812   NA 133* 04/06/2015 0820   K 3.2* 07/13/2015 0812   K 5.3* 04/06/2015 0820   CL 101 04/06/2015 0820   CO2 30* 07/13/2015 0812   CO2 26 04/06/2015 0820   BUN 12.8 07/13/2015 0812   BUN 38* 04/06/2015 0820   CREATININE 1.1 07/13/2015 0812   CREATININE 1.30* 04/06/2015 0820      Component Value Date/Time   CALCIUM 9.4 07/13/2015 0812   CALCIUM 9.0 04/06/2015 0820   ALKPHOS 76 07/13/2015 0812   ALKPHOS 94 04/06/2015 0820   AST 27 07/13/2015 0812   AST 17 04/06/2015 0820   ALT 17 07/13/2015 0812   ALT 24 04/06/2015 0820   BILITOT 0.91 07/13/2015 0812   BILITOT 1.0 04/06/2015 0820       Lab Results  Component Value Date   WBC 3.2* 07/13/2015   HGB 8.8* 07/13/2015   HCT 25.9* 07/13/2015   MCV 93.5 07/13/2015   PLT 294 07/13/2015   NEUTROABS 1.8 07/13/2015    ASSESSMENT & PLAN:  Breast cancer of upper-outer quadrant of left female breast Left breast invasive mammary cancer 1:00 position: 3.1 x 2.9 x 3.6 cm tumor with multiple enlarged level MCCXIII left axillary lymph nodes largest measuring 4 cm per total congruent nor mass measures 10.2 cm ER 12%, PR 0%, Ki-67 100%, HER-2 negative ratio 1.53 CT chest abdomen pelvis and bone scan 03/17/2015: No evidence of metastatic disease Treatment plan: neoadjuvant chemotherapy with dose dense Adriamycin and Cytoxan followed by Abraxane weekly 12. Current treatment: Abraxane 7/12  Chemotherapy toxicities: 1. Severe neutropenia nadir counts: Decrease the dosage of chemotherapy for cycle 2 Adriamycin and Cytoxan 2. Thrush prescribed nystatin 3. Fatigue: Combination of chemotherapy, anemia 4. Intermittent nausea 5. Severe anemia 1 unit of packed red cells given. Today's hemoglobin 8.8 6. Dehydration: Encourage her to increase her fluid intake. We will give her 500 cc normal saline  today. 6. Leg cramps: Most likely related to dehydration. Encouraged her to drink more fluids. 7. Hypotension: I asked her to discontinue lisinopril with hydrochlorothiazide. Once her blood pressure comes up we can resume her antihypertensive medication. 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 9. Nail changes:nails are looking like they're brittle andspoon-shaped 10. Hypokalemia: Encourage repeat potassium containing foods  I reduce the dosage of chemotherapy to 70 mg/m ( with cycle 7) Return to clinic in 2 week for week 9/12 Abraxane Patient plans to go to [facility] for [employer] and is very excited about it.   No orders of the defined types were placed in this encounter.   The patient has a good understanding of the overall plan. she agrees with it. she will call with any problems that may develop before the next visit here.   Rulon Eisenmenger, MD     He still

## 2015-07-13 NOTE — Patient Instructions (Signed)
[facility] Discharge Instructions for Patients Receiving Chemotherapy  Today you received the following chemotherapy agents: Abraxane.  To help prevent nausea and vomiting after your treatment, we encourage you to take your nausea medication: Zofran. Take one every 8 hours as needed.   If you develop nausea and vomiting that is not controlled by your nausea medication, call the clinic.   BELOW ARE SYMPTOMS THAT SHOULD BE REPORTED IMMEDIATELY:  *FEVER GREATER THAN 100.5 F  *CHILLS WITH OR WITHOUT FEVER  NAUSEA AND VOMITING THAT IS NOT CONTROLLED WITH YOUR NAUSEA MEDICATION  *UNUSUAL SHORTNESS OF BREATH  *UNUSUAL BRUISING OR BLEEDING  TENDERNESS IN MOUTH AND THROAT WITH OR WITHOUT PRESENCE OF ULCERS  *URINARY PROBLEMS  *BOWEL PROBLEMS  UNUSUAL RASH Items with * indicate a potential emergency and should be followed up as soon as possible.  Feel free to call the clinic should you have any questions or concerns. The clinic phone number is [phone].  Please show the CHEMO ALERT CARD at check-in to the Emergency Department and triage nurse.   

## 2015-07-20 ENCOUNTER — Other Ambulatory Visit (HOSPITAL_BASED_OUTPATIENT_CLINIC_OR_DEPARTMENT_OTHER): Payer: Medicare Other

## 2015-07-20 ENCOUNTER — Ambulatory Visit (HOSPITAL_BASED_OUTPATIENT_CLINIC_OR_DEPARTMENT_OTHER): Payer: Medicare Other

## 2015-07-20 ENCOUNTER — Other Ambulatory Visit: Payer: Self-pay | Admitting: *Deleted

## 2015-07-20 VITALS — BP 150/76 | HR 85 | Temp 98.7°F | Resp 18

## 2015-07-20 DIAGNOSIS — C50412 Malignant neoplasm of upper-outer quadrant of left female breast: Secondary | ICD-10-CM

## 2015-07-20 DIAGNOSIS — C773 Secondary and unspecified malignant neoplasm of axilla and upper limb lymph nodes: Secondary | ICD-10-CM | POA: Diagnosis not present

## 2015-07-20 DIAGNOSIS — Z5111 Encounter for antineoplastic chemotherapy: Secondary | ICD-10-CM

## 2015-07-20 LAB — CBC WITH DIFFERENTIAL/PLATELET
BASO%: 1 % (ref 0.0–2.0)
Basophils Absolute: 0.1 10*3/uL (ref 0.0–0.1)
EOS%: 0.4 % (ref 0.0–7.0)
Eosinophils Absolute: 0 10*3/uL (ref 0.0–0.5)
HCT: 26.4 % — ABNORMAL LOW (ref 34.8–46.6)
HEMOGLOBIN: 8.8 g/dL — AB (ref 11.6–15.9)
LYMPH#: 1.3 10*3/uL (ref 0.9–3.3)
LYMPH%: 22.7 % (ref 14.0–49.7)
MCH: 31.7 pg (ref 25.1–34.0)
MCHC: 33.4 g/dL (ref 31.5–36.0)
MCV: 95.1 fL (ref 79.5–101.0)
MONO#: 0.5 10*3/uL (ref 0.1–0.9)
MONO%: 9.1 % (ref 0.0–14.0)
NEUT#: 3.8 10*3/uL (ref 1.5–6.5)
NEUT%: 66.8 % (ref 38.4–76.8)
Platelets: 305 10*3/uL (ref 145–400)
RBC: 2.78 10*6/uL — ABNORMAL LOW (ref 3.70–5.45)
RDW: 17.8 % — ABNORMAL HIGH (ref 11.2–14.5)
WBC: 5.6 10*3/uL (ref 3.9–10.3)

## 2015-07-20 LAB — COMPREHENSIVE METABOLIC PANEL (CC13)
ALT: 14 U/L (ref 0–55)
AST: 23 U/L (ref 5–34)
Albumin: 3.4 g/dL — ABNORMAL LOW (ref 3.5–5.0)
Alkaline Phosphatase: 75 U/L (ref 40–150)
Anion Gap: 9 mEq/L (ref 3–11)
BUN: 14.7 mg/dL (ref 7.0–26.0)
CO2: 28 mEq/L (ref 22–29)
Calcium: 9.8 mg/dL (ref 8.4–10.4)
Chloride: 102 mEq/L (ref 98–109)
Creatinine: 1.1 mg/dL (ref 0.6–1.1)
EGFR: 53 mL/min/{1.73_m2} — ABNORMAL LOW (ref >90)
Glucose: 117 mg/dl (ref 70–140)
Potassium: 4 mEq/L (ref 3.5–5.1)
Sodium: 139 mEq/L (ref 136–145)
Total Bilirubin: 0.82 mg/dL (ref 0.20–1.20)
Total Protein: 6.7 g/dL (ref 6.4–8.3)

## 2015-07-20 MED ORDER — SODIUM CHLORIDE 0.9 % IV SOLN
Freq: Once | INTRAVENOUS | Status: AC
  Administered 2015-07-20: 09:00:00 via INTRAVENOUS

## 2015-07-20 MED ORDER — SODIUM CHLORIDE 0.9 % IJ SOLN
10.0000 mL | INTRAMUSCULAR | Status: DC | PRN
  Administered 2015-07-20: 10 mL
  Filled 2015-07-20: qty 10

## 2015-07-20 MED ORDER — PACLITAXEL PROTEIN-BOUND CHEMO INJECTION 100 MG
70.0000 mg/m2 | Freq: Once | INTRAVENOUS | Status: AC
  Administered 2015-07-20: 150 mg via INTRAVENOUS
  Filled 2015-07-20: qty 30

## 2015-07-20 MED ORDER — SODIUM CHLORIDE 0.9 % IV SOLN
Freq: Once | INTRAVENOUS | Status: AC
  Administered 2015-07-20: 10:00:00 via INTRAVENOUS
  Filled 2015-07-20: qty 4

## 2015-07-20 MED ORDER — HEPARIN SOD (PORK) LOCK FLUSH 100 UNIT/ML IV SOLN
500.0000 [IU] | Freq: Once | INTRAVENOUS | Status: AC | PRN
  Administered 2015-07-20: 500 [IU]
  Filled 2015-07-20: qty 5

## 2015-07-20 MED ORDER — PROCHLORPERAZINE MALEATE 10 MG PO TABS: ORAL_TABLET | ORAL | Status: DC

## 2015-07-20 NOTE — Patient Instructions (Signed)
[facility] Discharge Instructions for Patients Receiving Chemotherapy  Today you received the following chemotherapy agents: Abraxane   To help prevent nausea and vomiting after your treatment, we encourage you to take your nausea medication as directed.    If you develop nausea and vomiting that is not controlled by your nausea medication, call the clinic.   BELOW ARE SYMPTOMS THAT SHOULD BE REPORTED IMMEDIATELY:  *FEVER GREATER THAN 100.5 F  *CHILLS WITH OR WITHOUT FEVER  NAUSEA AND VOMITING THAT IS NOT CONTROLLED WITH YOUR NAUSEA MEDICATION  *UNUSUAL SHORTNESS OF BREATH  *UNUSUAL BRUISING OR BLEEDING  TENDERNESS IN MOUTH AND THROAT WITH OR WITHOUT PRESENCE OF ULCERS  *URINARY PROBLEMS  *BOWEL PROBLEMS  UNUSUAL RASH Items with * indicate a potential emergency and should be followed up as soon as possible.  Feel free to call the clinic you have any questions or concerns. The clinic phone number is [phone].  Please show the CHEMO ALERT CARD at check-in to the Emergency Department and triage nurse.   

## 2015-07-20 NOTE — Telephone Encounter (Signed)
REFILL: COMPAZINE

## 2015-07-26 NOTE — Assessment & Plan Note (Addendum)
Left breast invasive mammary cancer 1:00 position: 3.1 x 2.9 x 3.6 cm tumor with multiple enlarged level MCCXIII left axillary lymph nodes largest measuring 4 cm per total congruent nor mass measures 10.2 cm ER 12%, PR 0%, Ki-67 100%, HER-2 negative ratio 1.53 CT chest abdomen pelvis and bone scan 03/17/2015: No evidence of metastatic disease Treatment plan: neoadjuvant chemotherapy with dose dense Adriamycin and Cytoxan followed by Abraxane weekly 12. Current treatment: Abraxane 9/12  Chemotherapy toxicities: 1. Severe neutropenia nadir counts: Decrease the dosage of chemotherapy for cycle 2 Adriamycin and Cytoxan 2. Thrush prescribed nystatin 3. Fatigue: Combination of chemotherapy, anemia 4. Intermittent nausea 5. Severe anemia 1 unit of packed red cells given. Today's hemoglobin 8.8 6. Dehydration: Encourage her to increase her fluid intake. We will give her 500 cc normal saline today. 6. Leg cramps: Most likely related to dehydration. Encouraged her to drink more fluids. 7. Hypotension: I asked her to discontinue lisinopril with hydrochlorothiazide. Once her blood pressure comes up we can resume her antihypertensive medication. 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 9. Nail changes:nails are looking like they're brittle andspoon-shaped 10. Hypokalemia: Encourage repeat potassium containing foods  I reduced the dosage of chemotherapy to 70 mg/m ( with cycle 7) Return to clinic in 3 week for week 12/12 Abraxane Patient plans to go to [facility] for [employer] and is very excited about it.

## 2015-07-27 ENCOUNTER — Telehealth: Payer: Self-pay | Admitting: Hematology and Oncology

## 2015-07-27 ENCOUNTER — Ambulatory Visit (HOSPITAL_BASED_OUTPATIENT_CLINIC_OR_DEPARTMENT_OTHER): Payer: Medicare Other

## 2015-07-27 ENCOUNTER — Encounter: Payer: Self-pay | Admitting: *Deleted

## 2015-07-27 ENCOUNTER — Ambulatory Visit: Payer: Medicare Other

## 2015-07-27 ENCOUNTER — Telehealth: Payer: Self-pay | Admitting: *Deleted

## 2015-07-27 ENCOUNTER — Encounter: Payer: Self-pay | Admitting: Hematology and Oncology

## 2015-07-27 ENCOUNTER — Other Ambulatory Visit (HOSPITAL_BASED_OUTPATIENT_CLINIC_OR_DEPARTMENT_OTHER): Payer: Medicare Other

## 2015-07-27 ENCOUNTER — Ambulatory Visit (HOSPITAL_BASED_OUTPATIENT_CLINIC_OR_DEPARTMENT_OTHER): Payer: Medicare Other | Admitting: Hematology and Oncology

## 2015-07-27 VITALS — BP 146/59 | HR 80 | Temp 98.4°F | Resp 18 | Ht 65.0 in | Wt 220.5 lb

## 2015-07-27 DIAGNOSIS — R110 Nausea: Secondary | ICD-10-CM

## 2015-07-27 DIAGNOSIS — D6481 Anemia due to antineoplastic chemotherapy: Secondary | ICD-10-CM

## 2015-07-27 DIAGNOSIS — Z5111 Encounter for antineoplastic chemotherapy: Secondary | ICD-10-CM

## 2015-07-27 DIAGNOSIS — C773 Secondary and unspecified malignant neoplasm of axilla and upper limb lymph nodes: Secondary | ICD-10-CM

## 2015-07-27 DIAGNOSIS — C50412 Malignant neoplasm of upper-outer quadrant of left female breast: Secondary | ICD-10-CM

## 2015-07-27 DIAGNOSIS — D709 Neutropenia, unspecified: Secondary | ICD-10-CM | POA: Diagnosis not present

## 2015-07-27 DIAGNOSIS — R5383 Other fatigue: Secondary | ICD-10-CM

## 2015-07-27 LAB — CBC WITH DIFFERENTIAL/PLATELET
BASO%: 1 % (ref 0.0–2.0)
BASOS ABS: 0.1 10*3/uL (ref 0.0–0.1)
EOS%: 0.9 % (ref 0.0–7.0)
Eosinophils Absolute: 0 10*3/uL (ref 0.0–0.5)
HCT: 25.7 % — ABNORMAL LOW (ref 34.8–46.6)
HGB: 8.5 g/dL — ABNORMAL LOW (ref 11.6–15.9)
LYMPH%: 21.2 % (ref 14.0–49.7)
MCH: 31.5 pg (ref 25.1–34.0)
MCHC: 33 g/dL (ref 31.5–36.0)
MCV: 95.4 fL (ref 79.5–101.0)
MONO#: 0.4 10*3/uL (ref 0.1–0.9)
MONO%: 8.4 % (ref 0.0–14.0)
NEUT%: 68.5 % (ref 38.4–76.8)
NEUTROS ABS: 3.6 10*3/uL (ref 1.5–6.5)
PLATELETS: 299 10*3/uL (ref 145–400)
RBC: 2.7 10*6/uL — ABNORMAL LOW (ref 3.70–5.45)
RDW: 17.3 % — ABNORMAL HIGH (ref 11.2–14.5)
WBC: 5.3 10*3/uL (ref 3.9–10.3)
lymph#: 1.1 10*3/uL (ref 0.9–3.3)

## 2015-07-27 LAB — COMPREHENSIVE METABOLIC PANEL (CC13)
ALBUMIN: 3.2 g/dL — AB (ref 3.5–5.0)
ALK PHOS: 80 U/L (ref 40–150)
ALT: 16 U/L (ref 0–55)
ANION GAP: 10 meq/L (ref 3–11)
AST: 22 U/L (ref 5–34)
BUN: 17 mg/dL (ref 7.0–26.0)
CALCIUM: 9.6 mg/dL (ref 8.4–10.4)
CHLORIDE: 103 meq/L (ref 98–109)
CO2: 27 mEq/L (ref 22–29)
Creatinine: 1 mg/dL (ref 0.6–1.1)
EGFR: 60 mL/min/{1.73_m2} — ABNORMAL LOW (ref >90)
GLUCOSE: 145 mg/dL — AB (ref 70–140)
POTASSIUM: 3.8 meq/L (ref 3.5–5.1)
Sodium: 140 mEq/L (ref 136–145)
Total Bilirubin: 0.6 mg/dL (ref 0.20–1.20)
Total Protein: 6.5 g/dL (ref 6.4–8.3)

## 2015-07-27 MED ORDER — PALONOSETRON HCL INJECTION 0.25 MG/5ML
INTRAVENOUS | Status: AC
  Filled 2015-07-27: qty 5

## 2015-07-27 MED ORDER — SODIUM CHLORIDE 0.9 % IV SOLN
Freq: Once | INTRAVENOUS | Status: AC
  Administered 2015-07-27: 10:00:00 via INTRAVENOUS

## 2015-07-27 MED ORDER — PACLITAXEL PROTEIN-BOUND CHEMO INJECTION 100 MG
70.0000 mg/m2 | Freq: Once | INTRAVENOUS | Status: AC
  Administered 2015-07-27: 150 mg via INTRAVENOUS
  Filled 2015-07-27: qty 30

## 2015-07-27 MED ORDER — SODIUM CHLORIDE 0.9 % IJ SOLN
10.0000 mL | INTRAMUSCULAR | Status: DC | PRN
  Administered 2015-07-27: 10 mL
  Filled 2015-07-27: qty 10

## 2015-07-27 MED ORDER — PALONOSETRON HCL INJECTION 0.25 MG/5ML
0.2500 mg | Freq: Once | INTRAVENOUS | Status: AC
  Administered 2015-07-27: 0.25 mg via INTRAVENOUS

## 2015-07-27 MED ORDER — HEPARIN SOD (PORK) LOCK FLUSH 100 UNIT/ML IV SOLN
500.0000 [IU] | Freq: Once | INTRAVENOUS | Status: AC | PRN
  Administered 2015-07-27: 500 [IU]
  Filled 2015-07-27: qty 5

## 2015-07-27 NOTE — Patient Instructions (Signed)
[facility] Discharge Instructions for Patients Receiving Chemotherapy  Today you received the following chemotherapy agents: Abraxane   To help prevent nausea and vomiting after your treatment, we encourage you to take your nausea medication as directed.    If you develop nausea and vomiting that is not controlled by your nausea medication, call the clinic.   BELOW ARE SYMPTOMS THAT SHOULD BE REPORTED IMMEDIATELY:  *FEVER GREATER THAN 100.5 F  *CHILLS WITH OR WITHOUT FEVER  NAUSEA AND VOMITING THAT IS NOT CONTROLLED WITH YOUR NAUSEA MEDICATION  *UNUSUAL SHORTNESS OF BREATH  *UNUSUAL BRUISING OR BLEEDING  TENDERNESS IN MOUTH AND THROAT WITH OR WITHOUT PRESENCE OF ULCERS  *URINARY PROBLEMS  *BOWEL PROBLEMS  UNUSUAL RASH Items with * indicate a potential emergency and should be followed up as soon as possible.  Feel free to call the clinic you have any questions or concerns. The clinic phone number is [phone].  Please show the CHEMO ALERT CARD at check-in to the Emergency Department and triage nurse.   

## 2015-07-27 NOTE — Telephone Encounter (Signed)
Per staff message and POF I have scheduled appts. Advised scheduler of appts. JMW  

## 2015-07-27 NOTE — Progress Notes (Signed)
Patient Care Team: Midge Minium, MD as PCP - General (Family Medicine) Mackie Pai, PA-C as Physician Assistant (Physician Assistant)  DIAGNOSIS: No matching staging information was found for the patient.  SUMMARY OF ONCOLOGIC HISTORY:   Breast cancer of upper-outer quadrant of left female breast   03/02/2015 Initial Diagnosis Left breast biopsy 1:00: Invasive mammary cancer, grade 3, 1 lymph node biopsy in the axilla positive, ER 12%, PR 0%, Ki-67 100%, HER-2 negative ratio 1.53   03/08/2015 Breast MRI Left breast 1:00 position: 3.6 cm mass with enlarged axillary lymph nodes multiple levels largest 4 cm, total mass measures 10.2 cm   03/30/2015 -  Neo-Adjuvant Chemotherapy Adriamycin Cytoxan 4 followed by Abraxane weekly 12    CHIEF COMPLIANT: fatigue and neuropathy and nausea  INTERVAL HISTORY: Cassandra Maldonado is a 69 year old with above-mentioned history left breast cancer continue adjuvant chemotherapy. She is currently on Abraxane weekly. She complains of nausea when she wakes up early in the morning. It gets better when she eats something. She does not have emesis. She is also moderately fatigued.  REVIEW OF SYSTEMS:   Constitutional: Denies fevers, chills or abnormal weight loss , fatigue Eyes: Denies blurriness of vision Ears, nose, mouth, throat, and face: Denies mucositis or sore throat Respiratory: Denies cough, dyspnea or wheezes Cardiovascular: Denies palpitation, chest discomfort or lower extremity swelling Gastrointestinal:  Complains of nausea Skin: Denies abnormal skin rashes Lymphatics: Denies new lymphadenopathy or easy bruising Neurological:Denies numbness, tingling or new weaknesses Behavioral/Psych: Mood is stable, no new changes  Breast:  denies any pain or lumps or nodules in either breasts All other systems were reviewed with the patient and are negative.  I have reviewed the past medical history, past surgical history, social history and family  history with the patient and they are unchanged from previous note.  ALLERGIES:  has No Known Allergies.  MEDICATIONS:  Current Outpatient Prescriptions  Medication Sig Dispense Refill  . acyclovir (ZOVIRAX) 400 MG tablet Take 1 tablet (400 mg total) by mouth 2 (two) times daily. 60 tablet 2  . allopurinol (ZYLOPRIM) 100 MG tablet TAKE ONE TABLET BY MOUTH TWICE DAILY 60 tablet 1  . furosemide (LASIX) 20 MG tablet Take 1 tablet (20 mg total) by mouth daily. 30 tablet 1  . LORazepam (ATIVAN) 0.5 MG tablet Take 1 tablet (0.5 mg total) by mouth every 6 (six) hours as needed (Nausea or vomiting). 30 tablet 0  . ondansetron (ZOFRAN) 8 MG tablet TAKE ONE TABLET BY MOUTH TWICE DAILY AS NEEDED START  ON  THE  THIRD  DAY  AFTER  CHEMOTHERAPY 30 tablet 0  . prochlorperazine (COMPAZINE) 10 MG tablet TAKE ONE TABLET BY MOUTH EVERY 6 HOURS AS NEEDED FOR NAUSEA AND VOMITING 30 tablet 0   No current facility-administered medications for this visit.    PHYSICAL EXAMINATION: ECOG PERFORMANCE STATUS: 1 - Symptomatic but completely ambulatory  Filed Vitals:   07/27/15 0850  BP: 146/59  Pulse: 80  Temp: 98.4 F (36.9 C)  Resp: 18   Filed Weights   07/27/15 0850  Weight: 220 lb 8 oz (100.018 kg)    GENERAL:alert, no distress and comfortable SKIN: skin color, texture, turgor are normal, no rashes or significant lesions EYES: normal, Conjunctiva are pink and non-injected, sclera clear OROPHARYNX:no exudate, no erythema and lips, buccal mucosa, and tongue normal  NECK: supple, thyroid normal size, non-tender, without nodularity LYMPH:  no palpable lymphadenopathy in the cervical, axillary or inguinal LUNGS: clear to auscultation and percussion with normal  breathing effort HEART: regular rate & rhythm and no murmurs and no lower extremity edema ABDOMEN:abdomen soft, non-tender and normal bowel sounds Musculoskeletal:no cyanosis of digits and no clubbing  NEURO: alert & oriented x 3 with fluent  speech, no focal motor/sensory deficits   LABORATORY DATA:  I have reviewed the data as listed   Chemistry      Component Value Date/Time   NA 139 07/20/2015 0812   NA 133* 04/06/2015 0820   K 4.0 07/20/2015 0812   K 5.3* 04/06/2015 0820   CL 101 04/06/2015 0820   CO2 28 07/20/2015 0812   CO2 26 04/06/2015 0820   BUN 14.7 07/20/2015 0812   BUN 38* 04/06/2015 0820   CREATININE 1.1 07/20/2015 0812   CREATININE 1.30* 04/06/2015 0820      Component Value Date/Time   CALCIUM 9.8 07/20/2015 0812   CALCIUM 9.0 04/06/2015 0820   ALKPHOS 75 07/20/2015 0812   ALKPHOS 94 04/06/2015 0820   AST 23 07/20/2015 0812   AST 17 04/06/2015 0820   ALT 14 07/20/2015 0812   ALT 24 04/06/2015 0820   BILITOT 0.82 07/20/2015 0812   BILITOT 1.0 04/06/2015 0820       Lab Results  Component Value Date   WBC 5.3 07/27/2015   HGB 8.5* 07/27/2015   HCT 25.7* 07/27/2015   MCV 95.4 07/27/2015   PLT 299 07/27/2015   NEUTROABS 3.6 07/27/2015     RADIOGRAPHIC STUDIES: I have personally reviewed the radiology reports and agreed with their findings. No results found.   ASSESSMENT & PLAN:  Breast cancer of upper-outer quadrant of left female breast Left breast invasive mammary cancer 1:00 position: 3.1 x 2.9 x 3.6 cm tumor with multiple enlarged level MCCXIII left axillary lymph nodes largest measuring 4 cm per total congruent nor mass measures 10.2 cm ER 12%, PR 0%, Ki-67 100%, HER-2 negative ratio 1.53 CT chest abdomen pelvis and bone scan 03/17/2015: No evidence of metastatic disease Treatment plan: neoadjuvant chemotherapy with dose dense Adriamycin and Cytoxan followed by Abraxane weekly 12. Current treatment: Abraxane 9/12  Chemotherapy toxicities: 1. Severe neutropenia nadir counts: Decrease the dosage of chemotherapy for cycle 2 Adriamycin and Cytoxan 2. Thrush prescribed nystatin 3. Fatigue: Combination of chemotherapy, anemia 4. Intermittent nausea 5. Severe anemia 1 unit of packed  red cells given. Today's hemoglobin 8.8 6. Dehydration: Encourage her to increase her fluid intake. We will give her 500 cc normal saline today. 6. Leg cramps: Most likely related to dehydration. Encouraged her to drink more fluids. 7. Hypotension: I asked her to discontinue lisinopril with hydrochlorothiazide. Once her blood pressure comes up we can resume her antihypertensive medication. 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 9. Nail changes:nails are looking like they're brittle andspoon-shaped 10. Hypokalemia: Encourage repeat potassium containing foods  I reduced the dosage of chemotherapy to 70 mg/m ( with cycle 7) Return to clinic in  Next Tuesday for cycle 10, Being moved up for her trip to [facility] Patient plans to go to [facility] for [employer] and is very excited about it.  No orders of the defined types were placed in this encounter.   The patient has a good understanding of the overall plan. she agrees with it. she will call with any problems that may develop before the next visit here.   Rulon Eisenmenger, MD

## 2015-07-27 NOTE — Telephone Encounter (Signed)
Appointments made and avs printed for patient,email to michelle to add chemo

## 2015-07-31 NOTE — Assessment & Plan Note (Signed)
Left breast invasive mammary cancer 1:00 position: 3.1 x 2.9 x 3.6 cm tumor with multiple enlarged level MCCXIII left axillary lymph nodes largest measuring 4 cm per total congruent nor mass measures 10.2 cm ER 12%, PR 0%, Ki-67 100%, HER-2 negative ratio 1.53 CT chest abdomen pelvis and bone scan 03/17/2015: No evidence of metastatic disease Treatment plan: neoadjuvant chemotherapy with dose dense Adriamycin and Cytoxan followed by Abraxane weekly 12. Current treatment: Abraxane 10/12  Chemotherapy toxicities: 1. Severe neutropenia nadir counts: Decrease the dosage of chemotherapy for cycle 2 Adriamycin and Cytoxan 2. Thrush prescribed nystatin 3. Fatigue: Combination of chemotherapy, anemia 4. Intermittent nausea 5. Severe anemia 1 unit of packed red cells given. Today's hemoglobin 8.8 6. Dehydration: Encourage her to increase her fluid intake. We will give her 500 cc normal saline today. 6. Leg cramps: Most likely related to dehydration. Encouraged her to drink more fluids. 7. Hypotension: I asked her to discontinue lisinopril with hydrochlorothiazide. Once her blood pressure comes up we can resume her antihypertensive medication. 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 9. Nail changes:nails are looking like they're brittle andspoon-shaped 10. Hypokalemia: Encourage repeat potassium containing foods  I reduced the dosage of chemotherapy to 70 mg/m ( with cycle 7) Return to clinic in 2 week for week 12/12 Abraxane Patient plans to go to [facility] for [employer] and is very excited about it.

## 2015-08-01 ENCOUNTER — Encounter: Payer: Self-pay | Admitting: Hematology and Oncology

## 2015-08-01 ENCOUNTER — Ambulatory Visit (HOSPITAL_BASED_OUTPATIENT_CLINIC_OR_DEPARTMENT_OTHER): Payer: Medicare Other

## 2015-08-01 ENCOUNTER — Ambulatory Visit (HOSPITAL_BASED_OUTPATIENT_CLINIC_OR_DEPARTMENT_OTHER): Payer: Medicare Other | Admitting: Hematology and Oncology

## 2015-08-01 ENCOUNTER — Other Ambulatory Visit (HOSPITAL_BASED_OUTPATIENT_CLINIC_OR_DEPARTMENT_OTHER): Payer: Medicare Other

## 2015-08-01 VITALS — BP 140/74 | HR 81 | Temp 98.6°F | Resp 18 | Ht 65.0 in | Wt 219.5 lb

## 2015-08-01 DIAGNOSIS — G620 Drug-induced polyneuropathy: Secondary | ICD-10-CM

## 2015-08-01 DIAGNOSIS — C773 Secondary and unspecified malignant neoplasm of axilla and upper limb lymph nodes: Secondary | ICD-10-CM

## 2015-08-01 DIAGNOSIS — D649 Anemia, unspecified: Secondary | ICD-10-CM

## 2015-08-01 DIAGNOSIS — Z5111 Encounter for antineoplastic chemotherapy: Secondary | ICD-10-CM

## 2015-08-01 DIAGNOSIS — C50412 Malignant neoplasm of upper-outer quadrant of left female breast: Secondary | ICD-10-CM

## 2015-08-01 DIAGNOSIS — D709 Neutropenia, unspecified: Secondary | ICD-10-CM | POA: Diagnosis not present

## 2015-08-01 DIAGNOSIS — R5383 Other fatigue: Secondary | ICD-10-CM

## 2015-08-01 LAB — COMPREHENSIVE METABOLIC PANEL (CC13)
ALK PHOS: 79 U/L (ref 40–150)
ALT: 15 U/L (ref 0–55)
ANION GAP: 10 meq/L (ref 3–11)
AST: 20 U/L (ref 5–34)
Albumin: 3.5 g/dL (ref 3.5–5.0)
BUN: 14.6 mg/dL (ref 7.0–26.0)
CALCIUM: 9.6 mg/dL (ref 8.4–10.4)
CO2: 28 meq/L (ref 22–29)
Chloride: 104 mEq/L (ref 98–109)
Creatinine: 1 mg/dL (ref 0.6–1.1)
EGFR: 60 mL/min/{1.73_m2} — ABNORMAL LOW (ref >90)
Glucose: 124 mg/dl (ref 70–140)
Potassium: 3.7 mEq/L (ref 3.5–5.1)
SODIUM: 141 meq/L (ref 136–145)
Total Bilirubin: 0.7 mg/dL (ref 0.20–1.20)
Total Protein: 6.7 g/dL (ref 6.4–8.3)

## 2015-08-01 LAB — CBC WITH DIFFERENTIAL/PLATELET
BASO%: 1.3 % (ref 0.0–2.0)
BASOS ABS: 0.1 10*3/uL (ref 0.0–0.1)
EOS%: 0.7 % (ref 0.0–7.0)
Eosinophils Absolute: 0 10*3/uL (ref 0.0–0.5)
HEMATOCRIT: 27.4 % — AB (ref 34.8–46.6)
HEMOGLOBIN: 9.3 g/dL — AB (ref 11.6–15.9)
LYMPH%: 25.1 % (ref 14.0–49.7)
MCH: 32.3 pg (ref 25.1–34.0)
MCHC: 33.9 g/dL (ref 31.5–36.0)
MCV: 95.4 fL (ref 79.5–101.0)
MONO#: 0.2 10*3/uL (ref 0.1–0.9)
MONO%: 5.1 % (ref 0.0–14.0)
NEUT#: 2.9 10*3/uL (ref 1.5–6.5)
NEUT%: 67.8 % (ref 38.4–76.8)
Platelets: 334 10*3/uL (ref 145–400)
RBC: 2.87 10*6/uL — AB (ref 3.70–5.45)
RDW: 16.8 % — ABNORMAL HIGH (ref 11.2–14.5)
WBC: 4.3 10*3/uL (ref 3.9–10.3)
lymph#: 1.1 10*3/uL (ref 0.9–3.3)

## 2015-08-01 MED ORDER — SODIUM CHLORIDE 0.9 % IV SOLN
Freq: Once | INTRAVENOUS | Status: AC
  Administered 2015-08-01: 09:00:00 via INTRAVENOUS

## 2015-08-01 MED ORDER — PALONOSETRON HCL INJECTION 0.25 MG/5ML
0.2500 mg | Freq: Once | INTRAVENOUS | Status: AC
  Administered 2015-08-01: 0.25 mg via INTRAVENOUS

## 2015-08-01 MED ORDER — PACLITAXEL PROTEIN-BOUND CHEMO INJECTION 100 MG
70.0000 mg/m2 | Freq: Once | INTRAVENOUS | Status: AC
  Administered 2015-08-01: 150 mg via INTRAVENOUS
  Filled 2015-08-01: qty 30

## 2015-08-01 MED ORDER — SODIUM CHLORIDE 0.9 % IJ SOLN
10.0000 mL | INTRAMUSCULAR | Status: DC | PRN
  Administered 2015-08-01: 10 mL
  Filled 2015-08-01: qty 10

## 2015-08-01 MED ORDER — HEPARIN SOD (PORK) LOCK FLUSH 100 UNIT/ML IV SOLN
500.0000 [IU] | Freq: Once | INTRAVENOUS | Status: AC | PRN
  Administered 2015-08-01: 500 [IU]
  Filled 2015-08-01: qty 5

## 2015-08-01 MED ORDER — PALONOSETRON HCL INJECTION 0.25 MG/5ML
INTRAVENOUS | Status: AC
Start: 2015-08-01 — End: 2015-08-01
  Filled 2015-08-01: qty 5

## 2015-08-01 NOTE — Progress Notes (Signed)
Patient Care Team: Midge Minium, MD as PCP - General (Family Medicine) Mackie Pai, PA-C as Physician Assistant (Physician Assistant)  DIAGNOSIS: No matching staging information was found for the patient.  SUMMARY OF ONCOLOGIC HISTORY:   Breast cancer of upper-outer quadrant of left female breast   03/02/2015 Initial Diagnosis Left breast biopsy 1:00: Invasive mammary cancer, grade 3, 1 lymph node biopsy in the axilla positive, ER 12%, PR 0%, Ki-67 100%, HER-2 negative ratio 1.53   03/08/2015 Breast MRI Left breast 1:00 position: 3.6 cm mass with enlarged axillary lymph nodes multiple levels largest 4 cm, total mass measures 10.2 cm   03/30/2015 -  Neo-Adjuvant Chemotherapy Adriamycin Cytoxan 4 followed by Abraxane weekly 12    CHIEF COMPLIANT: patient here for cycle 10 Abraxane  INTERVAL HISTORY: Cassandra Maldonado is a 69 year old with above-mentioned history of left breast cancer who is here for cycle 10 of Abraxane. She is getting today's treatment a few days ahead of her normal schedule because she plans to go to [facility] for tournament to accompany her husband will plays Billiards. She reports continued problems with fatigue but otherwise no new issues or concerns. Denies any neuropathy.  REVIEW OF SYSTEMS:   Constitutional: Denies fevers, chills or abnormal weight loss Eyes: Denies blurriness of vision Ears, nose, mouth, throat, and face: Denies mucositis or sore throat Respiratory: Denies cough, dyspnea or wheezes Cardiovascular: Denies palpitation, chest discomfort or lower extremity swelling Gastrointestinal:  Nausea especially in the morning Skin: Denies abnormal skin rashes Lymphatics: Denies new lymphadenopathy or easy bruising Neurological:Denies numbness, tingling or new weaknesses Behavioral/Psych: Mood is stable, no new changes  Breast:  denies any pain or lumps or nodules in either breasts All other systems were reviewed with the patient and are negative.  I  have reviewed the past medical history, past surgical history, social history and family history with the patient and they are unchanged from previous note.  ALLERGIES:  has No Known Allergies.  MEDICATIONS:  Current Outpatient Prescriptions  Medication Sig Dispense Refill  . acyclovir (ZOVIRAX) 400 MG tablet Take 1 tablet (400 mg total) by mouth 2 (two) times daily. 60 tablet 2  . allopurinol (ZYLOPRIM) 100 MG tablet TAKE ONE TABLET BY MOUTH TWICE DAILY 60 tablet 1  . furosemide (LASIX) 20 MG tablet Take 1 tablet (20 mg total) by mouth daily. 30 tablet 1  . LORazepam (ATIVAN) 0.5 MG tablet Take 1 tablet (0.5 mg total) by mouth every 6 (six) hours as needed (Nausea or vomiting). 30 tablet 0  . ondansetron (ZOFRAN) 8 MG tablet TAKE ONE TABLET BY MOUTH TWICE DAILY AS NEEDED START  ON  THE  THIRD  DAY  AFTER  CHEMOTHERAPY 30 tablet 0  . prochlorperazine (COMPAZINE) 10 MG tablet TAKE ONE TABLET BY MOUTH EVERY 6 HOURS AS NEEDED FOR NAUSEA AND VOMITING 30 tablet 0   No current facility-administered medications for this visit.    PHYSICAL EXAMINATION: ECOG PERFORMANCE STATUS: 1 - Symptomatic but completely ambulatory  Filed Vitals:   08/01/15 0815  BP: 140/74  Pulse: 81  Temp: 98.6 F (37 C)  Resp: 18   Filed Weights   08/01/15 0815  Weight: 219 lb 8 oz (99.565 kg)    GENERAL:alert, no distress and comfortable SKIN: skin color, texture, turgor are normal, no rashes or significant lesions EYES: normal, Conjunctiva are pink and non-injected, sclera clear OROPHARYNX:no exudate, no erythema and lips, buccal mucosa, and tongue normal  NECK: supple, thyroid normal size, non-tender, without nodularity LYMPH:  no palpable lymphadenopathy in the cervical, axillary or inguinal LUNGS: clear to auscultation and percussion with normal breathing effort HEART: regular rate & rhythm and no murmurs and no lower extremity edema ABDOMEN:abdomen soft, non-tender and normal bowel  sounds Musculoskeletal:no cyanosis of digits and no clubbing  NEURO: alert & oriented x 3 with fluent speech, no focal motor/sensory deficits LABORATORY DATA:  I have reviewed the data as listed   Chemistry      Component Value Date/Time   NA 140 07/27/2015 0836   NA 133* 04/06/2015 0820   K 3.8 07/27/2015 0836   K 5.3* 04/06/2015 0820   CL 101 04/06/2015 0820   CO2 27 07/27/2015 0836   CO2 26 04/06/2015 0820   BUN 17.0 07/27/2015 0836   BUN 38* 04/06/2015 0820   CREATININE 1.0 07/27/2015 0836   CREATININE 1.30* 04/06/2015 0820      Component Value Date/Time   CALCIUM 9.6 07/27/2015 0836   CALCIUM 9.0 04/06/2015 0820   ALKPHOS 80 07/27/2015 0836   ALKPHOS 94 04/06/2015 0820   AST 22 07/27/2015 0836   AST 17 04/06/2015 0820   ALT 16 07/27/2015 0836   ALT 24 04/06/2015 0820   BILITOT 0.60 07/27/2015 0836   BILITOT 1.0 04/06/2015 0820       Lab Results  Component Value Date   WBC 5.3 07/27/2015   HGB 8.5* 07/27/2015   HCT 25.7* 07/27/2015   MCV 95.4 07/27/2015   PLT 299 07/27/2015   NEUTROABS 3.6 07/27/2015     RADIOGRAPHIC STUDIES: I have personally reviewed the radiology reports and agreed with their findings. No results found.   ASSESSMENT & PLAN:  Breast cancer of upper-outer quadrant of left female breast Left breast invasive mammary cancer 1:00 position: 3.1 x 2.9 x 3.6 cm tumor with multiple enlarged level MCCXIII left axillary lymph nodes largest measuring 4 cm per total congruent nor mass measures 10.2 cm ER 12%, PR 0%, Ki-67 100%, HER-2 negative ratio 1.53 CT chest abdomen pelvis and bone scan 03/17/2015: No evidence of metastatic disease Treatment plan: neoadjuvant chemotherapy with dose dense Adriamycin and Cytoxan followed by Abraxane weekly 12. Current treatment: Abraxane 10/12  Chemotherapy toxicities: 1. Severe neutropenia nadir counts: Decrease the dosage of chemotherapy for cycle 2 Adriamycin and Cytoxan 2. Thrush prescribed nystatin 3.  Fatigue: Combination of chemotherapy, anemia 4. Intermittent nausea 5. Severe anemia 1 unit of packed red cells given. Today's hemoglobin 8.8 6. Dehydration: Encourage her to increase her fluid intake. We will give her 500 cc normal saline today. 6. Leg cramps: Most likely related to dehydration. Encouraged her to drink more fluids. 7. Hypotension: I asked her to discontinue lisinopril with hydrochlorothiazide. Once her blood pressure comes up we can resume her antihypertensive medication. 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 9. Nail changes:nails are looking like they're brittle andspoon-shaped 10. Hypokalemia: Encourage repeat potassium containing foods  I reduced the dosage of chemotherapy to 70 mg/m ( with cycle 7) Return to clinic in 2 week for week 12/12 Abraxane Patient plans to go to [facility] for [employer] and is very excited about it.     No orders of the defined types were placed in this encounter.   The patient has a good understanding of the overall plan. she agrees with it. she will call with any problems that may develop before the next visit here.   Rulon Eisenmenger, MD

## 2015-08-01 NOTE — Patient Instructions (Signed)
[facility] Discharge Instructions for Patients Receiving Chemotherapy  Today you received the following chemotherapy agents: Abraxane   To help prevent nausea and vomiting after your treatment, we encourage you to take your nausea medication as directed.    If you develop nausea and vomiting that is not controlled by your nausea medication, call the clinic.   BELOW ARE SYMPTOMS THAT SHOULD BE REPORTED IMMEDIATELY:  *FEVER GREATER THAN 100.5 F  *CHILLS WITH OR WITHOUT FEVER  NAUSEA AND VOMITING THAT IS NOT CONTROLLED WITH YOUR NAUSEA MEDICATION  *UNUSUAL SHORTNESS OF BREATH  *UNUSUAL BRUISING OR BLEEDING  TENDERNESS IN MOUTH AND THROAT WITH OR WITHOUT PRESENCE OF ULCERS  *URINARY PROBLEMS  *BOWEL PROBLEMS  UNUSUAL RASH Items with * indicate a potential emergency and should be followed up as soon as possible.  Feel free to call the clinic you have any questions or concerns. The clinic phone number is [phone].  Please show the CHEMO ALERT CARD at check-in to the Emergency Department and triage nurse.   

## 2015-08-03 ENCOUNTER — Other Ambulatory Visit: Payer: Medicare Other

## 2015-08-03 ENCOUNTER — Ambulatory Visit: Payer: Medicare Other

## 2015-08-10 ENCOUNTER — Encounter: Payer: Self-pay | Admitting: *Deleted

## 2015-08-10 ENCOUNTER — Ambulatory Visit (HOSPITAL_BASED_OUTPATIENT_CLINIC_OR_DEPARTMENT_OTHER): Payer: Medicare Other

## 2015-08-10 ENCOUNTER — Other Ambulatory Visit (HOSPITAL_BASED_OUTPATIENT_CLINIC_OR_DEPARTMENT_OTHER): Payer: Medicare Other

## 2015-08-10 VITALS — BP 140/71 | HR 76 | Temp 98.8°F | Resp 16

## 2015-08-10 DIAGNOSIS — C773 Secondary and unspecified malignant neoplasm of axilla and upper limb lymph nodes: Secondary | ICD-10-CM

## 2015-08-10 DIAGNOSIS — Z5111 Encounter for antineoplastic chemotherapy: Secondary | ICD-10-CM

## 2015-08-10 DIAGNOSIS — C50412 Malignant neoplasm of upper-outer quadrant of left female breast: Secondary | ICD-10-CM

## 2015-08-10 LAB — CBC WITH DIFFERENTIAL/PLATELET
BASO%: 0.8 % (ref 0.0–2.0)
BASOS ABS: 0 10*3/uL (ref 0.0–0.1)
EOS ABS: 0 10*3/uL (ref 0.0–0.5)
EOS%: 0.8 % (ref 0.0–7.0)
HCT: 26.1 % — ABNORMAL LOW (ref 34.8–46.6)
HGB: 8.8 g/dL — ABNORMAL LOW (ref 11.6–15.9)
LYMPH%: 17.8 % (ref 14.0–49.7)
MCH: 31.9 pg (ref 25.1–34.0)
MCHC: 33.7 g/dL (ref 31.5–36.0)
MCV: 94.6 fL (ref 79.5–101.0)
MONO#: 0.8 10*3/uL (ref 0.1–0.9)
MONO%: 14.5 % — AB (ref 0.0–14.0)
NEUT#: 3.7 10*3/uL (ref 1.5–6.5)
NEUT%: 66.1 % (ref 38.4–76.8)
Platelets: 344 10*3/uL (ref 145–400)
RBC: 2.76 10*6/uL — AB (ref 3.70–5.45)
RDW: 16.7 % — ABNORMAL HIGH (ref 11.2–14.5)
WBC: 5.7 10*3/uL (ref 3.9–10.3)
lymph#: 1 10*3/uL (ref 0.9–3.3)

## 2015-08-10 LAB — COMPREHENSIVE METABOLIC PANEL (CC13)
ALT: 10 U/L (ref 0–55)
ANION GAP: 10 meq/L (ref 3–11)
AST: 16 U/L (ref 5–34)
Albumin: 3.2 g/dL — ABNORMAL LOW (ref 3.5–5.0)
Alkaline Phosphatase: 74 U/L (ref 40–150)
BUN: 20.1 mg/dL (ref 7.0–26.0)
CO2: 28 meq/L (ref 22–29)
Calcium: 9.6 mg/dL (ref 8.4–10.4)
Chloride: 101 mEq/L (ref 98–109)
Creatinine: 1.1 mg/dL (ref 0.6–1.1)
EGFR: 51 mL/min/{1.73_m2} — AB (ref >90)
GLUCOSE: 125 mg/dL (ref 70–140)
POTASSIUM: 3.4 meq/L — AB (ref 3.5–5.1)
SODIUM: 139 meq/L (ref 136–145)
Total Bilirubin: 0.56 mg/dL (ref 0.20–1.20)
Total Protein: 6.5 g/dL (ref 6.4–8.3)

## 2015-08-10 MED ORDER — PALONOSETRON HCL INJECTION 0.25 MG/5ML
INTRAVENOUS | Status: AC
  Filled 2015-08-10: qty 5

## 2015-08-10 MED ORDER — SODIUM CHLORIDE 0.9 % IJ SOLN
10.0000 mL | INTRAMUSCULAR | Status: DC | PRN
  Administered 2015-08-10: 10 mL
  Filled 2015-08-10: qty 10

## 2015-08-10 MED ORDER — SODIUM CHLORIDE 0.9 % IV SOLN
Freq: Once | INTRAVENOUS | Status: AC
  Administered 2015-08-10: 09:00:00 via INTRAVENOUS

## 2015-08-10 MED ORDER — HEPARIN SOD (PORK) LOCK FLUSH 100 UNIT/ML IV SOLN
500.0000 [IU] | Freq: Once | INTRAVENOUS | Status: AC | PRN
  Administered 2015-08-10: 500 [IU]
  Filled 2015-08-10: qty 5

## 2015-08-10 MED ORDER — PACLITAXEL PROTEIN-BOUND CHEMO INJECTION 100 MG
70.0000 mg/m2 | Freq: Once | INTRAVENOUS | Status: AC
  Administered 2015-08-10: 150 mg via INTRAVENOUS
  Filled 2015-08-10: qty 30

## 2015-08-10 MED ORDER — PALONOSETRON HCL INJECTION 0.25 MG/5ML
0.2500 mg | Freq: Once | INTRAVENOUS | Status: AC
Start: 2015-08-10 — End: 2015-08-10
  Administered 2015-08-10: 0.25 mg via INTRAVENOUS

## 2015-08-10 NOTE — Patient Instructions (Signed)
[facility] Discharge Instructions for Patients Receiving Chemotherapy  Today you received the following chemotherapy agents:  Abraxane  To help prevent nausea and vomiting after your treatment, we encourage you to take your nausea medication as ordered per MD.   If you develop nausea and vomiting that is not controlled by your nausea medication, call the clinic.   BELOW ARE SYMPTOMS THAT SHOULD BE REPORTED IMMEDIATELY:  *FEVER GREATER THAN 100.5 F  *CHILLS WITH OR WITHOUT FEVER  NAUSEA AND VOMITING THAT IS NOT CONTROLLED WITH YOUR NAUSEA MEDICATION  *UNUSUAL SHORTNESS OF BREATH  *UNUSUAL BRUISING OR BLEEDING  TENDERNESS IN MOUTH AND THROAT WITH OR WITHOUT PRESENCE OF ULCERS  *URINARY PROBLEMS  *BOWEL PROBLEMS  UNUSUAL RASH Items with * indicate a potential emergency and should be followed up as soon as possible.  Feel free to call the clinic you have any questions or concerns. The clinic phone number is [phone].  Please show the CHEMO ALERT CARD at check-in to the Emergency Department and triage nurse.   

## 2015-08-17 ENCOUNTER — Encounter: Payer: Self-pay | Admitting: Hematology and Oncology

## 2015-08-17 ENCOUNTER — Other Ambulatory Visit (HOSPITAL_BASED_OUTPATIENT_CLINIC_OR_DEPARTMENT_OTHER): Payer: Medicare Other

## 2015-08-17 ENCOUNTER — Ambulatory Visit (HOSPITAL_BASED_OUTPATIENT_CLINIC_OR_DEPARTMENT_OTHER): Payer: Medicare Other

## 2015-08-17 ENCOUNTER — Ambulatory Visit (HOSPITAL_BASED_OUTPATIENT_CLINIC_OR_DEPARTMENT_OTHER): Payer: Medicare Other | Admitting: Hematology and Oncology

## 2015-08-17 ENCOUNTER — Encounter: Payer: Self-pay | Admitting: *Deleted

## 2015-08-17 ENCOUNTER — Ambulatory Visit: Payer: Medicare Other

## 2015-08-17 VITALS — BP 144/71 | HR 78 | Temp 98.2°F | Resp 18 | Ht 65.0 in | Wt 214.4 lb

## 2015-08-17 DIAGNOSIS — Z5111 Encounter for antineoplastic chemotherapy: Secondary | ICD-10-CM | POA: Diagnosis not present

## 2015-08-17 DIAGNOSIS — I10 Essential (primary) hypertension: Secondary | ICD-10-CM

## 2015-08-17 DIAGNOSIS — C50412 Malignant neoplasm of upper-outer quadrant of left female breast: Secondary | ICD-10-CM | POA: Diagnosis not present

## 2015-08-17 DIAGNOSIS — C773 Secondary and unspecified malignant neoplasm of axilla and upper limb lymph nodes: Secondary | ICD-10-CM

## 2015-08-17 LAB — CBC WITH DIFFERENTIAL/PLATELET
BASO%: 0.9 % (ref 0.0–2.0)
Basophils Absolute: 0.1 10*3/uL (ref 0.0–0.1)
EOS%: 0.8 % (ref 0.0–7.0)
Eosinophils Absolute: 0 10*3/uL (ref 0.0–0.5)
HEMATOCRIT: 27.6 % — AB (ref 34.8–46.6)
HGB: 9.3 g/dL — ABNORMAL LOW (ref 11.6–15.9)
LYMPH#: 1.5 10*3/uL (ref 0.9–3.3)
LYMPH%: 24.3 % (ref 14.0–49.7)
MCH: 31.5 pg (ref 25.1–34.0)
MCHC: 33.6 g/dL (ref 31.5–36.0)
MCV: 93.7 fL (ref 79.5–101.0)
MONO#: 0.4 10*3/uL (ref 0.1–0.9)
MONO%: 6.4 % (ref 0.0–14.0)
NEUT%: 67.6 % (ref 38.4–76.8)
NEUTROS ABS: 4.2 10*3/uL (ref 1.5–6.5)
PLATELETS: 378 10*3/uL (ref 145–400)
RBC: 2.95 10*6/uL — AB (ref 3.70–5.45)
RDW: 16.2 % — AB (ref 11.2–14.5)
WBC: 6.2 10*3/uL (ref 3.9–10.3)

## 2015-08-17 LAB — COMPREHENSIVE METABOLIC PANEL (CC13)
ALT: 15 U/L (ref 0–55)
ANION GAP: 10 meq/L (ref 3–11)
AST: 22 U/L (ref 5–34)
Albumin: 3.2 g/dL — ABNORMAL LOW (ref 3.5–5.0)
Alkaline Phosphatase: 97 U/L (ref 40–150)
BILIRUBIN TOTAL: 0.54 mg/dL (ref 0.20–1.20)
BUN: 14.3 mg/dL (ref 7.0–26.0)
CO2: 29 meq/L (ref 22–29)
CREATININE: 1 mg/dL (ref 0.6–1.1)
Calcium: 10 mg/dL (ref 8.4–10.4)
Chloride: 101 mEq/L (ref 98–109)
EGFR: 59 mL/min/{1.73_m2} — ABNORMAL LOW (ref >90)
Glucose: 132 mg/dl (ref 70–140)
Potassium: 3.6 mEq/L (ref 3.5–5.1)
Sodium: 140 mEq/L (ref 136–145)
TOTAL PROTEIN: 6.7 g/dL (ref 6.4–8.3)

## 2015-08-17 MED ORDER — HEPARIN SOD (PORK) LOCK FLUSH 100 UNIT/ML IV SOLN
500.0000 [IU] | Freq: Once | INTRAVENOUS | Status: AC | PRN
  Administered 2015-08-17: 500 [IU]
  Filled 2015-08-17: qty 5

## 2015-08-17 MED ORDER — SODIUM CHLORIDE 0.9 % IV SOLN
Freq: Once | INTRAVENOUS | Status: AC
  Administered 2015-08-17: 10:00:00 via INTRAVENOUS

## 2015-08-17 MED ORDER — PALONOSETRON HCL INJECTION 0.25 MG/5ML
INTRAVENOUS | Status: AC
  Filled 2015-08-17: qty 5

## 2015-08-17 MED ORDER — PACLITAXEL PROTEIN-BOUND CHEMO INJECTION 100 MG
70.0000 mg/m2 | Freq: Once | INTRAVENOUS | Status: AC
  Administered 2015-08-17: 150 mg via INTRAVENOUS
  Filled 2015-08-17: qty 30

## 2015-08-17 MED ORDER — SODIUM CHLORIDE 0.9 % IJ SOLN
10.0000 mL | INTRAMUSCULAR | Status: DC | PRN
  Administered 2015-08-17: 10 mL
  Filled 2015-08-17: qty 10

## 2015-08-17 MED ORDER — PALONOSETRON HCL INJECTION 0.25 MG/5ML
0.2500 mg | Freq: Once | INTRAVENOUS | Status: AC
  Administered 2015-08-17: 0.25 mg via INTRAVENOUS

## 2015-08-17 NOTE — Patient Instructions (Signed)

## 2015-08-17 NOTE — Progress Notes (Signed)
Patient Care Team: Midge Minium, MD as PCP - General (Family Medicine) Mackie Pai, PA-C as Physician Assistant (Physician Assistant)  DIAGNOSIS: No matching staging information was found for the patient.  SUMMARY OF ONCOLOGIC HISTORY:   Breast cancer of upper-outer quadrant of left female breast   03/02/2015 Initial Diagnosis Left breast biopsy 1:00: Invasive mammary cancer, grade 3, 1 lymph node biopsy in the axilla positive, ER 12%, PR 0%, Ki-67 100%, HER-2 negative ratio 1.53   03/08/2015 Breast MRI Left breast 1:00 position: 3.6 cm mass with enlarged axillary lymph nodes multiple levels largest 4 cm, total mass measures 10.2 cm   03/30/2015 -  Neo-Adjuvant Chemotherapy Adriamycin Cytoxan 4 followed by Abraxane weekly 12    CHIEF COMPLIANT: Cycle 12 Abraxane  INTERVAL HISTORY: Cassandra Maldonado is a 69-year-old with above-mentioned history left breast cancer currently new adjuvant chemotherapy. Today is cycle #12 Abraxane. She came back from [facility] and had a very good time. She did not have much energy so she used a wheelchair to get around [facility]. She had some nausea over the past 2-3 days. So this has limited her food intake.  REVIEW OF SYSTEMS:   Constitutional: Denies fevers, chills or abnormal weight loss Eyes: Denies blurriness of vision Ears, nose, mouth, throat, and face: Denies mucositis or sore throat Respiratory: Denies cough, dyspnea or wheezes Cardiovascular: Denies palpitation, chest discomfort or lower extremity swelling Gastrointestinal:  Nausea and decreased appetite Skin: Denies abnormal skin rashes Lymphatics: Denies new lymphadenopathy or easy bruising Neurological:Denies numbness, tingling or new weaknesses Behavioral/Psych: Mood is stable, no new changes  All other systems were reviewed with the patient and are negative.  I have reviewed the past medical history, past surgical history, social history and family history with the patient and they are  unchanged from previous note.  ALLERGIES:  has No Known Allergies.  MEDICATIONS:  Current Outpatient Prescriptions  Medication Sig Dispense Refill  . acyclovir (ZOVIRAX) 400 MG tablet Take 1 tablet (400 mg total) by mouth 2 (two) times daily. 60 tablet 2  . allopurinol (ZYLOPRIM) 100 MG tablet TAKE ONE TABLET BY MOUTH TWICE DAILY 60 tablet 1  . furosemide (LASIX) 20 MG tablet Take 1 tablet (20 mg total) by mouth daily. 30 tablet 1  . LORazepam (ATIVAN) 0.5 MG tablet Take 1 tablet (0.5 mg total) by mouth every 6 (six) hours as needed (Nausea or vomiting). 30 tablet 0  . ondansetron (ZOFRAN) 8 MG tablet TAKE ONE TABLET BY MOUTH TWICE DAILY AS NEEDED START  ON  THE  THIRD  DAY  AFTER  CHEMOTHERAPY 30 tablet 0  . prochlorperazine (COMPAZINE) 10 MG tablet TAKE ONE TABLET BY MOUTH EVERY 6 HOURS AS NEEDED FOR NAUSEA AND VOMITING 30 tablet 0   No current facility-administered medications for this visit.   Facility-Administered Medications Ordered in Other Visits  Medication Dose Route Frequency Provider Last Rate Last Dose  . 0.9 %  sodium chloride infusion   Intravenous Once Nicholas Lose, MD      . heparin lock flush 100 unit/mL  500 Units Intracatheter Once PRN Nicholas Lose, MD      . PACLitaxel-protein bound (ABRAXANE) chemo infusion 150 mg  70 mg/m2 (Treatment Plan Actual) Intravenous Once Nicholas Lose, MD      . palonosetron (ALOXI) injection 0.25 mg  0.25 mg Intravenous Once Nicholas Lose, MD      . sodium chloride 0.9 % injection 10 mL  10 mL Intracatheter PRN Nicholas Lose, MD  PHYSICAL EXAMINATION: ECOG PERFORMANCE STATUS: 2 - Symptomatic, <50% confined to bed  Filed Vitals:   08/17/15 0913  BP: 144/71  Pulse: 78  Temp: 98.2 F (36.8 C)  Resp: 18   Filed Weights   08/17/15 0913  Weight: 214 lb 6.4 oz (97.251 kg)    GENERAL:alert, no distress and comfortable SKIN: skin color, texture, turgor are normal, no rashes or significant lesions EYES: normal, Conjunctiva are pink  and non-injected, sclera clear OROPHARYNX:no exudate, no erythema and lips, buccal mucosa, and tongue normal  NECK: supple, thyroid normal size, non-tender, without nodularity LYMPH:  no palpable lymphadenopathy in the cervical, axillary or inguinal LUNGS: clear to auscultation and percussion with normal breathing effort HEART: regular rate & rhythm and no murmurs and no lower extremity edema ABDOMEN:abdomen soft, non-tender and normal bowel sounds Musculoskeletal:no cyanosis of digits and no clubbing  NEURO: alert & oriented x 3 with fluent speech, no focal motor/sensory deficits  LABORATORY DATA:  I have reviewed the data as listed   Chemistry      Component Value Date/Time   NA 140 08/17/2015 0902   NA 133* 04/06/2015 0820   K 3.6 08/17/2015 0902   K 5.3* 04/06/2015 0820   CL 101 04/06/2015 0820   CO2 29 08/17/2015 0902   CO2 26 04/06/2015 0820   BUN 14.3 08/17/2015 0902   BUN 38* 04/06/2015 0820   CREATININE 1.0 08/17/2015 0902   CREATININE 1.30* 04/06/2015 0820      Component Value Date/Time   CALCIUM 10.0 08/17/2015 0902   CALCIUM 9.0 04/06/2015 0820   ALKPHOS 97 08/17/2015 0902   ALKPHOS 94 04/06/2015 0820   AST 22 08/17/2015 0902   AST 17 04/06/2015 0820   ALT 15 08/17/2015 0902   ALT 24 04/06/2015 0820   BILITOT 0.54 08/17/2015 0902   BILITOT 1.0 04/06/2015 0820       Lab Results  Component Value Date   WBC 6.2 08/17/2015   HGB 9.3* 08/17/2015   HCT 27.6* 08/17/2015   MCV 93.7 08/17/2015   PLT 378 08/17/2015   NEUTROABS 4.2 08/17/2015   ASSESSMENT & PLAN:  Breast cancer of upper-outer quadrant of left female breast Left breast invasive mammary cancer 1:00 position: 3.1 x 2.9 x 3.6 cm tumor with multiple enlarged level MCCXIII left axillary lymph nodes largest measuring 4 cm per total congruent nor mass measures 10.2 cm ER 12%, PR 0%, Ki-67 100%, HER-2 negative ratio 1.53 CT chest abdomen pelvis and bone scan 03/17/2015: No evidence of metastatic  disease Treatment plan: neoadjuvant chemotherapy with dose dense Adriamycin and Cytoxan followed by Abraxane weekly 12. Current treatment: Abraxane 12/12  Chemotherapy toxicities: 1. Severe neutropenia nadir counts: Decrease the dosage of chemotherapy for cycle 2 Adriamycin and Cytoxan 2. Thrush prescribed nystatin 3. Fatigue: Combination of chemotherapy, anemia 4. Intermittent nausea 5. Severe anemia 1 unit of packed red cells given. Today's hemoglobin 8.8 6. Dehydration: Encourage her to increase her fluid intake. We will give her 500 cc normal saline today. 6. Leg cramps: Most likely related to dehydration. Encouraged her to drink more fluids. 7. Hypotension: I asked her to discontinue lisinopril with hydrochlorothiazide. Once her blood pressure comes up we can resume her antihypertensive medication. 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 9. Nail changes:nails are looking like they're brittle andspoon-shaped 10. Hypokalemia: Encourage repeat potassium containing foods  I reduced the dosage of chemotherapy to 70 mg/m ( with cycle 7)  Plan: 1. Breast MRI scheduled for 08/18/2015 2.  Tumor board presentation next week 3. Follow-up 08/23/2015 to discuss the MRI results 4. Followed by surgery    No orders of the defined types were placed in this encounter.   The patient has a good understanding of the overall plan. she agrees with it. she will call with any problems that may develop before the next visit here.   Rulon Eisenmenger, MD

## 2015-08-17 NOTE — Assessment & Plan Note (Signed)
Left breast invasive mammary cancer 1:00 position: 3.1 x 2.9 x 3.6 cm tumor with multiple enlarged level MCCXIII left axillary lymph nodes largest measuring 4 cm per total congruent nor mass measures 10.2 cm ER 12%, PR 0%, Ki-67 100%, HER-2 negative ratio 1.53 CT chest abdomen pelvis and bone scan 03/17/2015: No evidence of metastatic disease Treatment plan: neoadjuvant chemotherapy with dose dense Adriamycin and Cytoxan followed by Abraxane weekly 12. Current treatment: Abraxane 12/12  Chemotherapy toxicities: 1. Severe neutropenia nadir counts: Decrease the dosage of chemotherapy for cycle 2 Adriamycin and Cytoxan 2. Thrush prescribed nystatin 3. Fatigue: Combination of chemotherapy, anemia 4. Intermittent nausea 5. Severe anemia 1 unit of packed red cells given. Today's hemoglobin 8.8 6. Dehydration: Encourage her to increase her fluid intake. We will give her 500 cc normal saline today. 6. Leg cramps: Most likely related to dehydration. Encouraged her to drink more fluids. 7. Hypotension: I asked her to discontinue lisinopril with hydrochlorothiazide. Once her blood pressure comes up we can resume her antihypertensive medication. 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 9. Nail changes:nails are looking like they're brittle andspoon-shaped 10. Hypokalemia: Encourage repeat potassium containing foods  I reduced the dosage of chemotherapy to 70 mg/m ( with cycle 7)  Plan: 1. Breast MRI scheduled for 08/18/2015 2. Tumor board presentation next week 3. Follow-up 08/23/2015 to discuss the MRI results 4. Followed by surgery

## 2015-08-18 ENCOUNTER — Ambulatory Visit
Admission: RE | Admit: 2015-08-18 | Discharge: 2015-08-18 | Disposition: A | Discharge status: Home / Self Care | Payer: Medicare Other | Source: Ambulatory Visit | Attending: Hematology and Oncology | Admitting: Hematology and Oncology

## 2015-08-18 DIAGNOSIS — C50412 Malignant neoplasm of upper-outer quadrant of left female breast: Secondary | ICD-10-CM

## 2015-08-18 MED ORDER — GADOBENATE DIMEGLUMINE 529 MG/ML IV SOLN
20.0000 mL | Freq: Once | INTRAVENOUS | Status: AC | PRN
  Administered 2015-08-18: 20 mL via INTRAVENOUS

## 2015-08-22 ENCOUNTER — Telehealth: Payer: Self-pay | Admitting: Hematology and Oncology

## 2015-08-22 ENCOUNTER — Other Ambulatory Visit: Payer: Self-pay | Admitting: *Deleted

## 2015-08-22 NOTE — Telephone Encounter (Signed)
Spoke with patient and she is aware of her cancelled lab  anne

## 2015-08-23 ENCOUNTER — Other Ambulatory Visit: Payer: Medicare Other

## 2015-08-23 ENCOUNTER — Encounter: Payer: Self-pay | Admitting: Hematology and Oncology

## 2015-08-23 ENCOUNTER — Ambulatory Visit (HOSPITAL_BASED_OUTPATIENT_CLINIC_OR_DEPARTMENT_OTHER): Payer: Medicare Other | Admitting: Hematology and Oncology

## 2015-08-23 VITALS — BP 141/81 | HR 82 | Temp 98.2°F | Resp 18 | Ht 65.0 in | Wt 214.1 lb

## 2015-08-23 DIAGNOSIS — C773 Secondary and unspecified malignant neoplasm of axilla and upper limb lymph nodes: Secondary | ICD-10-CM

## 2015-08-23 DIAGNOSIS — C50412 Malignant neoplasm of upper-outer quadrant of left female breast: Secondary | ICD-10-CM

## 2015-08-23 DIAGNOSIS — D649 Anemia, unspecified: Secondary | ICD-10-CM | POA: Diagnosis not present

## 2015-08-23 NOTE — Progress Notes (Signed)
Patient Care Team: Midge Minium, MD as PCP - General (Family Medicine) Mackie Pai, PA-C as Physician Assistant (Physician Assistant)  DIAGNOSIS: No matching staging information was found for the patient.  SUMMARY OF ONCOLOGIC HISTORY:   Breast cancer of upper-outer quadrant of left female breast   03/02/2015 Initial Diagnosis Left breast biopsy 1:00: Invasive mammary cancer, grade 3, 1 lymph node biopsy in the axilla positive, ER 12%, PR 0%, Ki-67 100%, HER-2 negative ratio 1.53   03/08/2015 Breast MRI Left breast 1:00 position: 3.6 cm mass with enlarged axillary lymph nodes multiple levels largest 4 cm, total mass measures 10.2 cm   03/30/2015 - 08/17/2015 Neo-Adjuvant Chemotherapy Adriamycin Cytoxan 4 followed by Abraxane weekly 12   08/21/2015 Breast MRI Complete imaging response to new adjuvant chemotherapy, no residual abnormal or asymmetric mass seen, lymph nodes completely resolved    CHIEF COMPLIANT: Follow-up of the breast MRI  INTERVAL HISTORY: Cassandra Maldonado is a 69 year old with above-mentioned history of left breast cancer with new adjuvant chemotherapy and is here to discuss the post chemotherapy breast MRI. She was presented this morning of the breast tumor board. She still feels slightly nauseated and weak.  REVIEW OF SYSTEMS:   Constitutional: Denies fevers, chills or abnormal weight loss Eyes: Denies blurriness of vision Ears, nose, mouth, throat, and face: Denies mucositis or sore throat Respiratory: Denies cough, dyspnea or wheezes Cardiovascular: Denies palpitation, chest discomfort or lower extremity swelling Gastrointestinal:  Complains of nausea Skin: Denies abnormal skin rashes Lymphatics: Denies new lymphadenopathy or easy bruising Neurological: Abnormal sensation like sand between the toes Behavioral/Psych: Mood is stable, no new changes  Breast:  denies any pain or lumps or nodules in either breasts All other systems were reviewed with the patient  and are negative. Nails: Brittle and discolored I have reviewed the past medical history, past surgical history, social history and family history with the patient and they are unchanged from previous note.  ALLERGIES:  has No Known Allergies.  MEDICATIONS:  Current Outpatient Prescriptions  Medication Sig Dispense Refill  . acyclovir (ZOVIRAX) 400 MG tablet Take 1 tablet (400 mg total) by mouth 2 (two) times daily. 60 tablet 2  . allopurinol (ZYLOPRIM) 100 MG tablet TAKE ONE TABLET BY MOUTH TWICE DAILY 60 tablet 1  . furosemide (LASIX) 20 MG tablet Take 1 tablet (20 mg total) by mouth daily. 30 tablet 1  . LORazepam (ATIVAN) 0.5 MG tablet Take 1 tablet (0.5 mg total) by mouth every 6 (six) hours as needed (Nausea or vomiting). 30 tablet 0  . ondansetron (ZOFRAN) 8 MG tablet TAKE ONE TABLET BY MOUTH TWICE DAILY AS NEEDED START  ON  THE  THIRD  DAY  AFTER  CHEMOTHERAPY 30 tablet 0  . prochlorperazine (COMPAZINE) 10 MG tablet TAKE ONE TABLET BY MOUTH EVERY 6 HOURS AS NEEDED FOR NAUSEA AND VOMITING 30 tablet 0   No current facility-administered medications for this visit.    PHYSICAL EXAMINATION: ECOG PERFORMANCE STATUS: 1 - Symptomatic but completely ambulatory  Filed Vitals:   08/23/15 0837  BP: 141/81  Pulse: 82  Temp: 98.2 F (36.8 C)  Resp: 18   Filed Weights   08/23/15 0837  Weight: 214 lb 1.6 oz (97.115 kg)    GENERAL:alert, no distress and comfortable SKIN: skin color, texture, turgor are normal, no rashes or significant lesions EYES: normal, Conjunctiva are pink and non-injected, sclera clear OROPHARYNX:no exudate, no erythema and lips, buccal mucosa, and tongue normal  NECK: supple, thyroid normal size, non-tender, without  nodularity LYMPH:  no palpable lymphadenopathy in the cervical, axillary or inguinal LUNGS: clear to auscultation and percussion with normal breathing effort HEART: regular rate & rhythm and no murmurs and no lower extremity edema ABDOMEN:abdomen  soft, non-tender and normal bowel sounds Musculoskeletal:no cyanosis of digits and no clubbing  NEURO: alert & oriented x 3 with fluent speech, grade 1 peripheral neuropathy  LABORATORY DATA:  I have reviewed the data as listed   Chemistry      Component Value Date/Time   NA 140 08/17/2015 0902   NA 133* 04/06/2015 0820   K 3.6 08/17/2015 0902   K 5.3* 04/06/2015 0820   CL 101 04/06/2015 0820   CO2 29 08/17/2015 0902   CO2 26 04/06/2015 0820   BUN 14.3 08/17/2015 0902   BUN 38* 04/06/2015 0820   CREATININE 1.0 08/17/2015 0902   CREATININE 1.30* 04/06/2015 0820      Component Value Date/Time   CALCIUM 10.0 08/17/2015 0902   CALCIUM 9.0 04/06/2015 0820   ALKPHOS 97 08/17/2015 0902   ALKPHOS 94 04/06/2015 0820   AST 22 08/17/2015 0902   AST 17 04/06/2015 0820   ALT 15 08/17/2015 0902   ALT 24 04/06/2015 0820   BILITOT 0.54 08/17/2015 0902   BILITOT 1.0 04/06/2015 0820       Lab Results  Component Value Date   WBC 6.2 08/17/2015   HGB 9.3* 08/17/2015   HCT 27.6* 08/17/2015   MCV 93.7 08/17/2015   PLT 378 08/17/2015   NEUTROABS 4.2 08/17/2015   ASSESSMENT & PLAN:  Breast cancer of upper-outer quadrant of left female breast Left breast invasive mammary cancer 1:00 position: 3.1 x 2.9 x 3.6 cm tumor with multiple enlarged level MCCXIII left axillary lymph nodes largest measuring 4 cm per total congruent nor mass measures 10.2 cm ER 12%, PR 0%, Ki-67 100%, HER-2 negative ratio 1.53 CT chest abdomen pelvis and bone scan 03/17/2015: No evidence of metastatic disease Chemotherapy summary: neoadjuvant chemotherapy with dose dense Adriamycin and Cytoxan followed by Abraxane weekly 12 completed 08/18/2015 Toxicities of chemotherapy include neutropenia, thrush, fatigue, nausea, anemia requiring 1 unit of PRBC, dehydration, leg cramps, hypertension, neuropathy grade 1, nail changes Breast MRI 08/21/2015: Complete imaging response to new adjuvant chemotherapy, no residual abnormal  or asymmetric mass seen, lymph nodes completely resolved  Radiology review: I discussed the final MRI report and showed her before and after pictures. Patient is ecstatic hearing these test results.  Recommendation: We presented her case in tumor board and the recommendations are 1. Breast conserving surgery 2. Followed by radiation 3. Followed by antiestrogen therapy with anastrozole x 5 years  Return to clinic after surgery discussed the final pathology report   No orders of the defined types were placed in this encounter.   The patient has a good understanding of the overall plan. she agrees with it. she will call with any problems that may develop before the next visit here.   Rulon Eisenmenger, MD

## 2015-08-23 NOTE — Assessment & Plan Note (Signed)
Left breast invasive mammary cancer 1:00 position: 3.1 x 2.9 x 3.6 cm tumor with multiple enlarged level MCCXIII left axillary lymph nodes largest measuring 4 cm per total congruent nor mass measures 10.2 cm ER 12%, PR 0%, Ki-67 100%, HER-2 negative ratio 1.53 CT chest abdomen pelvis and bone scan 03/17/2015: No evidence of metastatic disease Chemotherapy summary: neoadjuvant chemotherapy with dose dense Adriamycin and Cytoxan followed by Abraxane weekly 12 completed 08/18/2015 Toxicities of chemotherapy include neutropenia, thrush, fatigue, nausea, anemia requiring 1 unit of PRBC, dehydration, leg cramps, hypertension, neuropathy grade 1, nail changes Breast MRI 08/21/2015: Complete imaging response to new adjuvant chemotherapy, no residual abnormal or asymmetric mass seen, lymph nodes completely resolved  Radiology review: I discussed the final MRI report and showed her before and after pictures. Patient is ecstatic hearing these test results.  Recommendation: We presented her case in tumor board and the recommendations are 1. Breast conserving surgery 2. Followed by radiation  Return to clinic after surgery discussed the final pathology report

## 2015-08-25 ENCOUNTER — Ambulatory Visit: Payer: Self-pay | Admitting: Surgery

## 2015-08-25 DIAGNOSIS — C50912 Malignant neoplasm of unspecified site of left female breast: Secondary | ICD-10-CM

## 2015-08-25 NOTE — H&P (Signed)
Cassandra Maldonado 08/25/2015 9:25 AM Location: [facility] Patient #: [id number] DOB: 1946-11-18 Married / Language: English / Race: White Female History of Present Illness Cassandra Maldonado A. Francisco [name]; 08/25/2015 12:37 PM) Patient words: neoadjuvant therapy complete, discuss surgery         PT RETRNS TO DISCUSS SURGERY  SHE IS DOING WELL SHE HAS HAD A COMPLETE RESPONSE    Left breast invasive mammary cancer 1:00 position: 3.1 x 2.9 x 3.6 cm tumor with multiple enlarged level MCCXIII left axillary lymph nodes largest measuring 4 cm per total congruent nor mass measures 10.2 cm ER 12%, PR 0%, Ki-67 100%, HER-2 negative ratio 1.53 CT chest abdomen pelvis and bone scan 03/17/2015: No evidence of metastatic disease Chemotherapy summary: neoadjuvant chemotherapy with dose dense Adriamycin and Cytoxan followed by Abraxane weekly 12 completed 08/18/2015 Toxicities of chemotherapy include neutropenia, thrush, fatigue, nausea, anemia requiring 1 unit of PRBC, dehydration, leg cramps, hypertension, neuropathy grade 1, nail changes Breast MRI 08/21/2015: Complete imaging response to new adjuvant chemotherapy, no residual abnormal or asymmetric mass seen, lymph nodes completely resolved  Radiology review: I discussed the final MRI report and showed her before and after pictures. Patient is ecstatic hearing these test results.  Recommendation: We presented her case in tumor board and the recommendations are 1. Breast conserving surgery 2. Followed by radiation             CLINICAL DATA: Assess response neoadjuvant chemotherapy. 69 year old patient diagnosed in March 2016 with high-grade mammary carcinoma 1 o'clock position left breast and left axillary nodal metastasis, following ultrasound-guided biopsy of the left breast mass and one of several enlarged left axillary lymph nodes. LABS: None obtained EXAM: BILATERAL BREAST MRI WITH AND WITHOUT CONTRAST TECHNIQUE:  Multiplanar, multisequence MR images of both breasts were obtained prior to and following the intravenous administration of 20 ml of MultiHance. THREE-DIMENSIONAL MR IMAGE RENDERING ON INDEPENDENT WORKSTATION: Three-dimensional MR images were rendered by post-processing of the original MR data on an independent workstation. The three-dimensional MR images were interpreted, and findings are reported in the following complete MRI report for this study. Three dimensional images were evaluated at the independent DynaCad workstation COMPARISON: Mammogram and ultrasound images of 03/02/2015 and prior breast MRI dated 03/08/2015 FINDINGS: Breast composition: b. Scattered fibroglandular tissue. Background parenchymal enhancement: [place] Right breast: No mass or abnormal enhancement. Left breast: There as been a complete imaging response to neoadjuvant chemotherapy. No residual abnormal or asymmetric enhancement or mass is seen in the left breast. Biopsy clip is noted in the middle third of the left breast at 1 o'clock position. Lymph nodes: Interval resolution of enlarged left axillary lymph nodes. Left axillary lymph nodes are visible, but are now within normal limits for size and cortical thickness. No right axillary lymphadenopathy. No internal mammary chain lymphadenopathy. Ancillary findings: None. IMPRESSION: Significant improvement/complete imaging response following neoadjuvant chemotherapy. No residual mass or abnormal enhancement is seen in the left breast, and left axillary lymphadenopathy has resolved. RECOMMENDATION: Continue treatment planning BI-RADS CATEGORY 6: Known biopsy-proven malignancy. Electronically Signed By: Curlene Dolphin M.D. On: 08/21/2015 08:38 .  The patient is a 69 year old female   Problem List/Past Medical Cassandra Maldonado, [place]; 08/25/2015 9:29 AM) BREAST CANCER, LEFT (174.9  C50.912)  Other Problems Cassandra Maldonado, CMA; 08/25/2015 9:29 AM) Back  Pain Breast Cancer Cholelithiasis Hemorrhoids High blood pressure Lump In Breast  Past Surgical History Cassandra Maldonado, CMA; 08/25/2015 9:29 AM) Breast Biopsy Left. Cesarean Section - Multiple Hemorrhoidectomy Hysterectomy (  not due to cancer) - Partial  Diagnostic Studies History Cassandra Maldonado, [place]; 08/25/2015 9:29 AM) Colonoscopy never Mammogram within last year Pap Smear >5 years ago  Allergies Cassandra Maldonado, CMA; 08/25/2015 9:29 AM) No Known Drug Allergies 03/06/2015  Medication History Cassandra Maldonado, [place]; 08/25/2015 9:29 AM) Allopurinol (100MG Tablet, Oral) Active. Lisinopril-Hydrochlorothiazide (10-12.5MG Tablet, Oral) Active. Aspirin (81MG Tablet, Oral) Active. Medications Reconciled  Social History Cassandra Maldonado, [place]; 08/25/2015 9:29 AM) Caffeine use Carbonated beverages, Coffee. No alcohol use No drug use Tobacco use Never smoker.  Family History Cassandra Maldonado, [place]; 08/25/2015 9:29 AM) Cancer Mother, Sister. Malignant Neoplasm Of Pancreas Father.  Pregnancy / Birth History Cassandra Maldonado, [place]; 08/25/2015 9:29 AM) Age at menarche 56 years. Age of menopause >49 Gravida 54 Maternal age 67-35 Para 2    Vitals [name] CMA; 08/25/2015 9:28 AM) 08/25/2015 9:28 AM Weight: 213.38 lb Height: 65in Body Surface Area: 2.11 m Body Mass Index: 35.51 kg/m Pulse: 76 (Regular)  BP: 138/86 (Sitting, Left Arm, Standard)     Physical Exam (Josemanuel Eakins A. Taneeka Curtner MD; 08/25/2015 12:38 PM)  General Mental Status-Alert. General Appearance-Consistent with stated age. Hydration-Well hydrated. Voice-Normal.  Head and Neck Head-normocephalic, atraumatic with no lesions or palpable masses. Trachea-midline. Thyroid Gland Characteristics - normal size and consistency.  Eye Eyeball - Bilateral-Extraocular movements intact. Sclera/Conjunctiva - Bilateral-No scleral icterus.  Chest and Lung  Exam Chest and lung exam reveals -quiet, even and easy respiratory effort with no use of accessory muscles and on auscultation, normal breath sounds, no adventitious sounds and normal vocal resonance. Inspection Chest Wall - Normal. Back - normal.  Breast Breast - Left-Symmetric, Non Tender, No Biopsy scars, no Dimpling, No Inflammation, No Lumpectomy scars, No Mastectomy scars, No Peau d' Orange. Breast - Right-Symmetric, Non Tender, No Biopsy scars, no Dimpling, No Inflammation, No Lumpectomy scars, No Mastectomy scars, No Peau d' Orange. Breast Lump-No Palpable Breast Mass.  Cardiovascular Cardiovascular examination reveals -normal heart sounds, regular rate and rhythm with no murmurs and normal pedal pulses bilaterally.  Abdomen Inspection Inspection of the abdomen reveals - No Hernias. Skin - Scar - no surgical scars. Palpation/Percussion Palpation and Percussion of the abdomen reveal - Soft, Non Tender, No Rebound tenderness, No Rigidity (guarding) and No hepatosplenomegaly. Auscultation Auscultation of the abdomen reveals - Bowel sounds normal.  Neurologic Neurologic evaluation reveals -alert and oriented x 3 with no impairment of recent or remote memory. Mental Status-Normal.  Musculoskeletal Normal Exam - Left-Upper Extremity Strength Normal and Lower Extremity Strength Normal. Normal Exam - Right-Upper Extremity Strength Normal and Lower Extremity Strength Normal.  Lymphatic Head & Neck  General Head & Neck Lymphatics: Bilateral - Description - Normal. Axillary  General Axillary Region: Bilateral - Description - Normal. Tenderness - Non Tender. Femoral & Inguinal  Generalized Femoral & Inguinal Lymphatics: Bilateral - Description - Normal. Tenderness - Non Tender.    Assessment & Plan (Olubunmi Rothenberger A. Larua Collier MD; 08/25/2015 12:38 PM)  BREAST CANCER, LEFT (174.9  C50.912) Impression: complete radiological response. Candidate for left partial mastectomy  and SLN mapping/ ALND. May be candidate for Alliance trial. Will double check with coordinator. Discussed standard of care and ALND vs SLN mapping. Pt understands difference. She is ok with SLN mapping after this discussion. Remove port. Risk of lumpectomy include bleeding, infection, seroma, more surgery, use of seed/wire, wound care, cosmetic deformity and the need for other treatments, death , blood clots, death. Pt agrees to proceed. Risk of sentinel lymph node mapping  include bleeding, infection, lymphedema, shoulder pain. stiffness, dye allergy. cosmetic deformity , blood clots, death, need for more surgery. Pt agres to proceed.  Current Plans Pt Education - Overview of the treatment of newly diagnosed, non-metastatic breast cancer: discussed with patient and provided information. Pt Education - [facility] Breast Biopsy HCI: discussed with patient and provided information. Pt Education - flb breast cancer surgery: discussed with patient and provided information.   The anatomy and the physiology was discussed. The pathophysiology and natural history of the disease was discussed. Options were discussed and recommendations were made. Technique, risks, benefits, & alternatives were discussed. Risks such as stroke, heart attack, bleeding, indection, death, and other risks discussed. Questions answered. The patient agrees to proceed.

## 2015-08-29 ENCOUNTER — Other Ambulatory Visit: Payer: Self-pay | Admitting: Surgery

## 2015-08-29 DIAGNOSIS — C50912 Malignant neoplasm of unspecified site of left female breast: Secondary | ICD-10-CM

## 2015-08-31 ENCOUNTER — Telehealth: Payer: Self-pay | Admitting: Hematology and Oncology

## 2015-08-31 ENCOUNTER — Other Ambulatory Visit: Payer: Self-pay | Admitting: *Deleted

## 2015-08-31 NOTE — Telephone Encounter (Signed)
Appointments made and patient called with surgical follow up

## 2015-09-01 ENCOUNTER — Other Ambulatory Visit: Payer: Self-pay | Admitting: Hematology and Oncology

## 2015-09-01 ENCOUNTER — Encounter (HOSPITAL_BASED_OUTPATIENT_CLINIC_OR_DEPARTMENT_OTHER): Payer: Self-pay | Admitting: *Deleted

## 2015-09-06 ENCOUNTER — Encounter: Payer: Self-pay | Admitting: Hematology and Oncology

## 2015-09-06 NOTE — Progress Notes (Signed)
I placed prior auth form for lorazepam on desk of nurse for dr. Lindi Adie

## 2015-09-07 ENCOUNTER — Encounter (HOSPITAL_BASED_OUTPATIENT_CLINIC_OR_DEPARTMENT_OTHER)
Admission: RE | Admit: 2015-09-07 | Discharge: 2015-09-07 | Disposition: A | Discharge status: Home / Self Care | Payer: Medicare Other | Source: Ambulatory Visit | Attending: Surgery | Admitting: Surgery

## 2015-09-07 DIAGNOSIS — C50412 Malignant neoplasm of upper-outer quadrant of left female breast: Secondary | ICD-10-CM | POA: Diagnosis not present

## 2015-09-07 DIAGNOSIS — Z170 Estrogen receptor positive status [ER+]: Secondary | ICD-10-CM | POA: Diagnosis not present

## 2015-09-07 DIAGNOSIS — Z7982 Long term (current) use of aspirin: Secondary | ICD-10-CM | POA: Diagnosis not present

## 2015-09-07 DIAGNOSIS — Z452 Encounter for adjustment and management of vascular access device: Secondary | ICD-10-CM | POA: Diagnosis not present

## 2015-09-07 DIAGNOSIS — I10 Essential (primary) hypertension: Secondary | ICD-10-CM | POA: Diagnosis not present

## 2015-09-07 DIAGNOSIS — Z9221 Personal history of antineoplastic chemotherapy: Secondary | ICD-10-CM | POA: Diagnosis not present

## 2015-09-07 DIAGNOSIS — C773 Secondary and unspecified malignant neoplasm of axilla and upper limb lymph nodes: Secondary | ICD-10-CM | POA: Diagnosis not present

## 2015-09-07 DIAGNOSIS — Z79899 Other long term (current) drug therapy: Secondary | ICD-10-CM | POA: Diagnosis not present

## 2015-09-07 DIAGNOSIS — C50912 Malignant neoplasm of unspecified site of left female breast: Secondary | ICD-10-CM | POA: Diagnosis present

## 2015-09-07 DIAGNOSIS — Z6835 Body mass index (BMI) 35.0-35.9, adult: Secondary | ICD-10-CM | POA: Diagnosis not present

## 2015-09-07 LAB — CBC WITH DIFFERENTIAL/PLATELET
Basophils Absolute: 0 10*3/uL (ref 0.0–0.1)
Basophils Relative: 0 %
EOS PCT: 1 %
Eosinophils Absolute: 0.1 10*3/uL (ref 0.0–0.7)
HEMATOCRIT: 30 % — AB (ref 36.0–46.0)
Hemoglobin: 9.6 g/dL — ABNORMAL LOW (ref 12.0–15.0)
LYMPHS PCT: 27 %
Lymphs Abs: 2.1 10*3/uL (ref 0.7–4.0)
MCH: 30.1 pg (ref 26.0–34.0)
MCHC: 32 g/dL (ref 30.0–36.0)
MCV: 94 fL (ref 78.0–100.0)
MONO ABS: 0.6 10*3/uL (ref 0.1–1.0)
MONOS PCT: 8 %
NEUTROS ABS: 5 10*3/uL (ref 1.7–7.7)
Neutrophils Relative %: 64 %
Platelets: 345 10*3/uL (ref 150–400)
RBC: 3.19 MIL/uL — ABNORMAL LOW (ref 3.87–5.11)
RDW: 14.6 % (ref 11.5–15.5)
WBC: 7.9 10*3/uL (ref 4.0–10.5)

## 2015-09-07 LAB — COMPREHENSIVE METABOLIC PANEL
ALT: 12 U/L — ABNORMAL LOW (ref 14–54)
ANION GAP: 8 (ref 5–15)
AST: 17 U/L (ref 15–41)
Albumin: 3.6 g/dL (ref 3.5–5.0)
Alkaline Phosphatase: 76 U/L (ref 38–126)
BILIRUBIN TOTAL: 0.6 mg/dL (ref 0.3–1.2)
BUN: 15 mg/dL (ref 6–20)
CO2: 29 mmol/L (ref 22–32)
Calcium: 9.6 mg/dL (ref 8.9–10.3)
Chloride: 100 mmol/L — ABNORMAL LOW (ref 101–111)
Creatinine, Ser: 1.08 mg/dL — ABNORMAL HIGH (ref 0.44–1.00)
GFR, EST NON AFRICAN AMERICAN: 52 mL/min — AB (ref >60)
Glucose, Bld: 113 mg/dL — ABNORMAL HIGH (ref 65–99)
POTASSIUM: 3.7 mmol/L (ref 3.5–5.1)
Sodium: 137 mmol/L (ref 135–145)
TOTAL PROTEIN: 6.8 g/dL (ref 6.5–8.1)

## 2015-09-11 ENCOUNTER — Ambulatory Visit
Admission: RE | Admit: 2015-09-11 | Discharge: 2015-09-11 | Disposition: A | Discharge status: Home / Self Care | Payer: Medicare Other | Source: Ambulatory Visit | Attending: Surgery | Admitting: Surgery

## 2015-09-11 DIAGNOSIS — C50912 Malignant neoplasm of unspecified site of left female breast: Secondary | ICD-10-CM

## 2015-09-12 ENCOUNTER — Encounter (HOSPITAL_BASED_OUTPATIENT_CLINIC_OR_DEPARTMENT_OTHER): Payer: Self-pay | Admitting: Anesthesiology

## 2015-09-12 ENCOUNTER — Encounter (HOSPITAL_BASED_OUTPATIENT_CLINIC_OR_DEPARTMENT_OTHER)
Admission: RE | Disposition: A | Discharge status: Home / Self Care | Payer: Self-pay | Source: Ambulatory Visit | Attending: Surgery

## 2015-09-12 ENCOUNTER — Ambulatory Visit (HOSPITAL_BASED_OUTPATIENT_CLINIC_OR_DEPARTMENT_OTHER): Payer: Medicare Other | Admitting: Anesthesiology

## 2015-09-12 ENCOUNTER — Ambulatory Visit (HOSPITAL_BASED_OUTPATIENT_CLINIC_OR_DEPARTMENT_OTHER)
Admission: RE | Admit: 2015-09-12 | Discharge: 2015-09-12 | Disposition: A | Discharge status: Home / Self Care | Payer: Medicare Other | Source: Ambulatory Visit | Attending: Surgery | Admitting: Surgery

## 2015-09-12 ENCOUNTER — Ambulatory Visit
Admission: RE | Admit: 2015-09-12 | Discharge: 2015-09-12 | Disposition: A | Discharge status: Home / Self Care | Payer: Medicare Other | Source: Ambulatory Visit | Attending: Surgery | Admitting: Surgery

## 2015-09-12 ENCOUNTER — Ambulatory Visit (HOSPITAL_COMMUNITY)
Admission: RE | Admit: 2015-09-12 | Discharge: 2015-09-12 | Disposition: A | Discharge status: Home / Self Care | Payer: Medicare Other | Source: Ambulatory Visit | Attending: Surgery | Admitting: Surgery

## 2015-09-12 DIAGNOSIS — Z6835 Body mass index (BMI) 35.0-35.9, adult: Secondary | ICD-10-CM | POA: Insufficient documentation

## 2015-09-12 DIAGNOSIS — C773 Secondary and unspecified malignant neoplasm of axilla and upper limb lymph nodes: Secondary | ICD-10-CM | POA: Insufficient documentation

## 2015-09-12 DIAGNOSIS — Z7982 Long term (current) use of aspirin: Secondary | ICD-10-CM | POA: Insufficient documentation

## 2015-09-12 DIAGNOSIS — Z452 Encounter for adjustment and management of vascular access device: Secondary | ICD-10-CM | POA: Insufficient documentation

## 2015-09-12 DIAGNOSIS — C50912 Malignant neoplasm of unspecified site of left female breast: Secondary | ICD-10-CM

## 2015-09-12 DIAGNOSIS — Z170 Estrogen receptor positive status [ER+]: Secondary | ICD-10-CM | POA: Diagnosis not present

## 2015-09-12 DIAGNOSIS — C50412 Malignant neoplasm of upper-outer quadrant of left female breast: Secondary | ICD-10-CM | POA: Diagnosis not present

## 2015-09-12 DIAGNOSIS — Z9221 Personal history of antineoplastic chemotherapy: Secondary | ICD-10-CM | POA: Insufficient documentation

## 2015-09-12 DIAGNOSIS — Z79899 Other long term (current) drug therapy: Secondary | ICD-10-CM | POA: Insufficient documentation

## 2015-09-12 DIAGNOSIS — I10 Essential (primary) hypertension: Secondary | ICD-10-CM | POA: Insufficient documentation

## 2015-09-12 HISTORY — PX: PORT-A-CATH REMOVAL: SHX5289

## 2015-09-12 HISTORY — PX: RADIOACTIVE SEED GUIDED PARTIAL MASTECTOMY WITH AXILLARY SENTINEL LYMPH NODE BIOPSY: SHX6520

## 2015-09-12 SURGERY — RADIOACTIVE SEED GUIDED PARTIAL MASTECTOMY WITH AXILLARY SENTINEL LYMPH NODE BIOPSY
Anesthesia: Regional | Site: Chest | Laterality: Right

## 2015-09-12 MED ORDER — CHLORHEXIDINE GLUCONATE 4 % EX LIQD: 1.0000 "application " | Freq: Once | CUTANEOUS | Status: DC

## 2015-09-12 MED ORDER — ONDANSETRON HCL 4 MG/2ML IJ SOLN
INTRAMUSCULAR | Status: DC | PRN
  Administered 2015-09-12: 4 mg via INTRAVENOUS

## 2015-09-12 MED ORDER — LIDOCAINE HCL (CARDIAC) 20 MG/ML IV SOLN
INTRAVENOUS | Status: DC | PRN
  Administered 2015-09-12: 75 mg via INTRAVENOUS

## 2015-09-12 MED ORDER — CEFAZOLIN SODIUM 1-5 GM-% IV SOLN
INTRAVENOUS | Status: AC
  Filled 2015-09-12: qty 50

## 2015-09-12 MED ORDER — HYDROMORPHONE HCL 1 MG/ML IJ SOLN
0.2500 mg | INTRAMUSCULAR | Status: DC | PRN
  Administered 2015-09-12 (×2): 0.5 mg via INTRAVENOUS

## 2015-09-12 MED ORDER — METHYLENE BLUE 1 % INJ SOLN
INTRAMUSCULAR | Status: AC
  Filled 2015-09-12: qty 10

## 2015-09-12 MED ORDER — SODIUM CHLORIDE 0.9 % IJ SOLN
INTRAMUSCULAR | Status: AC
  Filled 2015-09-12: qty 10

## 2015-09-12 MED ORDER — PROPOFOL 10 MG/ML IV BOLUS
INTRAVENOUS | Status: DC | PRN
  Administered 2015-09-12: 200 mg via INTRAVENOUS

## 2015-09-12 MED ORDER — BUPIVACAINE-EPINEPHRINE (PF) 0.25% -1:200000 IJ SOLN
INTRAMUSCULAR | Status: AC
  Filled 2015-09-12: qty 30

## 2015-09-12 MED ORDER — MIDAZOLAM HCL 2 MG/2ML IJ SOLN
1.0000 mg | INTRAMUSCULAR | Status: DC | PRN
  Administered 2015-09-12 (×2): 2 mg via INTRAVENOUS

## 2015-09-12 MED ORDER — TECHNETIUM TC 99M SULFUR COLLOID FILTERED
1.0000 | Freq: Once | INTRAVENOUS | Status: AC | PRN
  Administered 2015-09-12: 1 via INTRADERMAL

## 2015-09-12 MED ORDER — EPHEDRINE SULFATE 50 MG/ML IJ SOLN
INTRAMUSCULAR | Status: DC | PRN
Start: 2015-09-12 — End: 2015-09-12
  Administered 2015-09-12 (×2): 10 mg via INTRAVENOUS

## 2015-09-12 MED ORDER — BUPIVACAINE-EPINEPHRINE (PF) 0.5% -1:200000 IJ SOLN
INTRAMUSCULAR | Status: DC | PRN
  Administered 2015-09-12: 25 mL via PERINEURAL

## 2015-09-12 MED ORDER — OXYCODONE-ACETAMINOPHEN 5-325 MG PO TABS: 1.0000 | ORAL_TABLET | ORAL | Status: DC | PRN

## 2015-09-12 MED ORDER — DEXAMETHASONE SODIUM PHOSPHATE 4 MG/ML IJ SOLN
INTRAMUSCULAR | Status: DC | PRN
  Administered 2015-09-12: 10 mg via INTRAVENOUS

## 2015-09-12 MED ORDER — FENTANYL CITRATE (PF) 100 MCG/2ML IJ SOLN
50.0000 ug | INTRAMUSCULAR | Status: DC | PRN
  Administered 2015-09-12 (×2): 100 ug via INTRAVENOUS

## 2015-09-12 MED ORDER — MIDAZOLAM HCL 2 MG/2ML IJ SOLN
INTRAMUSCULAR | Status: AC
  Filled 2015-09-12: qty 2

## 2015-09-12 MED ORDER — LIDOCAINE HCL (CARDIAC) 20 MG/ML IV SOLN
INTRAVENOUS | Status: AC
  Filled 2015-09-12: qty 5

## 2015-09-12 MED ORDER — GLYCOPYRROLATE 0.2 MG/ML IJ SOLN: 0.2000 mg | Freq: Once | INTRAMUSCULAR | Status: DC | PRN

## 2015-09-12 MED ORDER — LACTATED RINGERS IV SOLN
INTRAVENOUS | Status: DC
  Administered 2015-09-12 (×2): via INTRAVENOUS

## 2015-09-12 MED ORDER — SODIUM CHLORIDE 0.9 % IJ SOLN
INTRAMUSCULAR | Status: DC | PRN
  Administered 2015-09-12: 5 mL via INTRADERMAL

## 2015-09-12 MED ORDER — ONDANSETRON HCL 4 MG/2ML IJ SOLN
INTRAMUSCULAR | Status: AC
  Filled 2015-09-12: qty 2

## 2015-09-12 MED ORDER — OXYCODONE HCL 5 MG PO TABS: 5.0000 mg | ORAL_TABLET | Freq: Once | ORAL | Status: DC | PRN

## 2015-09-12 MED ORDER — FENTANYL CITRATE (PF) 100 MCG/2ML IJ SOLN
INTRAMUSCULAR | Status: AC
  Filled 2015-09-12: qty 4

## 2015-09-12 MED ORDER — BUPIVACAINE-EPINEPHRINE 0.25% -1:200000 IJ SOLN
INTRAMUSCULAR | Status: DC | PRN
  Administered 2015-09-12: 4 mL

## 2015-09-12 MED ORDER — SCOPOLAMINE 1 MG/3DAYS TD PT72: 1.0000 | MEDICATED_PATCH | Freq: Once | TRANSDERMAL | Status: DC | PRN

## 2015-09-12 MED ORDER — OXYCODONE HCL 5 MG/5ML PO SOLN: 5.0000 mg | Freq: Once | ORAL | Status: DC | PRN

## 2015-09-12 MED ORDER — FENTANYL CITRATE (PF) 100 MCG/2ML IJ SOLN
INTRAMUSCULAR | Status: AC
  Filled 2015-09-12: qty 2

## 2015-09-12 MED ORDER — CEFAZOLIN SODIUM-DEXTROSE 2-3 GM-% IV SOLR
INTRAVENOUS | Status: AC
  Filled 2015-09-12: qty 50

## 2015-09-12 MED ORDER — MEPERIDINE HCL 25 MG/ML IJ SOLN: 6.2500 mg | INTRAMUSCULAR | Status: DC | PRN

## 2015-09-12 MED ORDER — CEFAZOLIN SODIUM 10 G IJ SOLR
3.0000 g | INTRAMUSCULAR | Status: AC
  Administered 2015-09-12: 2 g via INTRAVENOUS

## 2015-09-12 MED ORDER — MIDAZOLAM HCL 2 MG/2ML IJ SOLN
INTRAMUSCULAR | Status: AC
  Filled 2015-09-12: qty 4

## 2015-09-12 MED ORDER — DEXAMETHASONE SODIUM PHOSPHATE 10 MG/ML IJ SOLN
INTRAMUSCULAR | Status: AC
  Filled 2015-09-12: qty 1

## 2015-09-12 MED ORDER — HYDROMORPHONE HCL 1 MG/ML IJ SOLN
INTRAMUSCULAR | Status: AC
  Filled 2015-09-12: qty 1

## 2015-09-12 MED ORDER — HYDROCODONE-ACETAMINOPHEN 5-325 MG PO TABS: 1.0000 | ORAL_TABLET | Freq: Four times a day (QID) | ORAL | Status: DC | PRN

## 2015-09-12 SURGICAL SUPPLY — 49 items
APPLIER CLIP 9.375 MED OPEN (MISCELLANEOUS) ×3
BENZOIN TINCTURE PRP APPL 2/3 (GAUZE/BANDAGES/DRESSINGS) IMPLANT
BINDER BREAST LRG (GAUZE/BANDAGES/DRESSINGS) IMPLANT
BINDER BREAST MEDIUM (GAUZE/BANDAGES/DRESSINGS) IMPLANT
BINDER BREAST XLRG (GAUZE/BANDAGES/DRESSINGS) ×3 IMPLANT
BINDER BREAST XXLRG (GAUZE/BANDAGES/DRESSINGS) IMPLANT
BLADE SURG 15 STRL LF DISP TIS (BLADE) ×2 IMPLANT
BLADE SURG 15 STRL SS (BLADE) ×1
CANISTER SUCT 1200ML W/VALVE (MISCELLANEOUS) ×3 IMPLANT
CHLORAPREP W/TINT 26ML (MISCELLANEOUS) ×3 IMPLANT
CLIP APPLIE 9.375 MED OPEN (MISCELLANEOUS) ×2 IMPLANT
COVER BACK TABLE 60X90IN (DRAPES) ×3 IMPLANT
COVER MAYO STAND STRL (DRAPES) ×3 IMPLANT
COVER PROBE W GEL 5X96 (DRAPES) ×3 IMPLANT
DECANTER SPIKE VIAL GLASS SM (MISCELLANEOUS) ×3 IMPLANT
DRAPE LAPAROSCOPIC ABDOMINAL (DRAPES) ×3 IMPLANT
DRAPE LAPAROTOMY 100X72 PEDS (DRAPES) ×3 IMPLANT
DRAPE UTILITY XL STRL (DRAPES) ×3 IMPLANT
ELECT COATED BLADE 2.86 ST (ELECTRODE) ×3 IMPLANT
ELECT REM PT RETURN 9FT ADLT (ELECTROSURGICAL) ×3
ELECTRODE REM PT RTRN 9FT ADLT (ELECTROSURGICAL) ×2 IMPLANT
GLOVE BIO SURGEON STRL SZ 6.5 (GLOVE) ×3 IMPLANT
GLOVE BIOGEL PI IND STRL 7.0 (GLOVE) ×4 IMPLANT
GLOVE BIOGEL PI IND STRL 8 (GLOVE) ×2 IMPLANT
GLOVE BIOGEL PI INDICATOR 7.0 (GLOVE) ×2
GLOVE BIOGEL PI INDICATOR 8 (GLOVE) ×1
GLOVE ECLIPSE 8.0 STRL XLNG CF (GLOVE) ×3 IMPLANT
GOWN STRL REUS W/ TWL LRG LVL3 (GOWN DISPOSABLE) ×4 IMPLANT
GOWN STRL REUS W/TWL LRG LVL3 (GOWN DISPOSABLE) ×2
HEMOSTAT SNOW SURGICEL 2X4 (HEMOSTASIS) ×3 IMPLANT
KIT MARKER MARGIN INK (KITS) ×3 IMPLANT
LIQUID BAND (GAUZE/BANDAGES/DRESSINGS) ×3 IMPLANT
NDL SAFETY ECLIPSE 18X1.5 (NEEDLE) ×2 IMPLANT
NEEDLE HYPO 18GX1.5 SHARP (NEEDLE) ×1
NEEDLE HYPO 25X1 1.5 SAFETY (NEEDLE) ×6 IMPLANT
NS IRRIG 1000ML POUR BTL (IV SOLUTION) ×3 IMPLANT
PACK BASIN DAY SURGERY FS (CUSTOM PROCEDURE TRAY) ×3 IMPLANT
PENCIL BUTTON HOLSTER BLD 10FT (ELECTRODE) ×3 IMPLANT
SLEEVE SCD COMPRESS KNEE MED (MISCELLANEOUS) ×3 IMPLANT
SPONGE LAP 4X18 X RAY DECT (DISPOSABLE) ×3 IMPLANT
STRIP CLOSURE SKIN 1/2X4 (GAUZE/BANDAGES/DRESSINGS) IMPLANT
SUT MNCRL AB 4-0 PS2 18 (SUTURE) ×3 IMPLANT
SUT MON AB 4-0 PC3 18 (SUTURE) ×3 IMPLANT
SUT VICRYL 3-0 CR8 SH (SUTURE) ×3 IMPLANT
SYR CONTROL 10ML LL (SYRINGE) ×6 IMPLANT
TOWEL OR 17X24 6PK STRL BLUE (TOWEL DISPOSABLE) ×3 IMPLANT
TOWEL OR NON WOVEN STRL DISP B (DISPOSABLE) ×3 IMPLANT
TUBE CONNECTING 20X1/4 (TUBING) ×3 IMPLANT
YANKAUER SUCT BULB TIP NO VENT (SUCTIONS) ×3 IMPLANT

## 2015-09-12 NOTE — Anesthesia Postprocedure Evaluation (Signed)
  Anesthesia Post-op Note  Patient: Cassandra Maldonado  Procedure(s) Performed: Procedure(s): LEFT BREAST RADIOACTIVE SEED LOCALIZED PARTILA MASTECTOMY WITH SENTINEL LYMPH NODE MAPPING AND PORT REMOVAL (Left) REMOVAL PORT-A-CATH (Right)  Patient Location: PACU  Anesthesia Type:General and Regional  Level of Consciousness: awake  Airway and Oxygen Therapy: Patient Spontanous Breathing  Post-op Pain: none  Post-op Assessment: Post-op Vital signs reviewed, Patient's Cardiovascular Status Stable, Respiratory Function Stable, Patent Airway, No signs of Nausea or vomiting and Pain level controlled              Post-op Vital Signs: Reviewed and stable  Last Vitals:  Filed Vitals:   09/12/15 1512  BP:   Pulse: 88  Temp:   Resp: 19    Complications: No apparent anesthesia complications

## 2015-09-12 NOTE — Discharge Instructions (Signed)
[facility] °Office Phone Number [phone] ° °BREAST BIOPSY/ PARTIAL MASTECTOMY: POST OP INSTRUCTIONS ° °Always review your discharge instruction sheet given to you by the facility where your surgery was performed. ° °IF YOU HAVE DISABILITY OR FAMILY LEAVE FORMS, YOU MUST BRING THEM TO THE OFFICE FOR PROCESSING.  DO NOT GIVE THEM TO YOUR DOCTOR. ° °1. A prescription for pain medication may be given to you upon discharge.  Take your pain medication as prescribed, if needed.  If narcotic pain medicine is not needed, then you may take acetaminophen (Tylenol) or ibuprofen (Advil) as needed. °2. Take your usually prescribed medications unless otherwise directed °3. If you need a refill on your pain medication, please contact your pharmacy.  They will contact our office to request authorization.  Prescriptions will not be filled after 5pm or on week-ends. °4. You should eat very light the first 24 hours after surgery, such as soup, crackers, pudding, etc.  Resume your normal diet the day after surgery. °5. Most patients will experience some swelling and bruising in the breast.  Ice packs and a good support bra will help.  Swelling and bruising can take several days to resolve.  °6. It is common to experience some constipation if taking pain medication after surgery.  Increasing fluid intake and taking a stool softener will usually help or prevent this problem from occurring.  A mild laxative (Milk of Magnesia or Miralax) should be taken according to package directions if there are no bowel movements after 48 hours. °7. Unless discharge instructions indicate otherwise, you may remove your bandages 24-48 hours after surgery, and you may shower at that time.  You may have steri-strips (small skin tapes) in place directly over the incision.  These strips should be left on the skin for 7-10 days.  If your surgeon used skin glue on the incision, you may shower in 24 hours.  The glue will flake off over the  next 2-3 weeks.  Any sutures or staples will be removed at the office during your follow-up visit. °8. ACTIVITIES:  You may resume regular daily activities (gradually increasing) beginning the next day.  Wearing a good support bra or sports bra minimizes pain and swelling.  You may have sexual intercourse when it is comfortable. °a. You may drive when you no longer are taking prescription pain medication, you can comfortably wear a seatbelt, and you can safely maneuver your car and apply brakes. °b. RETURN TO WORK:  ______________________________________________________________________________________ °9. You should see your doctor in the office for a follow-up appointment approximately two weeks after your surgery.  Your doctor’s nurse will typically make your follow-up appointment when she calls you with your pathology report.  Expect your pathology report 2-3 business days after your surgery.  You may call to check if you do not hear from us after three days. °10. OTHER INSTRUCTIONS: _______________________________________________________________________________________________ _____________________________________________________________________________________________________________________________________ °_____________________________________________________________________________________________________________________________________ °_____________________________________________________________________________________________________________________________________ ° °WHEN TO CALL YOUR DOCTOR: °1. Fever over 101.0 °2. Nausea and/or vomiting. °3. Extreme swelling or bruising. °4. Continued bleeding from incision. °5. Increased pain, redness, or drainage from the incision. ° °The clinic staff is available to answer your questions during regular business hours.  Please don’t hesitate to call and ask to speak to one of the nurses for clinical concerns.  If you have a medical emergency, go to the nearest  emergency room or call 911.  A surgeon from [facility] is always on call at the hospital. ° °For further questions, please visit [email or web address]  ° ° ° °  Post Anesthesia Home Care Instructions ° °Activity: °Get plenty of rest for the remainder of the day. A responsible adult should stay with you for 24 hours following the procedure.  °For the next 24 hours, DO NOT: °-Drive a car °-Operate machinery °-Drink alcoholic beverages °-Take any medication unless instructed by your physician °-Make any legal decisions or sign important papers. ° °Meals: °Start with liquid foods such as gelatin or soup. Progress to regular foods as tolerated. Avoid greasy, spicy, heavy foods. If nausea and/or vomiting occur, drink only clear liquids until the nausea and/or vomiting subsides. Call your physician if vomiting continues. ° °Special Instructions/Symptoms: °Your throat may feel dry or sore from the anesthesia or the breathing tube placed in your throat during surgery. If this causes discomfort, gargle with warm salt water. The discomfort should disappear within 24 hours. ° °If you had a scopolamine patch placed behind your ear for the management of post- operative nausea and/or vomiting: ° °1. The medication in the patch is effective for 72 hours, after which it should be removed.  Wrap patch in a tissue and discard in the trash. Wash hands thoroughly with soap and water. °2. You may remove the patch earlier than 72 hours if you experience unpleasant side effects which may include dry mouth, dizziness or visual disturbances. °3. Avoid touching the patch. Wash your hands with soap and water after contact with the patch. ° ° °Call your surgeon if you experience:  ° °1.  Fever over 101.0. °2.  Inability to urinate. °3.  Nausea and/or vomiting. °4.  Extreme swelling or bruising at the surgical site. °5.  Continued bleeding from the incision. °6.  Increased pain, redness or drainage from the incision. °7.   Problems related to your pain medication. °8. Any change in color, movement and/or sensation °9. Any problems and/or concerns °  ° °

## 2015-09-12 NOTE — Anesthesia Procedure Notes (Addendum)
Anesthesia Regional Block:  Pectoralis block  Pre-Anesthetic Checklist: ,, timeout performed, Correct Patient, Correct Site, Correct Laterality, Correct Procedure, Correct Position, site marked, Risks and benefits discussed,  Surgical consent,  Pre-op evaluation,  At surgeon's request and post-op pain management  Laterality: Right and Upper  Prep: chloraprep       Needles:  Injection technique: Single-shot  Needle Type: Echogenic Needle     Needle Length: 9cm 9 cm Needle Gauge: 21 and 21 G    Additional Needles:  Procedures: ultrasound guided (picture in chart) Pectoralis block Narrative:  Start time: 09/12/2015 12:49 PM End time: 09/12/2015 12:53 PM Injection made incrementally with aspirations every 5 mL.  Performed by: Personally  Anesthesiologist: CREWS, DAVID   Procedure Name: LMA Insertion Date/Time: 09/12/2015 1:08 PM Performed by: Melynda Ripple D Pre-anesthesia Checklist: Patient identified, Emergency Drugs available, Suction available and Patient being monitored Patient Re-evaluated:Patient Re-evaluated prior to inductionOxygen Delivery Method: Circle System Utilized Preoxygenation: Pre-oxygenation with 100% oxygen Intubation Type: IV induction Ventilation: Mask ventilation without difficulty LMA: LMA inserted LMA Size: 4.0 Number of attempts: 1 Airway Equipment and Method: Bite block Placement Confirmation: positive ETCO2 Tube secured with: Tape Dental Injury: Teeth and Oropharynx as per pre-operative assessment

## 2015-09-12 NOTE — Transfer of Care (Signed)
Immediate Anesthesia Transfer of Care Note  Patient: Cassandra Maldonado  Procedure(s) Performed: Procedure(s): LEFT BREAST RADIOACTIVE SEED LOCALIZED PARTILA MASTECTOMY WITH SENTINEL LYMPH NODE MAPPING AND PORT REMOVAL (Left) REMOVAL PORT-A-CATH (Right)  Patient Location: PACU  Anesthesia Type:General and Regional  Level of Consciousness: awake and alert   Airway & Oxygen Therapy: Patient Spontanous Breathing and Patient connected to face mask oxygen  Post-op Assessment: Report given to RN and Post -op Vital signs reviewed and stable  Post vital signs: Reviewed and stable  Last Vitals:  Filed Vitals:   09/12/15 1300  BP:   Pulse: 88  Temp:   Resp: 15    Complications: No apparent anesthesia complications

## 2015-09-12 NOTE — Progress Notes (Signed)
Assisted Dr. Crews with left, ultrasound guided, pectoralis block. Side rails up, monitors on throughout procedure. See vital signs in flow sheet. Tolerated Procedure well. 

## 2015-09-12 NOTE — Progress Notes (Signed)
Radiology staff performed nuc med inj. No additional sedation required. Pt tol well. VSS

## 2015-09-12 NOTE — Anesthesia Preprocedure Evaluation (Addendum)
Anesthesia Evaluation  Patient identified by MRN, date of birth, ID band Patient awake    Reviewed: Allergy & Precautions, NPO status , Patient's Chart, lab work & pertinent test results  Airway Mallampati: II  TM Distance: >3 FB Neck ROM: Full    Dental  (+) Upper Dentures, Lower Dentures   Pulmonary    breath sounds clear to auscultation       Cardiovascular hypertension, Pt. on medications  Rhythm:Regular Rate:Normal     Neuro/Psych    GI/Hepatic   Endo/Other  Morbid obesity  Renal/GU      Musculoskeletal   Abdominal   Peds  Hematology   Anesthesia Other Findings   Reproductive/Obstetrics                            Anesthesia Physical Anesthesia Plan  ASA: III  Anesthesia Plan: General and Regional   Post-op Pain Management:    Induction: Intravenous  Airway Management Planned: LMA  Additional Equipment:   Intra-op Plan:   Post-operative Plan: Extubation in OR  Informed Consent: I have reviewed the patients History and Physical, chart, labs and discussed the procedure including the risks, benefits and alternatives for the proposed anesthesia with the patient or authorized representative who has indicated his/her understanding and acceptance.   Dental advisory given  Plan Discussed with: CRNA, Anesthesiologist and Surgeon  Anesthesia Plan Comments:         Anesthesia Quick Evaluation

## 2015-09-12 NOTE — H&P (View-Only) (Signed)
Cassandra Maldonado 08/25/2015 9:25 AM Location: [facility] Patient #: [id number] DOB: 16-Jul-1946 Married / Language: English / Race: White Female History of Present Illness Cassandra Moores A. Ronon Ferger MD; 08/25/2015 12:37 PM) Patient words: neoadjuvant therapy complete, discuss surgery         PT RETRNS TO DISCUSS SURGERY  SHE IS DOING WELL SHE HAS HAD A COMPLETE RESPONSE    Left breast invasive mammary cancer 1:00 position: 3.1 x 2.9 x 3.6 cm tumor with multiple enlarged level MCCXIII left axillary lymph nodes largest measuring 4 cm per total congruent nor mass measures 10.2 cm ER 12%, PR 0%, Ki-67 100%, HER-2 negative ratio 1.53 CT chest abdomen pelvis and bone scan 03/17/2015: No evidence of metastatic disease Chemotherapy summary: neoadjuvant chemotherapy with dose dense Adriamycin and Cytoxan followed by Abraxane weekly 12 completed 08/18/2015 Toxicities of chemotherapy include neutropenia, thrush, fatigue, nausea, anemia requiring 1 unit of PRBC, dehydration, leg cramps, hypertension, neuropathy grade 1, nail changes Breast MRI 08/21/2015: Complete imaging response to new adjuvant chemotherapy, no residual abnormal or asymmetric mass seen, lymph nodes completely resolved  Radiology review: I discussed the final MRI report and showed her before and after pictures. Patient is ecstatic hearing these test results.  Recommendation: We presented her case in tumor board and the recommendations are 1. Breast conserving surgery 2. Followed by radiation             CLINICAL DATA: Assess response neoadjuvant chemotherapy. 69 year old patient diagnosed in March 2016 with high-grade mammary carcinoma 1 o'clock position left breast and left axillary nodal metastasis, following ultrasound-guided biopsy of the left breast mass and one of several enlarged left axillary lymph nodes. LABS: None obtained EXAM: BILATERAL BREAST MRI WITH AND WITHOUT CONTRAST TECHNIQUE:  Multiplanar, multisequence MR images of both breasts were obtained prior to and following the intravenous administration of 20 ml of MultiHance. THREE-DIMENSIONAL MR IMAGE RENDERING ON INDEPENDENT WORKSTATION: Three-dimensional MR images were rendered by post-processing of the original MR data on an independent workstation. The three-dimensional MR images were interpreted, and findings are reported in the following complete MRI report for this study. Three dimensional images were evaluated at the independent DynaCad workstation COMPARISON: Mammogram and ultrasound images of 03/02/2015 and prior breast MRI dated 03/08/2015 FINDINGS: Breast composition: b. Scattered fibroglandular tissue. Background parenchymal enhancement: Cassandra Maldonado Right breast: No mass or abnormal enhancement. Left breast: There as been a complete imaging response to neoadjuvant chemotherapy. No residual abnormal or asymmetric enhancement or mass is seen in the left breast. Biopsy clip is noted in the middle third of the left breast at 1 o'clock position. Lymph nodes: Interval resolution of enlarged left axillary lymph nodes. Left axillary lymph nodes are visible, but are now within normal limits for size and cortical thickness. No right axillary lymphadenopathy. No internal mammary chain lymphadenopathy. Ancillary findings: None. IMPRESSION: Significant improvement/complete imaging response following neoadjuvant chemotherapy. No residual mass or abnormal enhancement is seen in the left breast, and left axillary lymphadenopathy has resolved. RECOMMENDATION: Continue treatment planning BI-RADS CATEGORY 6: Known biopsy-proven malignancy. Electronically Signed By: Curlene Dolphin M.D. On: 08/21/2015 08:38 .  The patient is a 69 year old female   Problem List/Past Medical [name], [place]; 08/25/2015 9:29 AM) BREAST CANCER, LEFT (174.9  C50.912)  Other Problems [name], CMA; 08/25/2015 9:29 AM) Back  Pain Breast Cancer Cholelithiasis Hemorrhoids High blood pressure Lump In Breast  Past Surgical History [name], CMA; 08/25/2015 9:29 AM) Breast Biopsy Left. Cesarean Section - Multiple Hemorrhoidectomy Hysterectomy (  not due to cancer) - Partial  Diagnostic Studies History [name], [place]; 08/25/2015 9:29 AM) Colonoscopy never Mammogram within last year Pap Smear >5 years ago  Allergies [name], CMA; 08/25/2015 9:29 AM) No Known Drug Allergies 03/06/2015  Medication History [name], [place]; 08/25/2015 9:29 AM) Allopurinol (100MG Tablet, Oral) Active. Lisinopril-Hydrochlorothiazide (10-12.5MG Tablet, Oral) Active. Aspirin (81MG Tablet, Oral) Active. Medications Reconciled  Social History [name], [place]; 08/25/2015 9:29 AM) Caffeine use Carbonated beverages, Coffee. No alcohol use No drug use Tobacco use Never smoker.  Family History [name], [place]; 08/25/2015 9:29 AM) Cancer Mother, Sister. Malignant Neoplasm Of Pancreas Father.  Pregnancy / Birth History [name], [place]; 08/25/2015 9:29 AM) Age at menarche 6 years. Age of menopause >60 Gravida 68 Maternal age 81-35 Para 2    Vitals [name] CMA; 08/25/2015 9:28 AM) 08/25/2015 9:28 AM Weight: 213.38 lb Height: 65in Body Surface Area: 2.11 m Body Mass Index: 35.51 kg/m Pulse: 76 (Regular)  BP: 138/86 (Sitting, Left Arm, Standard)     Physical Exam ([name] MD; 08/25/2015 12:38 PM)  General Mental Status-Alert. General Appearance-Consistent with stated age. Hydration-Well hydrated. Voice-Normal.  Head and Neck Head-normocephalic, atraumatic with no lesions or palpable masses. Trachea-midline. Thyroid Gland Characteristics - normal size and consistency.  Eye Eyeball - Bilateral-Extraocular movements intact. Sclera/Conjunctiva - Bilateral-No scleral icterus.  Chest and Lung  Exam Chest and lung exam reveals -quiet, even and easy respiratory effort with no use of accessory muscles and on auscultation, normal breath sounds, no adventitious sounds and normal vocal resonance. Inspection Chest Wall - Normal. Back - normal.  Breast Breast - Left-Symmetric, Non Tender, No Biopsy scars, no Dimpling, No Inflammation, No Lumpectomy scars, No Mastectomy scars, No Peau d' Orange. Breast - Right-Symmetric, Non Tender, No Biopsy scars, no Dimpling, No Inflammation, No Lumpectomy scars, No Mastectomy scars, No Peau d' Orange. Breast Lump-No Palpable Breast Mass.  Cardiovascular Cardiovascular examination reveals -normal heart sounds, regular rate and rhythm with no murmurs and normal pedal pulses bilaterally.  Abdomen Inspection Inspection of the abdomen reveals - No Hernias. Skin - Scar - no surgical scars. Palpation/Percussion Palpation and Percussion of the abdomen reveal - Soft, Non Tender, No Rebound tenderness, No Rigidity (guarding) and No hepatosplenomegaly. Auscultation Auscultation of the abdomen reveals - Bowel sounds normal.  Neurologic Neurologic evaluation reveals -alert and oriented x 3 with no impairment of recent or remote memory. Mental Status-Normal.  Musculoskeletal Normal Exam - Left-Upper Extremity Strength Normal and Lower Extremity Strength Normal. Normal Exam - Right-Upper Extremity Strength Normal and Lower Extremity Strength Normal.  Lymphatic Head & Neck  General Head & Neck Lymphatics: Bilateral - Description - Normal. Axillary  General Axillary Region: Bilateral - Description - Normal. Tenderness - Non Tender. Femoral & Inguinal  Generalized Femoral & Inguinal Lymphatics: Bilateral - Description - Normal. Tenderness - Non Tender.    Assessment & Plan (Kourtnei Rauber A. Frazer [place] MD; 08/25/2015 12:38 PM)  BREAST CANCER, LEFT (174.9  C50.912) Impression: complete radiological response. Candidate for left partial mastectomy  and SLN mapping/ ALND. May be candidate for Alliance trial. Will double check with coordinator. Discussed standard of care and ALND vs SLN mapping. Pt understands difference. She is ok with SLN mapping after this discussion. Remove port. Risk of lumpectomy include bleeding, infection, seroma, more surgery, use of seed/wire, wound care, cosmetic deformity and the need for other treatments, death , blood clots, death. Pt agrees to proceed. Risk of sentinel lymph node mapping  include bleeding, infection, lymphedema, shoulder pain. stiffness, dye allergy. cosmetic deformity , blood clots, death, need for more surgery. Pt agres to proceed.  Current Plans Pt Education - Overview of the treatment of newly diagnosed, non-metastatic breast cancer: discussed with patient and provided information. Pt Education - [facility] Breast Biopsy HCI: discussed with patient and provided information. Pt Education - flb breast cancer surgery: discussed with patient and provided information.   The anatomy and the physiology was discussed. The pathophysiology and natural history of the disease was discussed. Options were discussed and recommendations were made. Technique, risks, benefits, & alternatives were discussed. Risks such as stroke, heart attack, bleeding, indection, death, and other risks discussed. Questions answered. The patient agrees to proceed.

## 2015-09-12 NOTE — Interval H&P Note (Signed)
History and Physical Interval Note:  09/12/2015 12:42 PM  Cassandra Maldonado  has presented today for surgery, with the diagnosis of Left Breast Cancer  The various methods of treatment have been discussed with the patient and family. After consideration of risks, benefits and other options for treatment, the patient has consented to  Procedure(s): LEFT BREAST RADIOACTIVE SEED LOCALIZED PARTILA MASTECTOMY WITH SENTINEL LYMPH NODE MAPPING AND PORT REMOVAL (Left) REMOVAL PORT-A-CATH (N/A) as a surgical intervention .  The patient's history has been reviewed, patient examined, no change in status, stable for surgery.  I have reviewed the patient's chart and labs.  Questions were answered to the patient's satisfaction.     CORNETT,THOMAS A.

## 2015-09-12 NOTE — Op Note (Signed)
Preoperative diagnosis: Left breast cancer stage II and Port-A-Cath in place  Postoperative diagnosis: Same  Procedure: Left breast seed localized partial mastectomy with left axillary sentinel lymph node mapping and Port-A-Cath removal  Surgeon: Erroll Luna M.D.  Anesthesia: Gen. with left pectoral block and 0.25% Sensorcaine local  EBL: Minimal  Drains: None  Specimen: Left breast tissue with clip and seed and for left axillary sentinel nodes  Indications for procedure: Patient presents for breast conservation surgery after completing neoadjuvant chemotherapy. She did not was to partake in the Alliance trial. She did not want axillary lymph node dissection given the fact that she had a positive no pretreatment. She understood that this is a deviation from standards care but was adamant about not having this done. She understands implications of this.The procedure has been discussed with the patient. Alternatives to surgery have been discussed with the patient.  Risks of surgery include bleeding,  Infection,  Seroma formation, death,  and the need for further surgery.   The patient understands and wishes to proceed.Sentinel lymph node mapping and dissection has been discussed with the patient.  Risk of bleeding,  Infection,  Seroma formation,  Additional procedures,,  Shoulder weakness ,  Shoulder stiffness,  Nerve and blood vessel injury and reaction to the mapping dyes have been discussed.  Alternatives to surgery have been discussed with the patient.  The patient agrees to proceed.  Description of procedure: Patient was met in the holding area. She had a pectoral block placed by anesthesia and injection of technetium sulfur colloid in the left breast. Neoprobe used to verify proper see location. Left breast marked as correct side. A mark was placed of Port-A-Cath. Questions are answered. She's and taken back to the operating room placed supine on the OR table. After induction of general  anesthesia, upper chest and neck regions and arms were prepped and draped in a sterile fashion. Timeout was done. 4 mL of methylene blue dye were injected in the left nipple massage was done. The Port-A-Cath was removed first. Incision made through the old scar just below the right clavicle. The Port-A-Cath identified and removed. Removed in its entirety. The tract was closed with 3-0 Vicryl. 3-0 Vicryl for Monocryl used to close the incision. Dermabond applied.  Left breast seed localized partial mastectomy done next. Curvilinear incision made along the superior border of the nipple areolar complex. Dissection was carried toward the signal of the seed in the left breast upper outer quadrant. All tissue around this was excised with grossly negative margin. Radiograph revealed seed/ clip to be in the specimen. Cavity was made hemostatic with cautery. It was closed with 3-0 Vicryl and 4-0 Monocryl. Clips are placed prior to closing.  Sentinel node done next. Neoprobe used to identify hotspot in left axilla and 3 cm incision made. Dissection carried through subcutaneous fat into the axilla contents were encountered. There are for hot non-blue left axillary level I sentinel nodes that were removed. Hemostasis achieved cautery and Surgicel snow. Background counts approached 0. Wound closed with 3-0 Vicryl and 4-0 Monocryl. Liquid was applied. Stress binder placed. All final counts were correct. Patient awoke taken recovery in satisfactory condition.

## 2015-09-13 ENCOUNTER — Encounter (HOSPITAL_BASED_OUTPATIENT_CLINIC_OR_DEPARTMENT_OTHER): Payer: Self-pay | Admitting: Surgery

## 2015-09-15 ENCOUNTER — Telehealth: Payer: Self-pay | Admitting: *Deleted

## 2015-09-15 ENCOUNTER — Telehealth: Payer: Self-pay

## 2015-09-15 NOTE — Telephone Encounter (Signed)
Prior Auth for Ativan faxed to CoverMyMeds.  Sent to scan.   Call report rcvd from [facility] dtd 09/14/15 re: pt prescription.  Sent to scan.  Returned call to [employer] - rep does not find any notes on the call - advised not to worry about it as both ativan and zofran werew approved.

## 2015-09-15 NOTE — Telephone Encounter (Signed)
[employer] called stating that patient prior authorization for ativan has been accepted (Care acceptance: Tier 1). Message sent to Raquel browning.

## 2015-09-18 ENCOUNTER — Encounter: Payer: Self-pay | Admitting: Hematology and Oncology

## 2015-09-18 NOTE — Progress Notes (Signed)
Nurse faxed prior auth for to [employer] [phone]

## 2015-09-18 NOTE — Assessment & Plan Note (Signed)
Left breast invasive mammary cancer 1:00 position: 3.1 x 2.9 x 3.6 cm tumor with multiple enlarged level MCCXIII left axillary lymph nodes largest measuring 4 cm per total congruent nor mass measures 10.2 cm ER 12%, PR 0%, Ki-67 100%, HER-2 negative ratio 1.53  Chemotherapy summary: neoadjuvant chemotherapy with dose dense Adriamycin and Cytoxan followed by Abraxane weekly 12 completed 08/18/2015 Left Lumpectomy 09/12/15: Path CR; 0/5 LN Negative  Pathology Review: Discussed the path report in detail and provided her a copy of this report. Patient is very happy about the Path CR.  Plan: 1. Radiation 2. Followed by antiestrogen therapy with anastrozole x 5 years  RTC at end of XRT

## 2015-09-19 ENCOUNTER — Telehealth: Payer: Self-pay | Admitting: Hematology and Oncology

## 2015-09-19 ENCOUNTER — Ambulatory Visit (HOSPITAL_BASED_OUTPATIENT_CLINIC_OR_DEPARTMENT_OTHER): Payer: Medicare Other | Admitting: Hematology and Oncology

## 2015-09-19 ENCOUNTER — Encounter: Payer: Self-pay | Admitting: *Deleted

## 2015-09-19 ENCOUNTER — Encounter: Payer: Self-pay | Admitting: Hematology and Oncology

## 2015-09-19 VITALS — BP 135/70 | HR 73 | Temp 98.1°F | Resp 18 | Ht 65.0 in | Wt 212.8 lb

## 2015-09-19 DIAGNOSIS — C50412 Malignant neoplasm of upper-outer quadrant of left female breast: Secondary | ICD-10-CM

## 2015-09-19 DIAGNOSIS — G622 Polyneuropathy due to other toxic agents: Secondary | ICD-10-CM | POA: Diagnosis not present

## 2015-09-19 DIAGNOSIS — D6481 Anemia due to antineoplastic chemotherapy: Secondary | ICD-10-CM | POA: Diagnosis not present

## 2015-09-19 DIAGNOSIS — T451X5A Adverse effect of antineoplastic and immunosuppressive drugs, initial encounter: Secondary | ICD-10-CM

## 2015-09-19 DIAGNOSIS — G620 Drug-induced polyneuropathy: Secondary | ICD-10-CM

## 2015-09-19 MED ORDER — METRONIDAZOLE 500 MG PO TABS: 500.0000 mg | ORAL_TABLET | Freq: Three times a day (TID) | ORAL | Status: DC

## 2015-09-19 NOTE — Telephone Encounter (Signed)
Rad onc ref in and theywill call the patient

## 2015-09-19 NOTE — Progress Notes (Signed)
Patient Care Team: Midge Minium, MD as PCP - General (Family Medicine) Mackie Pai, PA-C as Physician Assistant (Physician Assistant)  DIAGNOSIS: No matching staging information was found for the patient.  SUMMARY OF ONCOLOGIC HISTORY:   Breast cancer of upper-outer quadrant of left female breast   03/02/2015 Initial Diagnosis Left breast biopsy 1:00: Invasive mammary cancer, grade 3, 1 lymph node biopsy in the axilla positive, ER 12%, PR 0%, Ki-67 100%, HER-2 negative ratio 1.53   03/08/2015 Breast MRI Left breast 1:00 position: 3.6 cm mass with enlarged axillary lymph nodes multiple levels largest 4 cm, total mass measures 10.2 cm   03/30/2015 - 08/17/2015 Neo-Adjuvant Chemotherapy Adriamycin Cytoxan 4 followed by Abraxane weekly 12   08/21/2015 Breast MRI Complete imaging response to new adjuvant chemotherapy, no residual abnormal or asymmetric mass seen, lymph nodes completely resolved   09/12/2015 Surgery Left Lumpectomy: Path CR; 0/5 LN Negatives    CHIEF COMPLIANT: Follow-up after left lumpectomy  INTERVAL HISTORY: Cassandra Maldonado is a 69 year old with above-mentioned history of left breast cancer treated with neo-adjuvant chemotherapy followed by lumpectomy. She had a pathologic complete response. She is here today to discuss the final pathology report. She complains today of abdominal cramping and intermittent loose stools. This is been going on for over a week. She also feels mildly nauseated. Complains of neuropathy in the feet. It feels likes and between her toes.  REVIEW OF SYSTEMS:   Constitutional: Denies fevers, chills or abnormal weight loss Eyes: Denies blurriness of vision Ears, nose, mouth, throat, and face: Denies mucositis or sore throat Respiratory: Denies cough, dyspnea or wheezes Cardiovascular: Denies palpitation, chest discomfort or lower extremity swelling Gastrointestinal:  Denies nausea, heartburn or change in bowel habits Skin: Denies abnormal skin  rashes Lymphatics: Denies new lymphadenopathy or easy bruising Neurological: Neuropathy in the feet Behavioral/Psych: Mood is stable, no new changes  Breast: Soreness from recent surgery All other systems were reviewed with the patient and are negative.  I have reviewed the past medical history, past surgical history, social history and family history with the patient and they are unchanged from previous note.  ALLERGIES:  has No Known Allergies.  MEDICATIONS:  Current Outpatient Prescriptions  Medication Sig Dispense Refill  . acyclovir (ZOVIRAX) 400 MG tablet Take 1 tablet (400 mg total) by mouth 2 (two) times daily. 60 tablet 2  . HYDROcodone-acetaminophen (NORCO) 5-325 MG per tablet Take 1-2 tablets by mouth every 6 (six) hours as needed for moderate pain. 30 tablet 0  . LORazepam (ATIVAN) 0.5 MG tablet Take 1 tablet (0.5 mg total) by mouth at bedtime. 30 tablet 0  . ondansetron (ZOFRAN) 8 MG tablet Take 1 tablet (8 mg total) by mouth 2 (two) times daily as needed for nausea or vomiting. 30 tablet 0  . oxyCODONE-acetaminophen (ROXICET) 5-325 MG per tablet Take 1 tablet by mouth every 4 (four) hours as needed. 30 tablet 0  . prochlorperazine (COMPAZINE) 10 MG tablet TAKE ONE TABLET BY MOUTH EVERY 6 HOURS AS NEEDED FOR NAUSEA AND VOMITING 30 tablet 0   No current facility-administered medications for this visit.    PHYSICAL EXAMINATION: ECOG PERFORMANCE STATUS: 2 - Symptomatic, <50% confined to bed  Filed Vitals:   09/19/15 1447  BP: 135/70  Pulse: 73  Temp: 98.1 F (36.7 C)  Resp: 18   Filed Weights   09/19/15 1447  Weight: 212 lb 12.8 oz (96.525 kg)    GENERAL:alert, no distress and comfortable SKIN: skin color, texture, turgor are normal, no  rashes or significant lesions EYES: normal, Conjunctiva are pink and non-injected, sclera clear OROPHARYNX:no exudate, no erythema and lips, buccal mucosa, and tongue normal  NECK: supple, thyroid normal size, non-tender, without  nodularity LYMPH:  no palpable lymphadenopathy in the cervical, axillary or inguinal LUNGS: clear to auscultation and percussion with normal breathing effort HEART: regular rate & rhythm and no murmurs and no lower extremity edema ABDOMEN:abdomen soft, non-tender and normal bowel sounds Musculoskeletal:no cyanosis of digits and no clubbing  NEURO: alert & oriented x 3 with fluent speech, no focal motor/sensory deficits  LABORATORY DATA:  I have reviewed the data as listed   Chemistry      Component Value Date/Time   NA 137 09/07/2015 1430   NA 140 08/17/2015 0902   K 3.7 09/07/2015 1430   K 3.6 08/17/2015 0902   CL 100* 09/07/2015 1430   CO2 29 09/07/2015 1430   CO2 29 08/17/2015 0902   BUN 15 09/07/2015 1430   BUN 14.3 08/17/2015 0902   CREATININE 1.08* 09/07/2015 1430   CREATININE 1.0 08/17/2015 0902      Component Value Date/Time   CALCIUM 9.6 09/07/2015 1430   CALCIUM 10.0 08/17/2015 0902   ALKPHOS 76 09/07/2015 1430   ALKPHOS 97 08/17/2015 0902   AST 17 09/07/2015 1430   AST 22 08/17/2015 0902   ALT 12* 09/07/2015 1430   ALT 15 08/17/2015 0902   BILITOT 0.6 09/07/2015 1430   BILITOT 0.54 08/17/2015 0902       Lab Results  Component Value Date   WBC 7.9 09/07/2015   HGB 9.6* 09/07/2015   HCT 30.0* 09/07/2015   MCV 94.0 09/07/2015   PLT 345 09/07/2015   NEUTROABS 5.0 09/07/2015     ASSESSMENT & PLAN:  Breast cancer of upper-outer quadrant of left female breast Left breast invasive mammary cancer 1:00 position: 3.1 x 2.9 x 3.6 cm tumor with multiple enlarged level MCCXIII left axillary lymph nodes largest measuring 4 cm per total congruent nor mass measures 10.2 cm ER 12%, PR 0%, Ki-67 100%, HER-2 negative ratio 1.53  Chemotherapy summary: neoadjuvant chemotherapy with dose dense Adriamycin and Cytoxan followed by Abraxane weekly 12 completed 08/18/2015 Left Lumpectomy 09/12/15: Path CR; 0/5 LN Negative  Pathology Review: Discussed the path report in detail  and provided her a copy of this report. Patient is very happy about the Path CR.  Plan: 1. Radiation 2. Followed by antiestrogen therapy with anastrozole x 5 years  Diarrhea and abdominal cramps: I suspect that she may have C. difficile infection, she was unable to provide a stool sample today. I prescribed her Flagyl to be taken for 7-10 days.  Neuropathy: Chemotherapy-induced, I instructed her to massage the feet, apply Vicks VapoRub, take vitamin B-12 supplementation. Also to drink tonic water at bedtime to ease the myalgias.  RTC at end of XRT  No orders of the defined types were placed in this encounter.   The patient has a good understanding of the overall plan. she agrees with it. she will call with any problems that may develop before the next visit here.   Rulon Eisenmenger, MD

## 2015-09-21 ENCOUNTER — Encounter: Payer: Self-pay | Admitting: Hematology and Oncology

## 2015-09-21 NOTE — Progress Notes (Signed)
[employer] approved lorazepam 09/14/15-09/13/16. I sent to medical records   Pa [phone]

## 2015-09-27 NOTE — Progress Notes (Signed)
Location of Breast Cancer:left upper-outer quadrant Initial diagnosis on 03/02/15. Histology per Pathology Report:09/12/15  Diagnosis 1. Breast, lumpectomy, left - FIBROSIS WITH INFLAMMATION AND HISTIOCYTIC INFILTRATES. - NO RESIDUAL MALIGNANCY IDENTIFIED. - BIOPSY SITE AND BIOPSY CLIP IDENTIFIED. - FINAL MARGINS CLEAR. 2. Lymph node, sentinel, biopsy, left axillary - ONE BENIGN LYMPH NODE (0/1). 3. Lymph node, sentinel, biopsy, left axillary - ONE BENIGN LYMPH NODE WITH FIBROSIS AND HISTIOCYTES (0/1). 4. Lymph node, sentinel, biopsy, left axillary - ONE BENIGN LYMPH NODE (0/1). 5. Lymph node, sentinel, biopsy, left axillary - ONE BENIGN LYMPH NODE (0/1). 6. Lymph node, sentinel, biopsy, left axillary - ONE BENIGN LYMPH NODE WITH FIBROSIS AND HISTIOCYTES (0/1). Receptor Status: ER(+), PR (+), Her2-neu (-)  Did patient present with symptoms (if so, please note symptoms) or was this found on screening mammography?:palpated by patient.  Past/Anticipated interventions by surgeon, if any:09/12/15 RADIOACTIVE SEED GUIDED PARTIAL MASTECTOMY WITH AXILLARY SENTINEL LYMPH NODE BIOPSY by Dr.Thomas Cornett.  Past/Anticipated interventions by medical oncology, if any: Chemotherapy:Adriamycin and Cytoxan x4 followed by Abraxane weekly x 12. 03/30/15- 08/17/15.reccomend radiation followed by 5 years of anastrozole therapy.  Lymphedema issues, if any:No  Pain issues, if any: abdominal cramps.on flagyl.   SAFETY ISSUES:  Prior radiation? No  Pacemaker/ICD?No  Possible current pregnancy?post-menopausal.hysterectomy 1984.  Is the patient on methotrexate?No  Current Complaints / other details:Married.Menarche age 51.G2P2.first child age 37. No alcohol or smoking history  NKDA Sister:ovarian cancer    Arlyss Repress, RN 09/27/2015,5:06 PM

## 2015-09-28 ENCOUNTER — Ambulatory Visit
Admission: RE | Admit: 2015-09-28 | Discharge: 2015-09-28 | Disposition: A | Discharge status: Home / Self Care | Payer: Medicare Other | Source: Ambulatory Visit | Attending: Radiation Oncology | Admitting: Radiation Oncology

## 2015-09-28 ENCOUNTER — Encounter: Payer: Self-pay | Admitting: Radiation Oncology

## 2015-09-28 ENCOUNTER — Encounter: Payer: Self-pay | Admitting: Genetic Counselor

## 2015-09-28 VITALS — BP 146/89 | HR 75 | Temp 98.6°F | Wt 214.3 lb

## 2015-09-28 DIAGNOSIS — Z170 Estrogen receptor positive status [ER+]: Secondary | ICD-10-CM | POA: Diagnosis not present

## 2015-09-28 DIAGNOSIS — C50412 Malignant neoplasm of upper-outer quadrant of left female breast: Secondary | ICD-10-CM

## 2015-09-28 DIAGNOSIS — R590 Localized enlarged lymph nodes: Secondary | ICD-10-CM | POA: Diagnosis not present

## 2015-09-28 DIAGNOSIS — Z9889 Other specified postprocedural states: Secondary | ICD-10-CM | POA: Diagnosis not present

## 2015-09-28 DIAGNOSIS — C50912 Malignant neoplasm of unspecified site of left female breast: Secondary | ICD-10-CM | POA: Diagnosis not present

## 2015-09-28 NOTE — Progress Notes (Signed)
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 female who presented to her PCP with a palpated left breast lump on 02/22/15. A mammogram on 02/24/15 showed a 3.7 cm irregular mass with multiple enlarged left axillary lymph nodes. Ultrasound on the same day showed a 2.9 x 2.6 x 3.2 cm irregular left breast mass with multiple enlarged left axillary lymph nodes with cortical thickening. Biopsy on 03/02/15 was invasive mammary carcinoma, ER 12%, PR 0%, HER2 negative, and Ki67 at 100%. A left axillary lymph node was metastatic. MRI of the bilateral breasts on 03/08/15 confirmed a mass measuring 3.1 x 2.9 x 3.6 cm in the left breast and multiple enlarged level left axillary lymph nodes. On 03/17/15, chest CT and bone scan showed the left breast mass and lymph nodes, but no metastatic disease. She was referred to Dr Lindi Adie for neoadjuvant chemotherapy with dose dense Adriamycin and Cytoxan followed by Abraxane weekly x12 completed on 08/18/15. She had a left breast lumpectomy on 09/12/15. This showed a pathologic complete response in both the breast and lymph nodes. She has recovered well from surgery and presents to me for the consideration of radiotherapy for the management of her disease. She is accompanied by her husband.   PREVIOUS RADIATION THERAPY: No  Past medical, social and family history were reviewed in the electronic chart. Review of symptoms was reviewed in the electronic chart. Medications were reviewed in the electronic chart.   PHYSICAL EXAM:  Filed Vitals:   09/28/15 1547  BP: 146/89  Pulse: 75  Temp: 98.6 F (37 C)  .214 lb 4.8 oz (97.206 kg). Pleasant woman who appears her stated  age. Alert and oriented x 3. No palpable abnoramilty of the right breast. A healing periareolar incision of the left breast with surgical glue in place. No sign of infection. Palpable seroma cavity. Healing sentinel lymph node incision in the left arm. Good ROM in the bilateral upper extremities. No lymphedema.  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   PLAN: I spoke to the patient today regarding her diagnosis and options for treatment. We discussed the equivalence in terms of survival and local failure between mastectomy and breast conservation. We discussed the role of radiation in decreasing local failures in patients who undergo lumpectomy. We discussed the process of simulation and the placement tattoos. We discussed 6 weeks of treatment as an outpatient. We discussed the possibility of asymptomatic lung damage. We discussed the low likelihood of secondary malignancies. We discussed the possible side effects including but not limited to skin redness, fatigue, permanent skin darkening, and breast swelling.  We discussed the use of cardiac sparing with deep inspiration breath hold if needed.  We discussed treatment of her breast and axilla.   The patient has not seen Dr. Brantley Stage for post-surgery follow up. Her appointment with him is later this month. Once Dr. Brantley Stage clears her, she should call the CT simulation therapists (I have provided her their number) to schedule her CT simulation.  I spent 60 minutes  face to face with the patient and more than 50% of that time was spent in counseling and/or coordination of care.  This document serves as a  record of services personally performed by Thea Silversmith, MD. It was created on her behalf by Darcus Austin, a trained medical scribe. The creation of this record is based on the scribe's personal observations and the provider's statements to them. This document has been checked and approved by the  attending provider.     ------------------------------------------------  Thea Silversmith, MD

## 2015-10-02 ENCOUNTER — Other Ambulatory Visit: Payer: Self-pay | Admitting: *Deleted

## 2015-10-02 ENCOUNTER — Telehealth: Payer: Self-pay | Admitting: *Deleted

## 2015-10-02 DIAGNOSIS — C50412 Malignant neoplasm of upper-outer quadrant of left female breast: Secondary | ICD-10-CM

## 2015-10-02 MED ORDER — DICYCLOMINE HCL 20 MG PO TABS: 20.0000 mg | ORAL_TABLET | Freq: Four times a day (QID) | ORAL | Status: DC

## 2015-10-02 NOTE — Telephone Encounter (Signed)
Patient called stating that she completed her course of Flagyl but has not seen any improvement in her symptoms. C/o abdominal pain with cramping and lower back pain, has loose stools but not watery diarrhea. Spoke with Dr. Lindi Adie and Marin Roberts called to pharmacy, advised patient's husband of this. Also advised to call us if symptoms do not improve within a week.

## 2015-10-12 NOTE — Addendum Note (Signed)
Encounter addended by: Doreen Beam, RN on: 10/12/2015 11:37 AM<BR>     Documentation filed: Charges VN

## 2015-10-13 ENCOUNTER — Telehealth: Payer: Self-pay

## 2015-10-13 DIAGNOSIS — C50412 Malignant neoplasm of upper-outer quadrant of left female breast: Secondary | ICD-10-CM

## 2015-10-13 MED ORDER — CIPROFLOXACIN HCL 500 MG PO TABS: 500.0000 mg | ORAL_TABLET | Freq: Two times a day (BID) | ORAL | Status: DC

## 2015-10-13 NOTE — Telephone Encounter (Signed)
Returned pt call re: symptoms of UTI - Stomach pain, urgency, intermittent inability to start stream, burning, low back pain, pain with urination, pressure.  Per Dr Lindi Adie, Cipro BID x 7 days. Rx sent.  Pt notified.

## 2015-10-31 ENCOUNTER — Telehealth: Payer: Self-pay | Admitting: *Deleted

## 2015-10-31 NOTE — Telephone Encounter (Signed)
Spoke with patient today to follow up about radiation therapy.  Patient states she has decided not to receive radiation therapy.  I informed her she needed to come in for follow up with Dr. Lindi Adie.  Appt. Made for 11/02/15 at 815am to discuss anti-estrogen.

## 2015-11-01 ENCOUNTER — Telehealth: Payer: Self-pay | Admitting: Hematology and Oncology

## 2015-11-01 NOTE — Telephone Encounter (Signed)
Patient called in to reschedule her appointment °

## 2015-11-02 ENCOUNTER — Ambulatory Visit: Payer: Medicare Other | Admitting: Hematology and Oncology

## 2015-11-07 NOTE — Assessment & Plan Note (Signed)
Left breast invasive mammary cancer 1:00 position: 3.1 x 2.9 x 3.6 cm tumor with multiple enlarged level MCCXIII left axillary lymph nodes largest measuring 4 cm per total congruent nor mass measures 10.2 cm ER 12%, PR 0%, Ki-67 100%, HER-2 negative ratio 1.53  Chemotherapy summary: neoadjuvant chemotherapy with dose dense Adriamycin and Cytoxan followed by Abraxane weekly 12 completed 08/18/2015 Left Lumpectomy 09/12/15: Path CR; 0/5 LN Negative  Pathology Review: Discussed the path report in detail and provided her a copy of this report. Patient is very happy about the Path CR.  Plan: 1. Radiation: Patient I told radiation oncology that she does not want to do radiation therapy. I strongly urged her to reconsider this decision. Her significant risk is local relapse if she does not do radiation after lumpectomy. 2. Followed by antiestrogen therapy with anastrozole x 5 years  Diarrhea and abdominal cramps: Previously treated with Flagyl for 7-10 days.  Neuropathy: Chemotherapy-induced, I instructed her to massage the feet, apply Vicks VapoRub, take vitamin B-12 supplementation. Also to drink tonic water at bedtime to ease the myalgias.

## 2015-11-08 ENCOUNTER — Ambulatory Visit (HOSPITAL_BASED_OUTPATIENT_CLINIC_OR_DEPARTMENT_OTHER): Payer: Medicare Other | Admitting: Hematology and Oncology

## 2015-11-08 ENCOUNTER — Telehealth: Payer: Self-pay | Admitting: Hematology and Oncology

## 2015-11-08 VITALS — BP 153/84 | HR 83 | Temp 98.5°F | Resp 18 | Ht 65.0 in | Wt 208.0 lb

## 2015-11-08 DIAGNOSIS — C50412 Malignant neoplasm of upper-outer quadrant of left female breast: Secondary | ICD-10-CM

## 2015-11-08 DIAGNOSIS — G620 Drug-induced polyneuropathy: Secondary | ICD-10-CM

## 2015-11-08 DIAGNOSIS — C773 Secondary and unspecified malignant neoplasm of axilla and upper limb lymph nodes: Secondary | ICD-10-CM

## 2015-11-08 DIAGNOSIS — R39198 Other difficulties with micturition: Secondary | ICD-10-CM

## 2015-11-08 NOTE — Telephone Encounter (Signed)
Appointments made and avs printed for patient °

## 2015-11-08 NOTE — Progress Notes (Signed)
Patient Care Team: Midge Minium, MD as PCP - General (Family Medicine) Mackie Pai, PA-C as Physician Assistant (Physician Assistant)  DIAGNOSIS: Breast cancer of upper-outer quadrant of left female breast [facility])   Staging form: Breast, AJCC 7th Edition     Pathologic stage from 09/28/2015: yT0, N0, cM0 - Signed by Thea Silversmith, MD on 09/28/2015   SUMMARY OF ONCOLOGIC HISTORY:   Breast cancer of upper-outer quadrant of left female breast ([place])   03/02/2015 Initial Diagnosis Left breast biopsy 1:00: Invasive mammary cancer, grade 3, 1 lymph node biopsy in the axilla positive, ER 12%, PR 0%, Ki-67 100%, HER-2 negative ratio 1.53   03/08/2015 Breast MRI Left breast 1:00 position: 3.6 cm mass with enlarged axillary lymph nodes multiple levels largest 4 cm, total mass measures 10.2 cm   03/30/2015 - 08/17/2015 Neo-Adjuvant Chemotherapy Adriamycin Cytoxan 4 followed by Abraxane weekly 12   08/21/2015 Breast MRI Complete imaging response to new adjuvant chemotherapy, no residual abnormal or asymmetric mass seen, lymph nodes completely resolved   09/12/2015 Surgery Left Lumpectomy: Path CR; 0/5 LN Negatives    CHIEF COMPLIANT: Follow-up to discuss adjuvant treatment plan  INTERVAL HISTORY: Cassandra Maldonado is a 69 year old with above-mentioned history of left breast cancer who had a complete pathologic response to chemotherapy underwent lumpectomy. She had met with Dr. Pablo Ledger and made a decision to not do radiation therapy. She is here today to discuss that decision with me. She had urinary tract infection which was treated with ciprofloxacin. She continues to have burning with urination frequently. She has not been drinking enough liquids. She also reported a brownish material that came out of the urinary system. She does not know what the cause of that is.  REVIEW OF SYSTEMS:   Constitutional: Denies fevers, chills or abnormal weight loss Eyes: Denies blurriness of vision Ears, nose,  mouth, throat, and face: Denies mucositis or sore throat Respiratory: Denies cough, dyspnea or wheezes Cardiovascular: Denies palpitation, chest discomfort or lower extremity swelling Gastrointestinal:  Denies nausea, heartburn or change in bowel habits Skin: Denies abnormal skin rashes Lymphatics: Denies new lymphadenopathy or easy bruising Neurological: Neuropathy improving slowly Behavioral/Psych: Mood is stable, no new changes   All other systems were reviewed with the patient and are negative.  I have reviewed the past medical history, past surgical history, social history and family history with the patient and they are unchanged from previous note.  ALLERGIES:  has No Known Allergies.  MEDICATIONS:  Current Outpatient Prescriptions  Medication Sig Dispense Refill  . ciprofloxacin (CIPRO) 500 MG tablet Take 1 tablet (500 mg total) by mouth 2 (two) times daily. 14 tablet 0  . dicyclomine (BENTYL) 20 MG tablet Take 1 tablet (20 mg total) by mouth 4 (four) times daily. 28 tablet 0  . lactobacillus acidophilus (BACID) TABS tablet Take 2 tablets by mouth 3 (three) times daily.    Marland Kitchen LORazepam (ATIVAN) 1 MG tablet Take 1 mg by mouth every 8 (eight) hours.    . metroNIDAZOLE (FLAGYL) 500 MG tablet Take 1 tablet (500 mg total) by mouth 3 (three) times daily. 30 tablet 0  . ondansetron (ZOFRAN) 8 MG tablet Take 1 tablet (8 mg total) by mouth 2 (two) times daily as needed for nausea or vomiting. 30 tablet 0  . prochlorperazine (COMPAZINE) 10 MG tablet TAKE ONE TABLET BY MOUTH EVERY 6 HOURS AS NEEDED FOR NAUSEA AND VOMITING 30 tablet 0  . vitamin B-12 (CYANOCOBALAMIN) 100 MCG tablet Take 100 mcg by mouth daily.  No current facility-administered medications for this visit.    PHYSICAL EXAMINATION: ECOG PERFORMANCE STATUS: 1 - Symptomatic but completely ambulatory  Filed Vitals:   11/08/15 0834  BP: 153/84  Pulse: 83  Temp: 98.5 F (36.9 C)  Resp: 18   Filed Weights   11/08/15  0834  Weight: 208 lb (94.348 kg)    GENERAL:alert, no distress and comfortable SKIN: skin color, texture, turgor are normal, no rashes or significant lesions EYES: normal, Conjunctiva are pink and non-injected, sclera clear OROPHARYNX:no exudate, no erythema and lips, buccal mucosa, and tongue normal  NECK: supple, thyroid normal size, non-tender, without nodularity LYMPH:  no palpable lymphadenopathy in the cervical, axillary or inguinal LUNGS: clear to auscultation and percussion with normal breathing effort HEART: regular rate & rhythm and no murmurs and no lower extremity edema ABDOMEN:abdomen soft, non-tender and normal bowel sounds Musculoskeletal:no cyanosis of digits and no clubbing  NEURO: alert & oriented x 3 with fluent speech, grade 1 peripheral neuropathy   LABORATORY DATA:  I have reviewed the data as listed   Chemistry      Component Value Date/Time   NA 137 09/07/2015 1430   NA 140 08/17/2015 0902   K 3.7 09/07/2015 1430   K 3.6 08/17/2015 0902   CL 100* 09/07/2015 1430   CO2 29 09/07/2015 1430   CO2 29 08/17/2015 0902   BUN 15 09/07/2015 1430   BUN 14.3 08/17/2015 0902   CREATININE 1.08* 09/07/2015 1430   CREATININE 1.0 08/17/2015 0902      Component Value Date/Time   CALCIUM 9.6 09/07/2015 1430   CALCIUM 10.0 08/17/2015 0902   ALKPHOS 76 09/07/2015 1430   ALKPHOS 97 08/17/2015 0902   AST 17 09/07/2015 1430   AST 22 08/17/2015 0902   ALT 12* 09/07/2015 1430   ALT 15 08/17/2015 0902   BILITOT 0.6 09/07/2015 1430   BILITOT 0.54 08/17/2015 0902       Lab Results  Component Value Date   WBC 7.9 09/07/2015   HGB 9.6* 09/07/2015   HCT 30.0* 09/07/2015   MCV 94.0 09/07/2015   PLT 345 09/07/2015   NEUTROABS 5.0 09/07/2015    ASSESSMENT & PLAN:  Breast cancer of upper-outer quadrant of left female breast Left breast invasive mammary cancer 1:00 position: 3.1 x 2.9 x 3.6 cm tumor with multiple enlarged level MCCXIII left axillary lymph nodes largest  measuring 4 cm per total congruent nor mass measures 10.2 cm ER 12%, PR 0%, Ki-67 100%, HER-2 negative ratio 1.53  Chemotherapy summary: neoadjuvant chemotherapy with dose dense Adriamycin and Cytoxan followed by Abraxane weekly 12 completed 08/18/2015 Left Lumpectomy 09/12/15: Path CR; 0/5 LN Negative  Pathology Review: Discussed the path report in detail and provided her a copy of this report. Patient is very happy about the Path CR.  Plan: 1. Radiation: Patient is a told radiation oncology that she does not want to do radiation therapy. I strongly urged her to reconsider this decision. Her significant risk is local relapse if she does not do radiation after lumpectomy. She is still not decided to undergo radiation. I instructed her to reconsider and let us know what the decision is. If she decides against radiation, and we will prescribe her antiestrogen therapy. 2. Followed by antiestrogen therapy with anastrozole x 5 years I discussed the risks and benefits of antiestrogen therapy and she appears to understand this very well.  Diarrhea and abdominal cramps: Previously treated with Flagyl for 7-10 days. Resolved now takes probiotics UTI: Treated  previously with ciprofloxacin. Patient complains of burning with urination. Instructed to drink more water.  Neuropathy: Chemotherapy-induced  Return to clinic in 3 months for follow-up. She will call us and let us know of her decision so that we can call in a prescription for anastrozole.  No orders of the defined types were placed in this encounter.   The patient has a good understanding of the overall plan. she agrees with it. she will call with any problems that may develop before the next visit here.   Rulon Eisenmenger, MD 11/08/2015

## 2015-11-08 NOTE — Addendum Note (Signed)
Addended by: Jonelle Sports K on: 11/08/2015 11:04 AM   Modules accepted: Medications

## 2015-11-13 ENCOUNTER — Other Ambulatory Visit: Payer: Self-pay | Admitting: *Deleted

## 2015-11-13 ENCOUNTER — Telehealth: Payer: Self-pay | Admitting: *Deleted

## 2015-11-13 DIAGNOSIS — C50412 Malignant neoplasm of upper-outer quadrant of left female breast: Secondary | ICD-10-CM

## 2015-11-13 MED ORDER — ANASTROZOLE 1 MG PO TABS: 1.0000 mg | ORAL_TABLET | Freq: Every day | ORAL | Status: AC

## 2015-11-13 NOTE — Telephone Encounter (Signed)
Patient called stating that she is not taking radiation and to call in her medication. Prescription for Arimidex sent to pharmacy and patient advised of this, also to keep her appt in Feb but that if she has any problems to call us. She verbalized understanding.

## 2015-11-14 ENCOUNTER — Ambulatory Visit (INDEPENDENT_AMBULATORY_CARE_PROVIDER_SITE_OTHER): Payer: Medicare Other | Admitting: Family Medicine

## 2015-11-14 ENCOUNTER — Encounter: Payer: Self-pay | Admitting: Family Medicine

## 2015-11-14 ENCOUNTER — Telehealth: Payer: Self-pay | Admitting: *Deleted

## 2015-11-14 VITALS — BP 130/82 | HR 69 | Temp 98.1°F | Resp 16 | Ht 65.0 in | Wt 208.5 lb

## 2015-11-14 DIAGNOSIS — N390 Urinary tract infection, site not specified: Secondary | ICD-10-CM | POA: Diagnosis not present

## 2015-11-14 DIAGNOSIS — C50412 Malignant neoplasm of upper-outer quadrant of left female breast: Secondary | ICD-10-CM

## 2015-11-14 DIAGNOSIS — R82998 Other abnormal findings in urine: Secondary | ICD-10-CM

## 2015-11-14 DIAGNOSIS — R319 Hematuria, unspecified: Secondary | ICD-10-CM | POA: Diagnosis not present

## 2015-11-14 LAB — POCT URINALYSIS DIPSTICK
BILIRUBIN UA: NEGATIVE
GLUCOSE UA: NEGATIVE
Ketones, UA: NEGATIVE
Nitrite, UA: POSITIVE
Protein, UA: 0.15
SPEC GRAV UA: 1.02
Urobilinogen, UA: 0.2
pH, UA: 6

## 2015-11-14 MED ORDER — CEPHALEXIN 500 MG PO CAPS: 500.0000 mg | ORAL_CAPSULE | Freq: Two times a day (BID) | ORAL | Status: AC

## 2015-11-14 NOTE — Progress Notes (Signed)
Pre visit review using our clinic review tool, if applicable. No additional management support is needed unless otherwise documented below in the visit note. 

## 2015-11-14 NOTE — Patient Instructions (Signed)
Follow up on Monday by phone or MyChart to let me know if your urinary symptoms continue Drink plenty of fluids Take the Keflex as directed Call with any questions or concerns If you want to join [place] at the new [place] office, any scheduled appointments will automatically transfer and we will see you at [address] [place] [address] [place], [place] [address] (OPENING 12/26/15) Happy Holidays!!!

## 2015-11-14 NOTE — Assessment & Plan Note (Signed)
New.  Pt's dysuria, frequency, suprapubic pressure all consistent w/ UTI.  UA also suspicious.  Quite a bit of sediment in urine.  If this does not clear w/ abx tx, will need urology referral.  Pt expressed understanding and is in agreement w/ plan.

## 2015-11-14 NOTE — Progress Notes (Signed)
   Subjective:    Patient ID: Cassandra Maldonado, female    DOB: 1946-08-02, 69 y.o.   MRN: [id number]  HPI UTI- pt reports she is having 3-4 weeks of 'brown, slime-y' urinary discharge.  Pt had 1 episode where she had sensation of passing 'like a blood clot'.  No urinary odor.  Some dysuria.  + urinary frequency, urgency.  Took 7 days of Cipro starting 10/21 w/ temporary improvement of sxs.  Still having suprapubic pressure.  S/p hysterectomy.   Review of Systems For ROS see HPI     Objective:   Physical Exam  Constitutional: She is oriented to person, place, and time. She appears well-developed and well-nourished. No distress.  HENT:  Head: Normocephalic and atraumatic.  Eyes: Conjunctivae and EOM are normal. Pupils are equal, round, and reactive to light.  Abdominal: Soft. Bowel sounds are normal. She exhibits no distension. There is tenderness (mild suprpubic tenderness, no CVA tenderness).  Neurological: She is alert and oriented to person, place, and time.  Skin: Skin is warm and dry.  Psychiatric: She has a normal mood and affect. Her behavior is normal. Thought content normal.  Vitals reviewed.         Assessment & Plan:

## 2015-11-14 NOTE — Telephone Encounter (Signed)
Spoke with patient to discuss survivorship program.  Patient has decided not to pursue radiation therapy.  She is agreeable to survivorship. Referral placed.  Encouraged patient to call with any needs or concerns.

## 2015-11-15 ENCOUNTER — Telehealth: Payer: Self-pay | Admitting: Hematology and Oncology

## 2015-11-15 LAB — URINE CULTURE

## 2015-11-15 NOTE — Telephone Encounter (Signed)
Spoke with patient about survivorship and she may call back after the first of the year and schedule but request not to at this time

## 2015-11-20 ENCOUNTER — Telehealth: Payer: Self-pay | Admitting: Family Medicine

## 2015-11-20 DIAGNOSIS — R8290 Unspecified abnormal findings in urine: Secondary | ICD-10-CM

## 2015-11-20 NOTE — Telephone Encounter (Signed)
Referral placed and pt informed

## 2015-11-20 NOTE — Telephone Encounter (Signed)
Called pt back to verify what kind of symptoms she is still having. LMOVM to return call.

## 2015-11-20 NOTE — Telephone Encounter (Signed)
Pt needs referral to urology for ongoing brown urine

## 2015-11-20 NOTE — Telephone Encounter (Signed)
Pt calling again about lab results. She said she saw mychart msg and has completed the antibiotic and still having symptoms. Please call [phone].

## 2015-11-20 NOTE — Telephone Encounter (Signed)
Spoke with pt she states that she is still having brown discharge in urine (they are happening 90% of the time) states that urine is stringy. Still having dysuria, burning and pressure.

## 2015-11-28 ENCOUNTER — Encounter: Payer: Self-pay | Admitting: Family Medicine

## 2015-12-04 ENCOUNTER — Telehealth: Payer: Self-pay | Admitting: Genetic Counselor

## 2015-12-04 NOTE — Telephone Encounter (Signed)
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  Updated information, both internally and externally, have indicated that this variant does not cause the typical presentation of BRIP1 mutations.  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  Patient does not have any questions.

## 2015-12-05 ENCOUNTER — Telehealth: Payer: Self-pay | Admitting: Medical

## 2015-12-05 ENCOUNTER — Ambulatory Visit: Payer: Self-pay | Admitting: Genetic Counselor

## 2015-12-05 DIAGNOSIS — Z1379 Encounter for other screening for genetic and chromosomal anomalies: Secondary | ICD-10-CM

## 2015-12-05 DIAGNOSIS — C50412 Malignant neoplasm of upper-outer quadrant of left female breast: Secondary | ICD-10-CM

## 2015-12-05 DIAGNOSIS — Z8041 Family history of malignant neoplasm of ovary: Secondary | ICD-10-CM

## 2015-12-05 NOTE — Progress Notes (Signed)
HPI: Cassandra Maldonado was previously seen in the [facility] due to a personal history of breast cancer and family history of ovarian cancer raising concerns regarding a hereditary predisposition to cancer. Please refer to our prior cancer genetics clinic note for more information regarding Cassandra Maldonado's medical, social and family histories, and our assessment and recommendations, at the time. Cassandra Maldonado recent genetic test results were disclosed to her, as were recommendations warranted by these results. These results and recommendations are discussed in more detail below.  FAMILY HISTORY:  We obtained a detailed, 4-generation family history.  Significant diagnoses are listed below: Family History  Problem Relation Age of Onset  . Diabetes Brother   . Arthritis Maternal Grandmother   . Lymphoma Mother     dx in her late 50s  . Liver cancer Father 72  . Ovarian cancer Sister 64    primary peritoneal cancer    Cassandra Maldonado has two healthy brothers and one sister who died of ovarian vs. Peritoneal cancer. Her mother was diagnosed with lymphoma at 62 and her father with liver cancer at 72. No other reported cancers in her family.  Patient's maternal ancestors are of Caucasian descent, and paternal ancestors are of Caucasian descent. There is no reported Ashkenazi Jewish ancestry. There is no known consanguinity.  GENETIC TEST RESULTS: At the time of Cassandra Maldonado visit, we recommended she pursue genetic testing of the Breast/Ovarian cancer gene panel. The Breast/Ovarian gene panel offered by [facility] includes sequencing and rearrangement analysis for the following 20 genes:  ATM, BARD1, BRCA1, BRCA2, BRIP1, CDH1, CHEK2, EPCAM, FANCC, MLH1, MSH2, MSH6, NBN, PALB2, PMS2, PTEN, RAD51C, RAD51D, TP53, and XRCC2.   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  The patient declined coming in for genetic counseling based on this result.  Based on additional  literature and internal information, [facility] has reclassified this original result, and now feels that it is a Variant of Uncertain Significance, or a VUS.     We discussed with Cassandra Maldonado that since this test result has been downgraded, we do not feel that her cancer, or the family history of cancer, is explained by this finding.  We also reviewed our discussion that since the current genetic testing is not perfect, it is possible there may be a gene mutation in one of these genes that current testing cannot detect, but that chance is small. We also discussed, that it is possible that another gene that has not yet been discovered, or that we have not yet tested, is responsible for the cancer diagnoses in the family, and it is, therefore, important to remain in touch with cancer genetics in the future so that we can continue to offer Cassandra Maldonado the most up to date genetic testing.   Previous genetic testing did detect a Variant of Unknown Significance in the MSH2 gene called c.815C>T. This is still seen and has not been reclassified. At this time, it is unknown if this variant is associated with increased cancer risk or if this is a normal finding, but most variants such as this get reclassified to being inconsequential. It should not be used to make medical management decisions. With time, we suspect the lab will determine the significance of this variant, if any. If we do learn more about it, we will try to contact Cassandra Maldonado to discuss it further. However, it is important to stay in touch with us periodically and keep the address and   phone number up to date.  CANCER SCREENING RECOMMENDATIONS: This result is reassuring and indicates that Cassandra Maldonado likely does not have an increased risk for a future cancer due to a mutation in one of these genes. This normal test also suggests that Cassandra Maldonado cancer was most likely not due to an inherited predisposition associated with one of these genes.  Most cancers  happen by chance and this negative test suggests that her cancer falls into this category.  We, therefore, recommended she continue to follow the cancer management and screening guidelines provided by her oncology and primary healthcare provider.   RECOMMENDATIONS FOR FAMILY MEMBERS: Women in this family might be at some increased risk of developing cancer, over the general population risk, simply due to the family history of cancer. We recommended women in this family have a yearly mammogram beginning at age 40, or 10 years younger than the earliest onset of cancer, an an annual clinical breast exam, and perform monthly breast self-exams. Women in this family should also have a gynecological exam as recommended by their primary provider. All family members should have a colonoscopy by age 50.  FOLLOW-UP: Lastly, we discussed with Cassandra Maldonado that cancer genetics is a rapidly advancing field and it is possible that new genetic tests will be appropriate for her and/or her family members in the future. We encouraged her to remain in contact with cancer genetics on an annual basis so we can update her personal and family histories and let her know of advances in cancer genetics that may benefit this family.   Our contact number was provided. Cassandra Maldonado's questions were answered to her satisfaction, and she knows she is welcome to call us at anytime with additional questions or concerns.   Karen Powell, MS, CGC Certified Genetic Counselor [email or web address]   

## 2015-12-06 NOTE — Telephone Encounter (Signed)
I reviewed genetic counseling note. I thinks pt is yours. Wanted to make you aware in event she follows you to new practice.

## 2015-12-12 ENCOUNTER — Telehealth: Payer: Self-pay | Admitting: Gastroenterology

## 2015-12-12 ENCOUNTER — Ambulatory Visit (INDEPENDENT_AMBULATORY_CARE_PROVIDER_SITE_OTHER): Payer: Medicare Other | Admitting: Urology

## 2015-12-12 DIAGNOSIS — R3989 Other symptoms and signs involving the genitourinary system: Secondary | ICD-10-CM | POA: Diagnosis not present

## 2015-12-12 DIAGNOSIS — N390 Urinary tract infection, site not specified: Secondary | ICD-10-CM

## 2015-12-12 DIAGNOSIS — N321 Vesicointestinal fistula: Secondary | ICD-10-CM | POA: Diagnosis not present

## 2015-12-12 NOTE — Telephone Encounter (Signed)
I spoke with Dr. Vernie Shanks this evening.  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  Can you contact his office ([facility]) to have them send her office note, CT scan report (i think she had a scan done elsewhere, not visible in epic) and put on my desk for review.  If all looks ok, i'd plan on direct colonoscopy.  Thanks

## 2015-12-13 NOTE — Telephone Encounter (Signed)
I spoke with medical records at [facility] and they will have the records sent over this afternoon and I will put them on your desk as soon as they arrive.

## 2015-12-14 ENCOUNTER — Encounter: Payer: Self-pay | Admitting: *Deleted

## 2015-12-14 NOTE — Progress Notes (Signed)
Received office notes from [facility], reviewed by Dr. Lindi Adie, sent to scan.

## 2015-12-15 NOTE — Telephone Encounter (Signed)
Received records from [facility]. Records placed on Dr. Ardis Hughs desk for review.

## 2015-12-19 NOTE — Telephone Encounter (Signed)
No worries, thanks.

## 2015-12-19 NOTE — Telephone Encounter (Signed)
  [name] it wasn't there before I guess someone cancelled, we got her scheduled.  Thanks.  Pt has been scheduled for colon and previsit.  Cassandra Maldonado [facility] will call pt and notify her of the appt.

## 2015-12-19 NOTE — Telephone Encounter (Signed)
Dr Ardis Hughs your first available in the [facility] is Feb 24.  Do you want to schedule her at [facility]?

## 2015-12-19 NOTE — Telephone Encounter (Signed)
I see an opening on Jan 3rd, 3pm.  Can you check again.

## 2015-12-19 NOTE — Telephone Encounter (Signed)
Spoke to patient. She says she is going to contact [facility] about our availability. She would like to know if there is any way she can be seen sooner.

## 2015-12-19 NOTE — Telephone Encounter (Signed)
Dr. Ardis Hughs reviewed records and has accepted patient. Ok to schedule Direct Colon. Left message for patient to return my call to schedule.

## 2015-12-20 ENCOUNTER — Telehealth: Payer: Self-pay

## 2015-12-20 ENCOUNTER — Ambulatory Visit (AMBULATORY_SURGERY_CENTER): Payer: Self-pay

## 2015-12-20 VITALS — Ht 65.0 in | Wt 205.6 lb

## 2015-12-20 DIAGNOSIS — Z1211 Encounter for screening for malignant neoplasm of colon: Secondary | ICD-10-CM

## 2015-12-20 MED ORDER — SUPREP BOWEL PREP KIT 17.5-3.13-1.6 GM/177ML PO SOLN: 1.0000 | Freq: Once | ORAL | Status: DC

## 2015-12-20 NOTE — Progress Notes (Signed)
No allergies to eggs or soy No diet/weight loss meds No home oxygen No past problems with anesthesia  Has email and internet; registered for emmi

## 2015-12-20 NOTE — Telephone Encounter (Signed)
Finished chemo Sept 25, 2016 for left sided breast cancer.  Port no longer in place.  Oncology told her all is well  If you want anything other than a direct colon please advise.  Thank you, Cassandra Maldonado/PV

## 2015-12-21 NOTE — Telephone Encounter (Signed)
Thank you for checking after noting her recent cancer care; still OK to proceed with direct colonsocopy next week.

## 2015-12-22 ENCOUNTER — Telehealth: Payer: Self-pay | Admitting: Gastroenterology

## 2015-12-22 NOTE — Telephone Encounter (Signed)
Patient states her prep is too expensive. Offered a sample but she states she is too far away and cannot get out of the bathroom long enough to make the trip and her husband will not be off before our office closes to pick it up. She asked if she could do the Miralax prep as other family members have done in the past. She has MyChart active. Suprep instructions deleted and Miralax prep instructions entered for patient to follow.

## 2015-12-26 ENCOUNTER — Encounter: Payer: Self-pay | Admitting: Gastroenterology

## 2015-12-26 ENCOUNTER — Ambulatory Visit (AMBULATORY_SURGERY_CENTER): Payer: Medicare Other | Admitting: Gastroenterology

## 2015-12-26 VITALS — BP 128/76 | HR 66 | Temp 96.9°F | Resp 25 | Ht 65.0 in | Wt 205.0 lb

## 2015-12-26 DIAGNOSIS — N321 Vesicointestinal fistula: Secondary | ICD-10-CM

## 2015-12-26 DIAGNOSIS — Z1211 Encounter for screening for malignant neoplasm of colon: Secondary | ICD-10-CM

## 2015-12-26 MED ORDER — CIPROFLOXACIN HCL 500 MG PO TABS: 500.0000 mg | ORAL_TABLET | Freq: Two times a day (BID) | ORAL | Status: DC

## 2015-12-26 MED ORDER — SODIUM CHLORIDE 0.9 % IV SOLN: 500.0000 mL | INTRAVENOUS | Status: DC

## 2015-12-26 NOTE — Op Note (Addendum)
[place]  [address] [place], [address]   COLONOSCOPY PROCEDURE REPORT  PATIENT: Cassandra Maldonado, Cassandra Maldonado  MR#: [id number] BIRTHDATE: August 14, 1946 , 36  yrs. old GENDER: female ENDOSCOPIST: Milus Banister, MD REFERRED [id number] Dahlstedt, M.D. PROCEDURE DATE:  12/26/2015 PROCEDURE:   Colonoscopy, diagnostic; incomplete colonoscopy First Screening Colonoscopy - Avg.  risk and is 50 yrs.  old or older - No.  Prior Negative Screening - Now for repeat screening. N/A  History of Adenoma - Now for follow-up colonoscopy & has been > or = to 3 yrs.  N/A  Recommend repeat exam, <10 yrs? 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 MEDICATIONS: Monitored anesthesia care and Propofol 200 mg IV  DESCRIPTION OF PROCEDURE:   After the risks benefits and alternatives of the procedure were thoroughly explained, informed consent was obtained.  The digital rectal exam revealed no abnormalities of the rectum.   The LB [id number]  endoscope was introduced through the anus and advanced to the sigmoid colon. No adverse events experienced.   The quality of the prep was excellent.  The instrument was then slowly withdrawn as the colon was fully examined. Estimated blood loss is zero unless otherwise noted in this procedure report.   COLON FINDINGS: There was significant edema, stricturing, tortuosity of the sigmoid colon and I was unable to advance a scope proximal to the sigmoid segment (either adult colonoscope or adult gastroscope).  There were several diverticulum in the distal aspect of the sigmoid colon.  No mucosal tumors were noted to the extent of the exam.  Retroflexed views revealed no abnormalities. The time to cecum = NA Withdrawal time = NA   The scope was withdrawn and the procedure  completed. COMPLICATIONS: There were no immediate complications.  ENDOSCOPIC IMPRESSION: There was significant edema, stricturing, tortuosity of the sigmoid colon and I was unable to advance a scope proximal to the sigmoid segment (either adult colonoscope or adult gastroscope). There were several diverticulum in the distal aspect of the sigmoid colon however no mucosal tumors were noted to the extent of the exam.  RECOMMENDATIONS: I spoke with Dr.  Diona Fanti, he will be arranging general surgical referral.  I will put her on cipro twice daily, indefinitely until surgical repair.  eSigned:  Milus Banister, MD 12/26/2015 3:14 PM Revised: 12/26/2015 3:14 PM

## 2015-12-26 NOTE — Patient Instructions (Signed)
YOU HAD AN ENDOSCOPIC PROCEDURE TODAY AT [facility]:   Refer to the procedure report that was given to you for any specific questions about what was found during the examination.  If the procedure report does not answer your questions, please call your gastroenterologist to clarify.  If you requested that your care partner not be given the details of your procedure findings, then the procedure report has been included in a sealed envelope for you to review at your convenience later.  YOU SHOULD EXPECT: Some feelings of bloating in the abdomen. Passage of more gas than usual.  Walking can help get rid of the air that was put into your GI tract during the procedure and reduce the bloating. If you had a lower endoscopy (such as a colonoscopy or flexible sigmoidoscopy) you may notice spotting of blood in your stool or on the toilet paper. If you underwent a bowel prep for your procedure, you may not have a normal bowel movement for a few days.  Please Note:  You might notice some irritation and congestion in your nose or some drainage.  This is from the oxygen used during your procedure.  There is no need for concern and it should clear up in a day or so.  SYMPTOMS TO REPORT IMMEDIATELY:   Following lower endoscopy (colonoscopy or flexible sigmoidoscopy):  Excessive amounts of blood in the stool  Significant tenderness or worsening of abdominal pains  Swelling of the abdomen that is new, acute  Fever of 100F or higher   For urgent or emergent issues, a gastroenterologist can be reached at any hour by calling [phone].   DIET: Your first meal following the procedure should be a small meal and then it is ok to progress to your normal diet. Heavy or fried foods are harder to digest and may make you feel nauseous or bloated.  Likewise, meals heavy in dairy and vegetables can increase bloating.  Drink plenty of fluids but you should avoid alcoholic beverages for 24  hours.  ACTIVITY:  You should plan to take it easy for the rest of today and you should NOT DRIVE or use heavy machinery until tomorrow (because of the sedation medicines used during the test).    FOLLOW UP: Our staff will call the number listed on your records the next business day following your procedure to check on you and address any questions or concerns that you may have regarding the information given to you following your procedure. If we do not reach you, we will leave a message.  However, if you are feeling well and you are not experiencing any problems, there is no need to return our call.  We will assume that you have returned to your regular daily activities without incident.  If any biopsies were taken you will be contacted by phone or by letter within the next 1-3 weeks.  Please call us at [phone] if you have not heard about the biopsies in 3 weeks.    SIGNATURES/CONFIDENTIALITY: You and/or your care partner have signed paperwork which will be entered into your electronic medical record.  These signatures attest to the fact that that the information above on your After Visit Summary has been reviewed and is understood.  Full responsibility of the confidentiality of this discharge information lies with you and/or your care-partner.  Diverticulosis information given. Cipro twice daily.   Dr. Diona Fanti to be in touch regarding surgical consultation.

## 2015-12-26 NOTE — Progress Notes (Signed)
  [facility] Post-op Note  Patient: Cassandra Maldonado  Procedure(s) Performed: colonoscopy  Patient Location: [facility] - Recovery Area  Anesthesia Type: Deep Sedation/Propofol  Level of Consciousness: awake, oriented and patient cooperative  Airway and Oxygen Therapy: Patient Spontanous Breathing  Post-op Pain: none  Post-op Assessment:  Post-op Vital signs reviewed, Patient's Cardiovascular Status Stable, Respiratory Function Stable, Patent Airway, No signs of Nausea or vomiting and Pain level controlled  Post-op Vital Signs: Reviewed and stable  Complications: No apparent anesthesia complications and patient states she was awake and felt everything. Reassurance given, questions answered  Brooklin Rieger E 2:53 PM

## 2015-12-26 NOTE — Progress Notes (Addendum)
Pt. Voided in bathroom.  Called nurse to observe small amount of blood in toliet.  Urine clear yellow. Dr. Ardis Hughs notified.  No orders given.  Pt. Advised to call if  Bleeding increased.

## 2015-12-27 ENCOUNTER — Telehealth: Payer: Self-pay

## 2015-12-27 NOTE — Telephone Encounter (Signed)
  Follow up Call-  Call back number 12/26/2015  Post procedure Call Back phone  # [phone]  Permission to leave phone message Yes     Patient questions:  Do you have a fever, pain , or abdominal swelling? No. Pain Score  0 *  Have you tolerated food without any problems? Yes.    Have you been able to return to your normal activities? Yes.    Do you have any questions about your discharge instructions: Diet   No. Medications  No. Follow up visit  No.  Do you have questions or concerns about your Care? No.  Actions: * If pain score is 4 or above: No action needed, pain <4.

## 2015-12-29 ENCOUNTER — Other Ambulatory Visit: Payer: Self-pay | Admitting: Surgery

## 2016-01-11 ENCOUNTER — Other Ambulatory Visit: Payer: Self-pay | Admitting: Urology

## 2016-01-13 IMAGING — DX DG CHEST 2V
2 series · 2 of 2 positions shown · non-contrast
Comparison: 03/22/2015.  CT 03/17/2015

CLINICAL DATA: Cough.  Shortness of breath .

EXAM:
CHEST  2 VIEW

[chest pa]
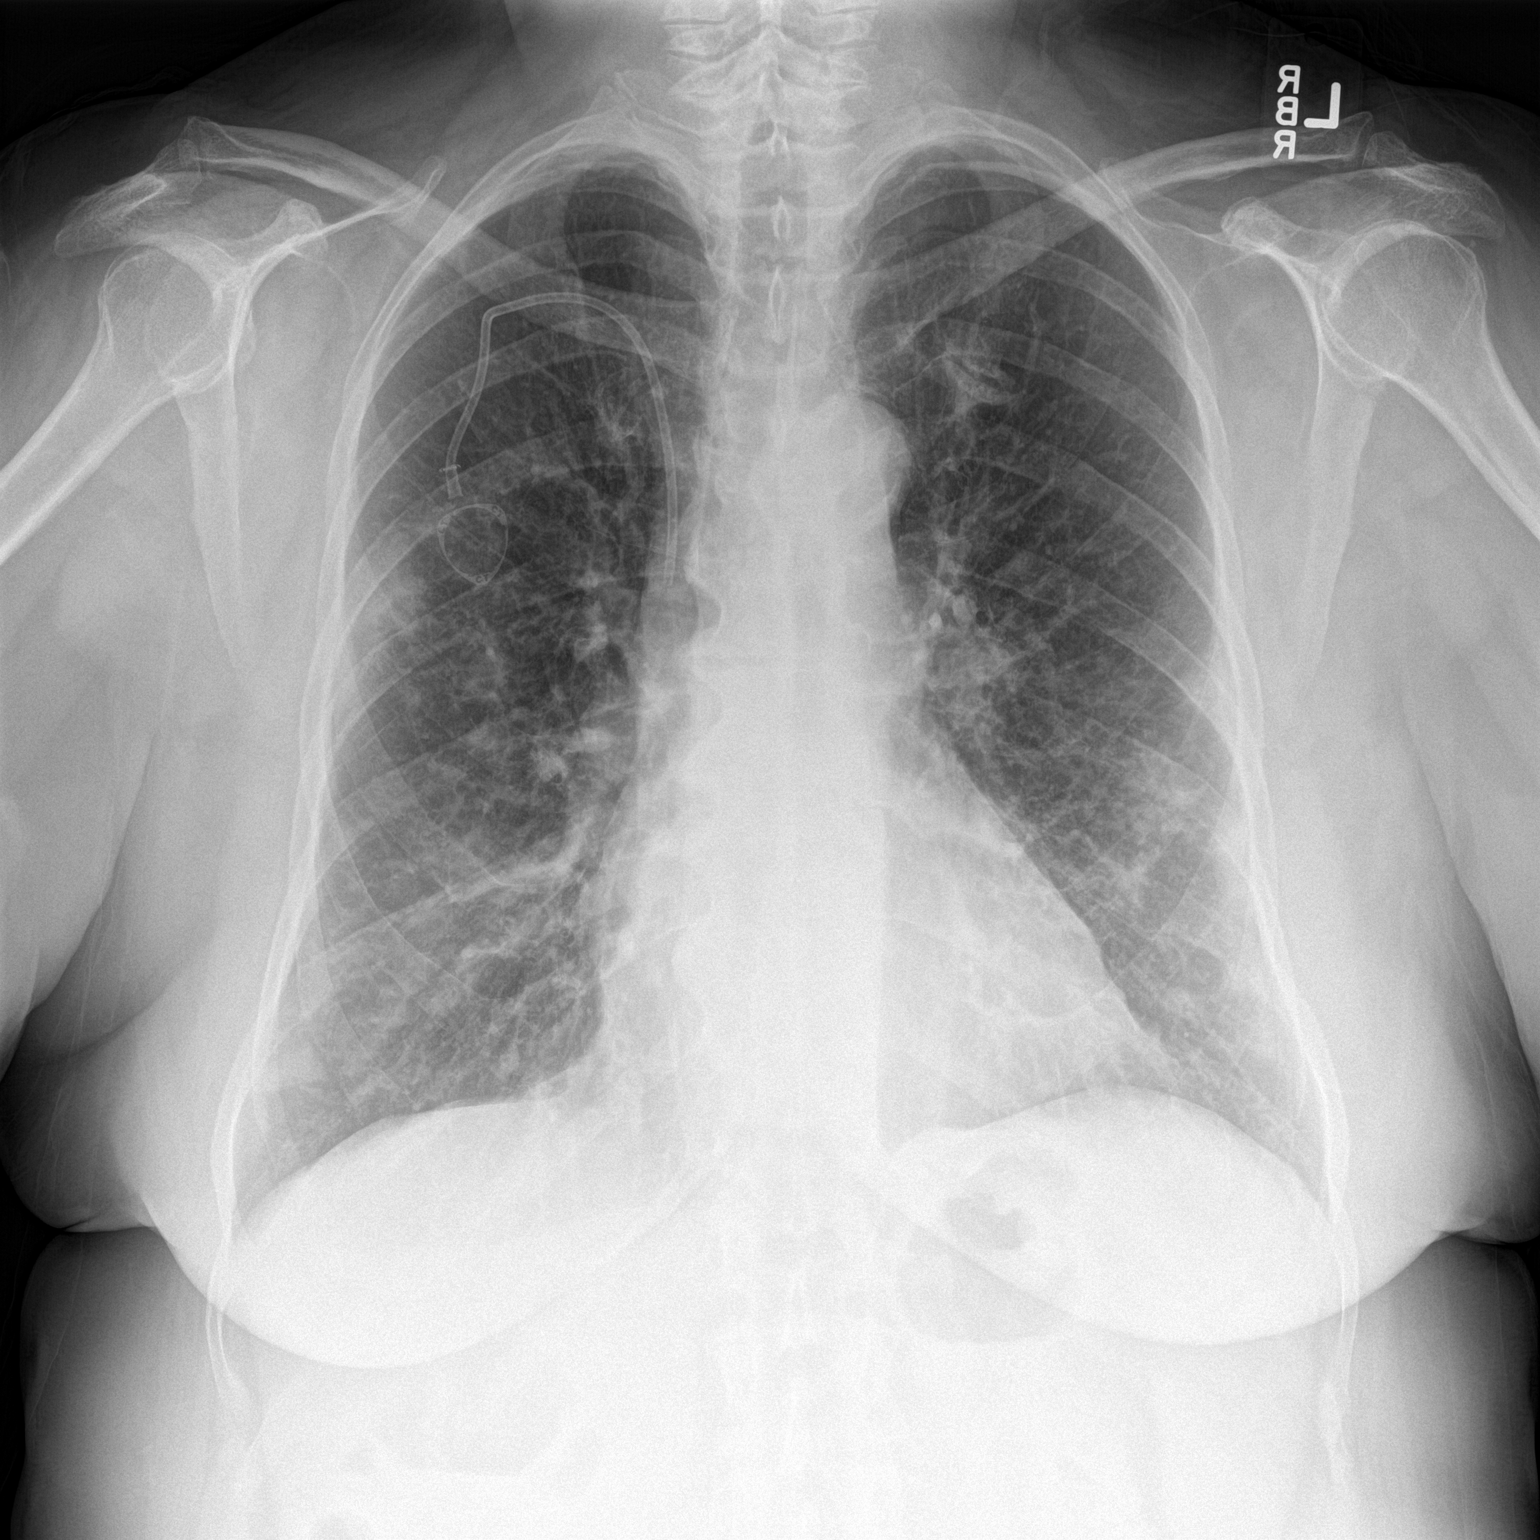

[chest lat]
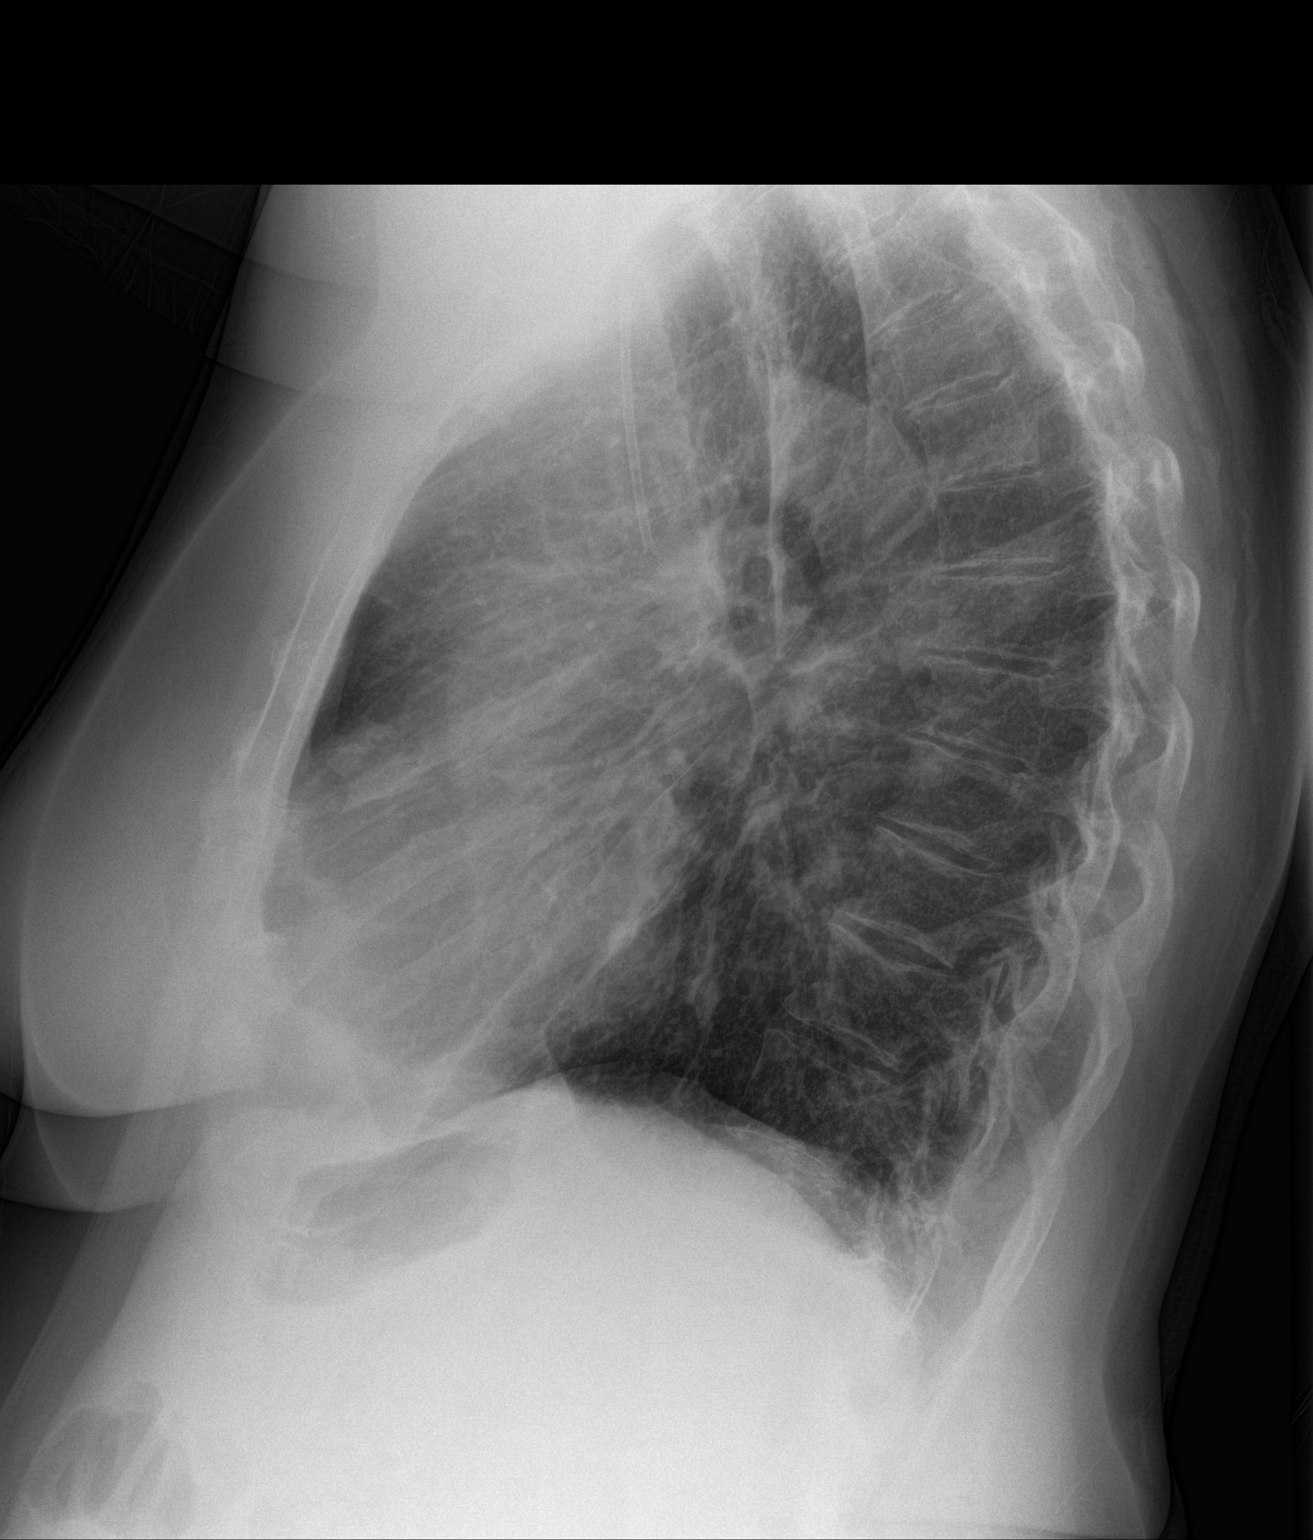

[2 of 2 positions shown; findings below may reference images not displayed]

FINDINGS: Power Port catheter noted with tip in stable position. Mediastinum
and hilar structures are normal. Nodular interstitial prominence
noted bilaterally. These changes may be related to pneumonitis.
These have not cleared from prior exam. Given the absence of a
abnormalities on prior recent CT these are most likely not
metastatic changes. Heart size is stable. Degenerative changes
thoracic spine.
IMPRESSION: Persistent interstitial nodular infiltrates noted suggesting
pneumonitis. No interval clearing.

## 2016-01-18 ENCOUNTER — Other Ambulatory Visit: Payer: Self-pay | Admitting: Urology

## 2016-01-18 NOTE — Patient Instructions (Addendum)
Cassandra Maldonado  01/18/2016   Your procedure is scheduled on: 01/24/2016  Thursday  Report to [facility] Main  Entrance take [place]  elevators to 3rd floor to  [place] at    [place] AM.  Call this number if you have problems the morning of surgery [phone]   Remember: ONLY 1 PERSON MAY GO WITH YOU TO SHORT STAY TO GET  READY MORNING OF [place].  Do not eat food or drink liquids :After Midnight.     Take these medicines the morning of surgery with A SIP OF WATER: NONE                                You may not have any metal on your body including hair pins and              piercings  Do not wear jewelry, make-up, lotions, powders or perfumes, deodorant             Do not wear nail polish.  Do not shave  48 hours prior to surgery.              Do not bring valuables to the hospital. [place].  Contacts, dentures or bridgework may not be worn into surgery.  Leave suitcase in the car. After surgery it may be brought to your room.       Special Instructions: coughing and deep breathing exercises, leg exercises               Please read over the following fact sheets you were given: _____________________________________________________________________             [facility] - Preparing for Surgery Before surgery, you can play an important role.  Because skin is not sterile, your skin needs to be as free of germs as possible.  You can reduce the number of germs on your skin by washing with CHG (chlorahexidine gluconate) soap before surgery.  CHG is an antiseptic cleaner which kills germs and bonds with the skin to continue killing germs even after washing. Please DO NOT use if you have an allergy to CHG or antibacterial soaps.  If your skin becomes reddened/irritated stop using the CHG and inform your nurse when you arrive at Short Stay. Do not shave (including legs and underarms) for at least 48  hours prior to the first CHG shower.  You may shave your face/neck. Please follow these instructions carefully:  1.  Shower with CHG Soap the night before surgery and the  morning of Surgery.  2.  If you choose to wash your hair, wash your hair first as usual with your  normal  shampoo.  3.  After you shampoo, rinse your hair and body thoroughly to remove the  shampoo.                           4.  Use CHG as you would any other liquid soap.  You can apply chg directly  to the skin and wash                       Gently with a scrungie or clean  washcloth.  5.  Apply the CHG Soap to your body ONLY FROM THE NECK DOWN.   Do not use on face/ open                           Wound or open sores. Avoid contact with eyes, ears mouth and genitals (private parts).                       Wash face,  Genitals (private parts) with your normal soap.             6.  Wash thoroughly, paying special attention to the area where your surgery  will be performed.  7.  Thoroughly rinse your body with warm water from the neck down.  8.  DO NOT shower/wash with your normal soap after using and rinsing off  the CHG Soap.                9.  Pat yourself dry with a clean towel.            10.  Wear clean pajamas.            11.  Place clean sheets on your bed the night of your first shower and do not  sleep with pets. Day of Surgery : Do not apply any lotions/deodorants the morning of surgery.  Please wear clean clothes to the hospital/surgery center.  FAILURE TO FOLLOW THESE INSTRUCTIONS MAY RESULT IN THE CANCELLATION OF YOUR SURGERY PATIENT SIGNATURE_________________________________  NURSE SIGNATURE__________________________________  ________________________________________________________________________  WHAT IS A BLOOD TRANSFUSION? Blood Transfusion Information  A transfusion is the replacement of blood or some of its parts. Blood is made up of multiple cells which provide different functions.  Red blood cells  carry oxygen and are used for blood loss replacement.  White blood cells fight against infection.  Platelets control bleeding.  Plasma helps clot blood.  Other blood products are available for specialized needs, such as hemophilia or other clotting disorders. BEFORE THE TRANSFUSION  Who gives blood for transfusions?   Healthy volunteers who are fully evaluated to make sure their blood is safe. This is blood bank blood. Transfusion therapy is the safest it has ever been in the practice of medicine. Before blood is taken from a donor, a complete history is taken to make sure that person has no history of diseases nor engages in risky social behavior (examples are intravenous drug use or sexual activity with multiple partners). The donor's travel history is screened to minimize risk of transmitting infections, such as malaria. The donated blood is tested for signs of infectious diseases, such as HIV and hepatitis. The blood is then tested to be sure it is compatible with you in order to minimize the chance of a transfusion reaction. If you or a relative donates blood, this is often done in anticipation of surgery and is not appropriate for emergency situations. It takes many days to process the donated blood. RISKS AND COMPLICATIONS Although transfusion therapy is very safe and saves many lives, the main dangers of transfusion include:   Getting an infectious disease.  Developing a transfusion reaction. This is an allergic reaction to something in the blood you were given. Every precaution is taken to prevent this. The decision to have a blood transfusion has been considered carefully by your caregiver before blood is given. Blood is not given unless the benefits outweigh the risks. AFTER THE TRANSFUSION  Right after receiving a blood transfusion, you will usually feel much better and more energetic. This is especially true if your red blood cells have gotten low (anemic). The transfusion raises  the level of the red blood cells which carry oxygen, and this usually causes an energy increase.  The nurse administering the transfusion will monitor you carefully for complications. HOME CARE INSTRUCTIONS  No special instructions are needed after a transfusion. You may find your energy is better. Speak with your caregiver about any limitations on activity for underlying diseases you may have. SEEK MEDICAL CARE IF:   Your condition is not improving after your transfusion.  You develop redness or irritation at the intravenous (IV) site. SEEK IMMEDIATE MEDICAL CARE IF:  Any of the following symptoms occur over the next 12 hours:  Shaking chills.  You have a temperature by mouth above 102 F (38.9 C), not controlled by medicine.  Chest, back, or muscle pain.  People around you feel you are not acting correctly or are confused.  Shortness of breath or difficulty breathing.  Dizziness and fainting.  You get a rash or develop hives.  You have a decrease in urine output.  Your urine turns a dark color or changes to pink, red, or brown. Any of the following symptoms occur over the next 10 days:  You have a temperature by mouth above 102 F (38.9 C), not controlled by medicine.  Shortness of breath.  Weakness after normal activity.  The white part of the eye turns yellow (jaundice).  You have a decrease in the amount of urine or are urinating less often.  Your urine turns a dark color or changes to pink, red, or brown. Document Released: 12/06/2000 Document Revised: 03/02/2012 Document Reviewed: 07/25/2008 [facility] Patient Information 2014 [place], [place].  _______________________________________________________________________

## 2016-01-19 ENCOUNTER — Encounter (HOSPITAL_COMMUNITY)
Admission: RE | Admit: 2016-01-19 | Discharge: 2016-01-19 | Disposition: A | Discharge status: Home / Self Care | Payer: Medicare Other | Source: Ambulatory Visit | Attending: Surgery | Admitting: Surgery

## 2016-01-19 ENCOUNTER — Encounter (HOSPITAL_COMMUNITY): Payer: Self-pay

## 2016-01-19 DIAGNOSIS — Z01812 Encounter for preprocedural laboratory examination: Secondary | ICD-10-CM | POA: Diagnosis not present

## 2016-01-19 LAB — CBC
HEMATOCRIT: 36.9 % (ref 36.0–46.0)
HEMOGLOBIN: 11.7 g/dL — AB (ref 12.0–15.0)
MCH: 28.8 pg (ref 26.0–34.0)
MCHC: 31.7 g/dL (ref 30.0–36.0)
MCV: 90.9 fL (ref 78.0–100.0)
Platelets: 252 10*3/uL (ref 150–400)
RBC: 4.06 MIL/uL (ref 3.87–5.11)
RDW: 13.7 % (ref 11.5–15.5)
WBC: 7.4 10*3/uL (ref 4.0–10.5)

## 2016-01-19 LAB — BASIC METABOLIC PANEL
ANION GAP: 7 (ref 5–15)
BUN: 31 mg/dL — ABNORMAL HIGH (ref 6–20)
CHLORIDE: 103 mmol/L (ref 101–111)
CO2: 27 mmol/L (ref 22–32)
CREATININE: 1.01 mg/dL — AB (ref 0.44–1.00)
Calcium: 9.7 mg/dL (ref 8.9–10.3)
GFR calc non Af Amer: 56 mL/min — ABNORMAL LOW (ref >60)
Glucose, Bld: 95 mg/dL (ref 65–99)
POTASSIUM: 5.5 mmol/L — AB (ref 3.5–5.1)
Sodium: 137 mmol/L (ref 135–145)

## 2016-01-19 NOTE — Pre-Procedure Instructions (Addendum)
Echo 03-14-15 epic 2V CXR 05-31-15 epic  Margarita Grizzle, ostomy nurse, to come visit with pt today at pre-op appt.

## 2016-01-19 NOTE — Progress Notes (Signed)
Pt has pre-op appt this morning.  Still need orders from Dr. Diona Fanti for pt. Thank you!

## 2016-01-19 NOTE — Consult Note (Addendum)
[facility] ostomy consult note Patient seen for preoperative stoma site selection as permit states "possible ostomy".  DOS is 01/24/16 with Drs. Gross and Dahlstedt.  Abdomen assessed in the sitting and standing positions, it is noted where she wears her elastic waistband and that she has an umbilical hernia which she hopes to have repaired intraoperatively.  Two sites are provided in the event one is needed for each an ileostomy or a colostomy.  The RUQ site is located 9cm to the right and 2cm above the umbilicus.  The LLQ site is located 9cm to the left of the umbilicus and [id number] below. Abdomen is cleansed x2 with CHG wipes and allowed to dry in-between cleansings. Luetta Nutting are made with a surgical site marking pen and covered with a thin film transparent dressing.  Patient engages this Probation officer in a discussion in which she becomes tearful; relaying the course of her surgery and chemotherapy, her decision to stop the oral chemotherapeutic agent and no have radiation therapy. She is very hopeful she does not have to have an ostomy following surgery next week. [facility] nursing team will not follow, but will remain available to this patient, the nursing and medical teams.  Please re-consult if a stoma is created intraoperatively on 01/24/16. Thanks, Maudie Flakes, MSN, RN, [place], Arther Abbott  Pager# [phone]

## 2016-01-20 LAB — HEMOGLOBIN A1C
Hgb A1c MFr Bld: 5.7 % — ABNORMAL HIGH (ref 4.8–5.6)
Mean Plasma Glucose: 117 mg/dL

## 2016-01-22 NOTE — Progress Notes (Signed)
I have reviewed the tests.  There are no major concerns.  Okay to proceed with surgery from my standpoint. 

## 2016-01-22 NOTE — Progress Notes (Signed)
I have reviewed the tests.  Consider rechecking potassium on day of surgery.  Defer to anesthesia.  There are no other major concerns.

## 2016-01-22 NOTE — Progress Notes (Signed)
Final EKG in EPIC done 01/19/16.  HGA1C done 01/19/16 faxed via EPIC to Dr Johney [place].

## 2016-01-23 ENCOUNTER — Other Ambulatory Visit: Payer: Self-pay | Admitting: Urology

## 2016-01-23 NOTE — Anesthesia Preprocedure Evaluation (Signed)
Anesthesia Evaluation  Patient identified by MRN, date of birth, ID band Patient awake    Reviewed: Allergy & Precautions, H&P , NPO status , Patient's Chart, lab work & pertinent test results  Airway Mallampati: II  TM Distance: >3 FB Neck ROM: Full    Dental no notable dental hx. (+) Upper Dentures, Lower Dentures, Dental Advisory Given   Pulmonary neg pulmonary ROS,    Pulmonary exam normal breath sounds clear to auscultation       Cardiovascular Exercise Tolerance: Good hypertension, Pt. on medications Normal cardiovascular exam+ dysrhythmias  Rhythm:Regular Rate:Normal  Palpitations. Chest pressure. Cardiac arrhythmia due to congenital heart disease. 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  Neuromuscular disease negative neurological ROS  negative psych ROS   GI/Hepatic negative GI ROS, Neg liver ROS,   Endo/Other  negative endocrine ROSMorbid obesity  Renal/GU negative Renal ROS  negative genitourinary   Musculoskeletal   Abdominal   Peds  Hematology negative hematology ROS (+)   Anesthesia Other Findings   Reproductive/Obstetrics negative OB ROS                             Anesthesia Physical Anesthesia Plan  ASA: III  Anesthesia Plan: General   Post-op Pain Management:    Induction: Intravenous  Airway Management Planned: Oral ETT  Additional Equipment:   Intra-op Plan:   Post-operative Plan: Extubation in OR  Informed Consent: I have reviewed the patients History and Physical, chart, labs and discussed the procedure including the risks, benefits and alternatives for the proposed anesthesia with the patient or authorized representative who has indicated his/her understanding and acceptance.   Dental Advisory Given  Plan Discussed with: CRNA and Surgeon  Anesthesia Plan Comments:         Anesthesia Quick Evaluation

## 2016-01-24 ENCOUNTER — Encounter (HOSPITAL_COMMUNITY): Payer: Self-pay | Admitting: *Deleted

## 2016-01-24 ENCOUNTER — Inpatient Hospital Stay (HOSPITAL_COMMUNITY): Payer: Medicare Other | Admitting: Anesthesiology

## 2016-01-24 ENCOUNTER — Encounter (HOSPITAL_COMMUNITY)
Admission: RE | Disposition: A | Discharge status: Home / Self Care | Payer: Self-pay | Source: Ambulatory Visit | Attending: Surgery

## 2016-01-24 ENCOUNTER — Inpatient Hospital Stay (HOSPITAL_COMMUNITY): Payer: Medicare Other

## 2016-01-24 ENCOUNTER — Inpatient Hospital Stay (HOSPITAL_COMMUNITY)
Admission: RE | Admit: 2016-01-24 | Discharge: 2016-01-27 | DRG: 654 | Disposition: A | Discharge status: Home / Self Care | Payer: Medicare Other | Source: Ambulatory Visit | Attending: Surgery | Admitting: Surgery

## 2016-01-24 DIAGNOSIS — T451X5A Adverse effect of antineoplastic and immunosuppressive drugs, initial encounter: Secondary | ICD-10-CM | POA: Diagnosis present

## 2016-01-24 DIAGNOSIS — N321 Vesicointestinal fistula: Secondary | ICD-10-CM | POA: Diagnosis present

## 2016-01-24 DIAGNOSIS — D6481 Anemia due to antineoplastic chemotherapy: Secondary | ICD-10-CM | POA: Diagnosis present

## 2016-01-24 DIAGNOSIS — I10 Essential (primary) hypertension: Secondary | ICD-10-CM | POA: Diagnosis present

## 2016-01-24 DIAGNOSIS — M109 Gout, unspecified: Secondary | ICD-10-CM | POA: Diagnosis present

## 2016-01-24 DIAGNOSIS — Z6835 Body mass index (BMI) 35.0-35.9, adult: Secondary | ICD-10-CM

## 2016-01-24 DIAGNOSIS — Z800 Family history of malignant neoplasm of digestive organs: Secondary | ICD-10-CM | POA: Diagnosis not present

## 2016-01-24 DIAGNOSIS — Z8041 Family history of malignant neoplasm of ovary: Secondary | ICD-10-CM | POA: Diagnosis not present

## 2016-01-24 DIAGNOSIS — Z807 Family history of other malignant neoplasms of lymphoid, hematopoietic and related tissues: Secondary | ICD-10-CM

## 2016-01-24 DIAGNOSIS — C50912 Malignant neoplasm of unspecified site of left female breast: Secondary | ICD-10-CM | POA: Diagnosis present

## 2016-01-24 DIAGNOSIS — E7800 Pure hypercholesterolemia, unspecified: Secondary | ICD-10-CM | POA: Diagnosis present

## 2016-01-24 DIAGNOSIS — Z8619 Personal history of other infectious and parasitic diseases: Secondary | ICD-10-CM

## 2016-01-24 DIAGNOSIS — Z01810 Encounter for preprocedural cardiovascular examination: Secondary | ICD-10-CM | POA: Diagnosis not present

## 2016-01-24 DIAGNOSIS — K660 Peritoneal adhesions (postprocedural) (postinfection): Secondary | ICD-10-CM | POA: Diagnosis present

## 2016-01-24 DIAGNOSIS — K436 Other and unspecified ventral hernia with obstruction, without gangrene: Secondary | ICD-10-CM | POA: Diagnosis present

## 2016-01-24 DIAGNOSIS — L309 Dermatitis, unspecified: Secondary | ICD-10-CM | POA: Diagnosis present

## 2016-01-24 DIAGNOSIS — Z808 Family history of malignant neoplasm of other organs or systems: Secondary | ICD-10-CM | POA: Diagnosis not present

## 2016-01-24 DIAGNOSIS — Z79899 Other long term (current) drug therapy: Secondary | ICD-10-CM

## 2016-01-24 DIAGNOSIS — Z01812 Encounter for preprocedural laboratory examination: Secondary | ICD-10-CM

## 2016-01-24 DIAGNOSIS — Z833 Family history of diabetes mellitus: Secondary | ICD-10-CM

## 2016-01-24 HISTORY — DX: Other specified disorders of Eustachian tube, unspecified ear: H69.80

## 2016-01-24 HISTORY — DX: Acute bronchitis, unspecified: J20.9

## 2016-01-24 HISTORY — DX: Other muscle spasm: M62.838

## 2016-01-24 HISTORY — PX: XI ROBOTIC ASSISTED LOWER ANTERIOR RESECTION: SHX6558

## 2016-01-24 HISTORY — PX: CYSTOSCOPY WITH STENT PLACEMENT: SHX5790

## 2016-01-24 HISTORY — DX: Chest pain, unspecified: R07.9

## 2016-01-24 LAB — TYPE AND SCREEN
ABO/RH(D): A POS
Antibody Screen: NEGATIVE

## 2016-01-24 SURGERY — RESECTION, RECTUM, LOW ANTERIOR, ROBOT-ASSISTED
Anesthesia: General

## 2016-01-24 MED ORDER — PROMETHAZINE HCL 25 MG/ML IJ SOLN: 12.5000 mg | INTRAMUSCULAR | Status: DC | PRN

## 2016-01-24 MED ORDER — PROMETHAZINE HCL 25 MG/ML IJ SOLN: 6.2500 mg | INTRAMUSCULAR | Status: DC | PRN

## 2016-01-24 MED ORDER — SUGAMMADEX SODIUM 200 MG/2ML IV SOLN
INTRAVENOUS | Status: AC
  Filled 2016-01-24: qty 2

## 2016-01-24 MED ORDER — BUPIVACAINE LIPOSOME 1.3 % IJ SUSP
20.0000 mL | INTRAMUSCULAR | Status: DC
  Filled 2016-01-24: qty 20

## 2016-01-24 MED ORDER — DIPHENHYDRAMINE HCL 50 MG/ML IJ SOLN: 12.5000 mg | Freq: Four times a day (QID) | INTRAMUSCULAR | Status: DC | PRN

## 2016-01-24 MED ORDER — PHENAZOPYRIDINE HCL 200 MG PO TABS
200.0000 mg | ORAL_TABLET | Freq: Three times a day (TID) | ORAL | Status: DC | PRN
  Filled 2016-01-24: qty 1

## 2016-01-24 MED ORDER — MIDAZOLAM HCL 5 MG/5ML IJ SOLN
INTRAMUSCULAR | Status: DC | PRN
  Administered 2016-01-24: 2 mg via INTRAVENOUS

## 2016-01-24 MED ORDER — STERILE WATER FOR IRRIGATION IR SOLN
Status: DC | PRN
  Administered 2016-01-24: 300 mL via INTRAVESICAL

## 2016-01-24 MED ORDER — FENTANYL CITRATE (PF) 250 MCG/5ML IJ SOLN
INTRAMUSCULAR | Status: AC
  Filled 2016-01-24: qty 5

## 2016-01-24 MED ORDER — LACTATED RINGERS IV SOLN: INTRAVENOUS | Status: DC

## 2016-01-24 MED ORDER — ENSURE ENLIVE PO LIQD
237.0000 mL | Freq: Two times a day (BID) | ORAL | Status: DC
  Administered 2016-01-25 (×2): 237 mL via ORAL

## 2016-01-24 MED ORDER — METOPROLOL TARTRATE 1 MG/ML IV SOLN
5.0000 mg | Freq: Four times a day (QID) | INTRAVENOUS | Status: DC | PRN
  Filled 2016-01-24: qty 5

## 2016-01-24 MED ORDER — HYDROMORPHONE HCL 1 MG/ML IJ SOLN
INTRAMUSCULAR | Status: DC | PRN
  Administered 2016-01-24 (×2): 1 mg via INTRAVENOUS

## 2016-01-24 MED ORDER — DEXTROSE 5 % IV SOLN
2.0000 g | INTRAVENOUS | Status: AC
  Administered 2016-01-24: 2 g via INTRAVENOUS

## 2016-01-24 MED ORDER — MIDAZOLAM HCL 2 MG/2ML IJ SOLN
INTRAMUSCULAR | Status: AC
Start: 2016-01-24 — End: 2016-01-24
  Filled 2016-01-24: qty 2

## 2016-01-24 MED ORDER — ALVIMOPAN 12 MG PO CAPS
12.0000 mg | ORAL_CAPSULE | Freq: Once | ORAL | Status: AC
  Administered 2016-01-24: 12 mg via ORAL
  Filled 2016-01-24: qty 1

## 2016-01-24 MED ORDER — SODIUM CHLORIDE 0.9 % IJ SOLN
INTRAMUSCULAR | Status: AC
  Filled 2016-01-24: qty 10

## 2016-01-24 MED ORDER — ZOLPIDEM TARTRATE 5 MG PO TABS
5.0000 mg | ORAL_TABLET | Freq: Every evening | ORAL | Status: DC | PRN
  Administered 2016-01-26: 5 mg via ORAL
  Filled 2016-01-24: qty 1

## 2016-01-24 MED ORDER — ALUM & MAG HYDROXIDE-SIMETH 200-200-20 MG/5ML PO SUSP: 30.0000 mL | Freq: Four times a day (QID) | ORAL | Status: DC | PRN

## 2016-01-24 MED ORDER — DEXAMETHASONE SODIUM PHOSPHATE 10 MG/ML IJ SOLN
INTRAMUSCULAR | Status: AC
  Filled 2016-01-24: qty 1

## 2016-01-24 MED ORDER — ROCURONIUM BROMIDE 100 MG/10ML IV SOLN
INTRAVENOUS | Status: DC | PRN
  Administered 2016-01-24: 20 mg via INTRAVENOUS
  Administered 2016-01-24: 60 mg via INTRAVENOUS
  Administered 2016-01-24 (×2): 20 mg via INTRAVENOUS

## 2016-01-24 MED ORDER — PHENOL 1.4 % MT LIQD
2.0000 | OROMUCOSAL | Status: DC | PRN
  Filled 2016-01-24: qty 177

## 2016-01-24 MED ORDER — KETOROLAC TROMETHAMINE 15 MG/ML IJ SOLN
INTRAMUSCULAR | Status: DC | PRN
  Administered 2016-01-24: 15 mg via INTRAVENOUS

## 2016-01-24 MED ORDER — LIP MEDEX EX OINT
1.0000 "application " | TOPICAL_OINTMENT | Freq: Two times a day (BID) | CUTANEOUS | Status: DC
  Administered 2016-01-24 – 2016-01-27 (×4): 1 via TOPICAL
  Filled 2016-01-24 (×2): qty 7

## 2016-01-24 MED ORDER — HYDROMORPHONE HCL 1 MG/ML IJ SOLN
0.5000 mg | INTRAMUSCULAR | Status: DC | PRN
  Filled 2016-01-24: qty 1

## 2016-01-24 MED ORDER — MAGIC MOUTHWASH
15.0000 mL | Freq: Four times a day (QID) | ORAL | Status: DC | PRN
  Filled 2016-01-24: qty 15

## 2016-01-24 MED ORDER — SUGAMMADEX SODIUM 200 MG/2ML IV SOLN
INTRAVENOUS | Status: DC | PRN
  Administered 2016-01-24: 200 mg via INTRAVENOUS

## 2016-01-24 MED ORDER — FENTANYL CITRATE (PF) 100 MCG/2ML IJ SOLN
INTRAMUSCULAR | Status: DC | PRN
  Administered 2016-01-24: 100 ug via INTRAVENOUS
  Administered 2016-01-24 (×4): 50 ug via INTRAVENOUS
  Administered 2016-01-24: 100 ug via INTRAVENOUS
  Administered 2016-01-24 (×2): 50 ug via INTRAVENOUS

## 2016-01-24 MED ORDER — ONDANSETRON HCL 4 MG/2ML IJ SOLN
INTRAMUSCULAR | Status: DC | PRN
  Administered 2016-01-24: 4 mg via INTRAVENOUS

## 2016-01-24 MED ORDER — VITAMIN B-12 100 MCG PO TABS
100.0000 ug | ORAL_TABLET | Freq: Every day | ORAL | Status: DC
  Administered 2016-01-25 – 2016-01-27 (×3): 100 ug via ORAL
  Filled 2016-01-24 (×4): qty 1

## 2016-01-24 MED ORDER — HYDROMORPHONE HCL 2 MG/ML IJ SOLN
INTRAMUSCULAR | Status: AC
  Filled 2016-01-24: qty 1

## 2016-01-24 MED ORDER — CHLORHEXIDINE GLUCONATE 4 % EX LIQD: 60.0000 mL | Freq: Once | CUTANEOUS | Status: DC

## 2016-01-24 MED ORDER — ENOXAPARIN SODIUM 40 MG/0.4ML ~~LOC~~ SOLN
40.0000 mg | Freq: Once | SUBCUTANEOUS | Status: AC
  Administered 2016-01-24: 40 mg via SUBCUTANEOUS
  Filled 2016-01-24: qty 0.4

## 2016-01-24 MED ORDER — METOPROLOL TARTRATE 12.5 MG HALF TABLET
12.5000 mg | ORAL_TABLET | Freq: Two times a day (BID) | ORAL | Status: DC | PRN
  Filled 2016-01-24: qty 1

## 2016-01-24 MED ORDER — MENTHOL 3 MG MT LOZG
1.0000 | LOZENGE | OROMUCOSAL | Status: DC | PRN
  Filled 2016-01-24: qty 9

## 2016-01-24 MED ORDER — 0.9 % SODIUM CHLORIDE (POUR BTL) OPTIME
TOPICAL | Status: DC | PRN
  Administered 2016-01-24: 1000 mL

## 2016-01-24 MED ORDER — SODIUM CHLORIDE 0.9 % IJ SOLN
INTRAMUSCULAR | Status: AC
  Filled 2016-01-24: qty 50

## 2016-01-24 MED ORDER — BUPIVACAINE-EPINEPHRINE 0.25% -1:200000 IJ SOLN
INTRAMUSCULAR | Status: AC
  Filled 2016-01-24: qty 1

## 2016-01-24 MED ORDER — ENOXAPARIN SODIUM 40 MG/0.4ML ~~LOC~~ SOLN
40.0000 mg | SUBCUTANEOUS | Status: DC
Start: 2016-01-25 — End: 2016-01-27
  Administered 2016-01-25 – 2016-01-27 (×3): 40 mg via SUBCUTANEOUS
  Filled 2016-01-24 (×5): qty 0.4

## 2016-01-24 MED ORDER — ALVIMOPAN 12 MG PO CAPS
12.0000 mg | ORAL_CAPSULE | Freq: Two times a day (BID) | ORAL | Status: DC
  Administered 2016-01-25 – 2016-01-26 (×3): 12 mg via ORAL
  Filled 2016-01-24 (×6): qty 1

## 2016-01-24 MED ORDER — LIDOCAINE HCL (CARDIAC) 20 MG/ML IV SOLN
INTRAVENOUS | Status: DC | PRN
  Administered 2016-01-24: 100 mg via INTRAVENOUS

## 2016-01-24 MED ORDER — DIPHENHYDRAMINE HCL 12.5 MG/5ML PO ELIX: 12.5000 mg | ORAL_SOLUTION | Freq: Four times a day (QID) | ORAL | Status: DC | PRN

## 2016-01-24 MED ORDER — EPHEDRINE SULFATE 50 MG/ML IJ SOLN
INTRAMUSCULAR | Status: DC | PRN
  Administered 2016-01-24 (×2): 5 mg via INTRAVENOUS

## 2016-01-24 MED ORDER — ONDANSETRON HCL 4 MG PO TABS: 4.0000 mg | ORAL_TABLET | Freq: Four times a day (QID) | ORAL | Status: DC | PRN

## 2016-01-24 MED ORDER — DEXAMETHASONE SODIUM PHOSPHATE 10 MG/ML IJ SOLN
INTRAMUSCULAR | Status: DC | PRN
  Administered 2016-01-24: 10 mg via INTRAVENOUS

## 2016-01-24 MED ORDER — ONDANSETRON HCL 4 MG/2ML IJ SOLN
INTRAMUSCULAR | Status: AC
  Filled 2016-01-24: qty 2

## 2016-01-24 MED ORDER — LACTATED RINGERS IV BOLUS (SEPSIS): 1000.0000 mL | Freq: Three times a day (TID) | INTRAVENOUS | Status: AC | PRN

## 2016-01-24 MED ORDER — HYDRALAZINE HCL 20 MG/ML IJ SOLN
INTRAMUSCULAR | Status: AC
  Filled 2016-01-24: qty 1

## 2016-01-24 MED ORDER — LACTATED RINGERS IR SOLN
Status: DC | PRN
  Administered 2016-01-24: 1

## 2016-01-24 MED ORDER — METHYLENE BLUE 1 % INJ SOLN
INTRAMUSCULAR | Status: AC
  Filled 2016-01-24: qty 10

## 2016-01-24 MED ORDER — SODIUM CHLORIDE 0.9 % IV SOLN
INTRAVENOUS | Status: DC
  Administered 2016-01-24 – 2016-01-25 (×2): via INTRAVENOUS

## 2016-01-24 MED ORDER — PROPOFOL 10 MG/ML IV BOLUS
INTRAVENOUS | Status: AC
  Filled 2016-01-24: qty 20

## 2016-01-24 MED ORDER — HYDRALAZINE HCL 20 MG/ML IJ SOLN
INTRAMUSCULAR | Status: DC | PRN
  Administered 2016-01-24 (×2): 4 mg via INTRAVENOUS

## 2016-01-24 MED ORDER — ROCURONIUM BROMIDE 100 MG/10ML IV SOLN
INTRAVENOUS | Status: AC
  Filled 2016-01-24: qty 1

## 2016-01-24 MED ORDER — HYDROMORPHONE HCL 1 MG/ML IJ SOLN: 0.2500 mg | INTRAMUSCULAR | Status: DC | PRN

## 2016-01-24 MED ORDER — ANASTROZOLE 1 MG PO TABS
1.0000 mg | ORAL_TABLET | Freq: Every day | ORAL | Status: DC
  Administered 2016-01-25: 1 mg via ORAL
  Filled 2016-01-24 (×4): qty 1

## 2016-01-24 MED ORDER — KETOROLAC TROMETHAMINE 30 MG/ML IJ SOLN
INTRAMUSCULAR | Status: AC
  Filled 2016-01-24: qty 1

## 2016-01-24 MED ORDER — SACCHAROMYCES BOULARDII 250 MG PO CAPS
250.0000 mg | ORAL_CAPSULE | Freq: Two times a day (BID) | ORAL | Status: DC
  Administered 2016-01-24 – 2016-01-27 (×6): 250 mg via ORAL
  Filled 2016-01-24 (×7): qty 1

## 2016-01-24 MED ORDER — BUPIVACAINE LIPOSOME 1.3 % IJ SUSP
INTRAMUSCULAR | Status: DC | PRN
  Administered 2016-01-24: 20 mL

## 2016-01-24 MED ORDER — PROMETHAZINE HCL 25 MG/ML IJ SOLN
INTRAMUSCULAR | Status: AC
  Filled 2016-01-24: qty 1

## 2016-01-24 MED ORDER — SODIUM CHLORIDE 0.9 % IV SOLN
INTRAVENOUS | Status: AC
  Administered 2016-01-24: 1000 mL via INTRAPERITONEAL
  Filled 2016-01-24: qty 6

## 2016-01-24 MED ORDER — ONDANSETRON HCL 4 MG/2ML IJ SOLN
4.0000 mg | Freq: Four times a day (QID) | INTRAMUSCULAR | Status: DC | PRN
  Filled 2016-01-24: qty 2

## 2016-01-24 MED ORDER — CEFOTETAN DISODIUM-DEXTROSE 2-2.08 GM-% IV SOLR
INTRAVENOUS | Status: AC
  Filled 2016-01-24: qty 50

## 2016-01-24 MED ORDER — ACETAMINOPHEN 500 MG PO TABS
1000.0000 mg | ORAL_TABLET | Freq: Three times a day (TID) | ORAL | Status: DC
  Administered 2016-01-24 – 2016-01-27 (×8): 1000 mg via ORAL
  Filled 2016-01-24 (×13): qty 2

## 2016-01-24 MED ORDER — DEXTROSE 5 % IV SOLN
2.0000 g | Freq: Two times a day (BID) | INTRAVENOUS | Status: AC
  Administered 2016-01-24: 2 g via INTRAVENOUS
  Filled 2016-01-24: qty 2

## 2016-01-24 MED ORDER — STERILE WATER FOR IRRIGATION IR SOLN
Status: DC | PRN
Start: 2016-01-24 — End: 2016-01-24
  Administered 2016-01-24: 3000 mL

## 2016-01-24 MED ORDER — LIDOCAINE HCL (CARDIAC) 20 MG/ML IV SOLN
INTRAVENOUS | Status: AC
  Filled 2016-01-24: qty 5

## 2016-01-24 MED ORDER — PROMETHAZINE HCL 25 MG/ML IJ SOLN
6.2500 mg | INTRAMUSCULAR | Status: AC | PRN
  Administered 2016-01-24 (×2): 6.25 mg via INTRAVENOUS

## 2016-01-24 MED ORDER — PROPOFOL 10 MG/ML IV BOLUS
INTRAVENOUS | Status: DC | PRN
  Administered 2016-01-24: 200 mg via INTRAVENOUS

## 2016-01-24 MED ORDER — EPHEDRINE SULFATE 50 MG/ML IJ SOLN
INTRAMUSCULAR | Status: AC
  Filled 2016-01-24: qty 1

## 2016-01-24 MED ORDER — LACTATED RINGERS IV SOLN
INTRAVENOUS | Status: DC | PRN
  Administered 2016-01-24 (×2): via INTRAVENOUS

## 2016-01-24 MED ORDER — BUPIVACAINE-EPINEPHRINE 0.25% -1:200000 IJ SOLN
INTRAMUSCULAR | Status: DC | PRN
  Administered 2016-01-24: 50 mL

## 2016-01-24 MED ORDER — SODIUM CHLORIDE 0.9 % IJ SOLN
INTRAMUSCULAR | Status: DC | PRN
  Administered 2016-01-24: 60 mL

## 2016-01-24 SURGICAL SUPPLY — 114 items
ADAPTER GOLDBERG URETERAL (ADAPTER) ×3 IMPLANT
APPLIER CLIP 5 13 M/L LIGAMAX5 (MISCELLANEOUS)
APPLIER CLIP ROT 10 11.4 M/L (STAPLE)
BAG URO CATCHER STRL LF (MISCELLANEOUS) ×3 IMPLANT
BLADE EXTENDED COATED 6.5IN (ELECTRODE) IMPLANT
BLADE SURG SZ11 CARB STEEL (BLADE) ×3 IMPLANT
CABLE HIGH FREQUENCY MONO STRZ (ELECTRODE) IMPLANT
CANNULA REDUC XI 12-8 STAPL (CANNULA) ×1
CANNULA REDUCER 12-8 DVNC XI (CANNULA) ×2 IMPLANT
CATH KIT ON-Q SILVERSOAK 7.5IN (CATHETERS) IMPLANT
CATH URET 5FR 28IN OPEN ENDED (CATHETERS) ×6 IMPLANT
CELLS DAT CNTRL 66122 CELL SVR (MISCELLANEOUS) IMPLANT
CHLORAPREP W/TINT 26ML (MISCELLANEOUS) ×3 IMPLANT
CLIP APPLIE 5 13 M/L LIGAMAX5 (MISCELLANEOUS) IMPLANT
CLIP APPLIE ROT 10 11.4 M/L (STAPLE) IMPLANT
CLIP LIGATING HEM O LOK PURPLE (MISCELLANEOUS) IMPLANT
CLIP LIGATING HEMO O LOK GREEN (MISCELLANEOUS) IMPLANT
COUNTER NEEDLE 20 DBL MAG RED (NEEDLE) ×3 IMPLANT
COVER MAYO STAND STRL (DRAPES) ×9 IMPLANT
COVER SURGICAL LIGHT HANDLE (MISCELLANEOUS) IMPLANT
COVER TIP SHEARS 8 DVNC (MISCELLANEOUS) ×2 IMPLANT
COVER TIP SHEARS 8MM DA VINCI (MISCELLANEOUS) ×1
DECANTER SPIKE VIAL GLASS SM (MISCELLANEOUS) ×3 IMPLANT
DEVICE TROCAR PUNCTURE CLOSURE (ENDOMECHANICALS) IMPLANT
DRAIN CHANNEL 19F RND (DRAIN) IMPLANT
DRAPE ARM DVNC X/XI (DISPOSABLE) ×8 IMPLANT
DRAPE COLUMN DVNC XI (DISPOSABLE) ×2 IMPLANT
DRAPE DA VINCI XI ARM (DISPOSABLE) ×4
DRAPE DA VINCI XI COLUMN (DISPOSABLE) ×1
DRAPE SURG IRRIG POUCH 19X23 (DRAPES) ×3 IMPLANT
DRAPE WARM FLUID 44X44 (DRAPE) IMPLANT
DRSG OPSITE POSTOP 4X10 (GAUZE/BANDAGES/DRESSINGS) IMPLANT
DRSG OPSITE POSTOP 4X6 (GAUZE/BANDAGES/DRESSINGS) ×3 IMPLANT
DRSG OPSITE POSTOP 4X8 (GAUZE/BANDAGES/DRESSINGS) IMPLANT
DRSG TEGADERM 2-3/8X2-3/4 SM (GAUZE/BANDAGES/DRESSINGS) ×12 IMPLANT
DRSG TEGADERM 4X4.75 (GAUZE/BANDAGES/DRESSINGS) IMPLANT
ELECT PENCIL ROCKER SW 15FT (MISCELLANEOUS) ×3 IMPLANT
ELECT REM PT RETURN 9FT ADLT (ELECTROSURGICAL) ×3
ELECTRODE REM PT RTRN 9FT ADLT (ELECTROSURGICAL) ×2 IMPLANT
ENDOLOOP SUT PDS II  0 18 (SUTURE)
ENDOLOOP SUT PDS II 0 18 (SUTURE) IMPLANT
EVACUATOR SILICONE 100CC (DRAIN) IMPLANT
GAUZE SPONGE 2X2 8PLY STRL LF (GAUZE/BANDAGES/DRESSINGS) ×2 IMPLANT
GAUZE SPONGE 4X4 12PLY STRL (GAUZE/BANDAGES/DRESSINGS) IMPLANT
GLOVE BIOGEL M 8.0 STRL (GLOVE) ×3 IMPLANT
GLOVE ECLIPSE 8.0 STRL XLNG CF (GLOVE) ×9 IMPLANT
GLOVE INDICATOR 8.0 STRL GRN (GLOVE) ×9 IMPLANT
GOWN STRL REUS W/TWL XL LVL3 (GOWN DISPOSABLE) ×18 IMPLANT
GUIDEWIRE STR DUAL SENSOR (WIRE) ×3 IMPLANT
KIT PROCEDURE DA VINCI SI (MISCELLANEOUS)
KIT PROCEDURE DVNC SI (MISCELLANEOUS) IMPLANT
LEGGING LITHOTOMY PAIR STRL (DRAPES) ×3 IMPLANT
LUBRICANT JELLY K Y 4OZ (MISCELLANEOUS) ×3 IMPLANT
MARKER SKIN DUAL TIP RULER LAB (MISCELLANEOUS) ×3 IMPLANT
NEEDLE INSUFFLATION 14GA 120MM (NEEDLE) ×3 IMPLANT
PACK CARDIOVASCULAR III (CUSTOM PROCEDURE TRAY) ×3 IMPLANT
PACK COLON (CUSTOM PROCEDURE TRAY) ×3 IMPLANT
PACK CYSTO (CUSTOM PROCEDURE TRAY) ×3 IMPLANT
PORT LAP GEL ALEXIS MED 5-9CM (MISCELLANEOUS) ×3 IMPLANT
RTRCTR WOUND ALEXIS 18CM MED (MISCELLANEOUS)
SCISSORS LAP 5X35 DISP (ENDOMECHANICALS) ×3 IMPLANT
SCRUB PCMX 4 OZ (MISCELLANEOUS) ×3 IMPLANT
SEAL CANN UNIV 5-8 DVNC XI (MISCELLANEOUS) ×6 IMPLANT
SEAL XI 5MM-8MM UNIVERSAL (MISCELLANEOUS) ×3
SEALER VESSEL DA VINCI XI (MISCELLANEOUS)
SEALER VESSEL EXT DVNC XI (MISCELLANEOUS) IMPLANT
SET BI-LUMEN FLTR TB AIRSEAL (TUBING) ×3 IMPLANT
SET TUBE IRRIG SUCTION NO TIP (IRRIGATION / IRRIGATOR) ×3 IMPLANT
SLEEVE XCEL OPT CAN 5 100 (ENDOMECHANICALS) IMPLANT
SOLUTION ELECTROLUBE (MISCELLANEOUS) ×3 IMPLANT
SPONGE GAUZE 2X2 STER 10/PKG (GAUZE/BANDAGES/DRESSINGS) ×1
STAPLER 45 BLU RELOAD XI (STAPLE) ×4 IMPLANT
STAPLER 45 BLUE RELOAD XI (STAPLE) ×2
STAPLER 45 GREEN RELOAD XI (STAPLE)
STAPLER 45 GRN RELOAD XI (STAPLE) IMPLANT
STAPLER CANNULA SEAL DVNC XI (STAPLE) ×2 IMPLANT
STAPLER CANNULA SEAL XI (STAPLE) ×1
STAPLER CIRC ILS CVD 33MM 37CM (STAPLE) ×3 IMPLANT
STAPLER SHEATH (SHEATH) ×1
STAPLER SHEATH ENDOWRIST DVNC (SHEATH) ×2 IMPLANT
SUT MNCRL AB 4-0 PS2 18 (SUTURE) ×3 IMPLANT
SUT PDS AB 1 CTX 36 (SUTURE) ×6 IMPLANT
SUT PDS AB 1 TP1 96 (SUTURE) IMPLANT
SUT PDS AB 2-0 CT2 27 (SUTURE) IMPLANT
SUT PROLENE 0 CT 2 (SUTURE) ×3 IMPLANT
SUT PROLENE 2 0 SH DA (SUTURE) IMPLANT
SUT SILK 2 0 (SUTURE) ×1
SUT SILK 2 0 SH CR/8 (SUTURE) ×3 IMPLANT
SUT SILK 2-0 18XBRD TIE 12 (SUTURE) ×2 IMPLANT
SUT SILK 3 0 (SUTURE) ×1
SUT SILK 3 0 SH CR/8 (SUTURE) ×3 IMPLANT
SUT SILK 3-0 18XBRD TIE 12 (SUTURE) ×2 IMPLANT
SUT V-LOC BARB 180 2/0GR6 GS22 (SUTURE) ×3
SUT VIC AB 2-0 SH 27 (SUTURE) ×2
SUT VIC AB 2-0 SH 27X BRD (SUTURE) ×4 IMPLANT
SUT VIC AB 2-0 UR6 27 (SUTURE) ×3 IMPLANT
SUT VIC AB 3-0 SH 18 (SUTURE) IMPLANT
SUT VIC AB 3-0 SH 27 (SUTURE)
SUT VIC AB 3-0 SH 27XBRD (SUTURE) IMPLANT
SUT VICRYL 0 UR6 27IN ABS (SUTURE) ×3 IMPLANT
SUT VLOC 180 2-0 9IN GS21 (SUTURE) IMPLANT
SUTURE V-LC BRB 180 2/0GR6GS22 (SUTURE) ×2 IMPLANT
SYRINGE 10CC LL (SYRINGE) ×3 IMPLANT
SYS LAPSCP GELPORT 120MM (MISCELLANEOUS)
SYSTEM LAPSCP GELPORT 120MM (MISCELLANEOUS) IMPLANT
TAPE UMBILICAL COTTON 1/8X30 (MISCELLANEOUS) ×3 IMPLANT
TOWEL OR 17X26 10 PK STRL BLUE (TOWEL DISPOSABLE) ×3 IMPLANT
TOWEL OR NON WOVEN STRL DISP B (DISPOSABLE) IMPLANT
TRAY FOLEY W/METER SILVER 14FR (SET/KITS/TRAYS/PACK) ×3 IMPLANT
TRAY FOLEY W/METER SILVER 16FR (SET/KITS/TRAYS/PACK) IMPLANT
TROCAR BLADELESS OPT 5 100 (ENDOMECHANICALS) ×3 IMPLANT
TUBE CONNECTING VINYL 14FR 30C (MISCELLANEOUS) ×3 IMPLANT
TUBING CONNECTING 10 (TUBING) ×3 IMPLANT
TUNNELER SHEATH ON-Q 16GX12 DP (PAIN MANAGEMENT) IMPLANT

## 2016-01-24 NOTE — H&P (Signed)
Cassandra Maldonado 12/29/2015 10:29 AM Location: [facility] Patient #: [id number] DOB: 1946-12-15 Married / Language: English / Race: White Female   History of Present Illness Adin Hector MD; 12/29/2015 1:18 PM) The patient is a 70 year old female who presents with colovesical fistula. Note for "Colovesical fistula": Patient sent by Dr. Lester Kinsman with [facility]. Concern for feculent urine output and probable colovesical fistula.  Pleasant female. She comes today with her husband. Recently he sunderwent neoadjuvant chemotherapy and breast conservation surgery for breast cancer. Followed by Dr. Christie Beckers in our group. Complete pathologic response. She completed post-adjuvant chemotherapy in September. She has refused radiation. On hormonal suppression therapy.   Patient noticed urinary discomfort and suspected urinary tract infection. Started on antibiotics by DrgUdena with [facility]. Was not better after 10 days of ciprofloxacin. Discussed with her primary care physician. Different antibiotics given. Not improved. Was sent to urology. Had further workup. CT scan suspicious for gas in bladder. Patient also noted flecks in urine occasionally brown. Some hematuria. Cystoscopy showed evidence of inflammation. Biopsy showing some inflamed cells but no definite cancer. Suspicion for colovesical fistula. Underwent attempted colonoscopy. Rectum clear but could not get proximal to the mid colon suspicious for stricture or torsion. Surgical consultation requested. Patient sent to me given my colorectal focus and her breast surgeon being overbooked at this time. Patient wished to be seen as soon as possible.  Patient is eating okay. No nausea or vomiting. Sometimes she feels like she gets more feculent output from her urethra than her anus. No severe constipation or diarrhea. Tolerating solids. Has irritation and discomfort with urination but no severe  pain. No nausea or vomiting. No lower abdominal pain.   Allergies Elbert Ewings, CMA; 12/29/2015 10:29 AM) No Known Drug Allergies03/14/2016  Medication History Elbert Ewings, CMA; 12/29/2015 10:30 AM) Anastrozole (1MG  Tablet, Oral) Active. Cipro (500MG  Tablet, Oral) Active. Pyridium (200MG  Tablet, Oral) Active. Probiotic Product (Oral) Active. Medications Reconciled  Vitals Elbert Ewings CMA; 12/29/2015 10:31 AM) 12/29/2015 10:31 AM Weight: 204 lb Height: 65in Body Surface Area: 1.99 m Body Mass Index: 33.95 kg/m  Temp.: 45F(Temporal)  Pulse: 68 (Regular)  BP: 130/70 (Sitting, Left Arm, Standard)       Physical Exam Adin Hector MD; 12/29/2015 11:13 AM) General Mental Status-Alert. General Appearance-Not in acute distress, Not Sickly. Orientation-Oriented X3. Hydration-Well hydrated. Voice-Normal.  Integumentary Global Assessment Upon inspection and palpation of skin surfaces of the - Axillae: non-tender, no inflammation or ulceration, no drainage. and Distribution of scalp and body hair is normal. General Characteristics Temperature - normal warmth is noted.  Head and Neck Head-normocephalic, atraumatic with no lesions or palpable masses. Face Global Assessment - atraumatic, no absence of expression. Neck Global Assessment - no abnormal movements, no bruit auscultated on the right, no bruit auscultated on the left, no decreased range of motion, non-tender. Trachea-midline. Thyroid Gland Characteristics - non-tender.  Eye Eyeball - Left-Extraocular movements intact, No Nystagmus. Eyeball - Right-Extraocular movements intact, No Nystagmus. Cornea - Left-No Hazy. Cornea - Right-No Hazy. Sclera/Conjunctiva - Left-No scleral icterus, No Discharge. Sclera/Conjunctiva - Right-No scleral icterus, No Discharge. Pupil - Left-Direct reaction to light normal. Pupil - Right-Direct reaction to light normal.  ENMT Ears Pinna  - Left - no drainage observed, no generalized tenderness observed. Right - no drainage observed, no generalized tenderness observed. Nose and Sinuses External Inspection of the Nose - no destructive lesion observed. Inspection of the nares - Left - quiet respiration. Right - quiet  respiration. Mouth and Throat Lips - Upper Lip - no fissures observed, no pallor noted. Lower Lip - no fissures observed, no pallor noted. Nasopharynx - no discharge present. Oral Cavity/Oropharynx - Tongue - no dryness observed. Oral Mucosa - no cyanosis observed. Hypopharynx - no evidence of airway distress observed.  Chest and Lung Exam Inspection Movements - Normal and Symmetrical. Accessory muscles - No use of accessory muscles in breathing. Palpation Palpation of the chest reveals - Non-tender. Auscultation Breath sounds - Normal and Clear.  Cardiovascular Auscultation Rhythm - Regular. Murmurs & Other Heart Sounds - Auscultation of the heart reveals - No Murmurs and No Systolic Clicks.  Abdomen Inspection Inspection of the abdomen reveals - No Visible peristalsis and No Abnormal pulsations. Umbilicus - No Bleeding, No Urine drainage. Palpation/Percussion Palpation and Percussion of the abdomen reveal - Soft, Non Tender, No Rebound tenderness, No Rigidity (guarding) and No Cutaneous hyperesthesia. Note: Morbidly obese but soft. 7 x 6 x 6 cm. Umbilical mass with a much more narrow stalk consistent with incarcerated periumbilical hernia.   Female Genitourinary Sexual Maturity Tanner 5 - Adult hair pattern. Note: No vaginal bleeding nor discharge   Rectal Note: Deferred given recent colonoscopy. Perianal region clear.   Peripheral Vascular Upper Extremity Inspection - Left - No Cyanotic nailbeds, Not Ischemic. Right - No Cyanotic nailbeds, Not Ischemic.  Neurologic Neurologic evaluation reveals -normal attention span and ability to concentrate, able to name objects and repeat phrases.  Appropriate fund of knowledge , normal sensation and normal coordination. Mental Status Affect - not angry, not paranoid. Cranial Nerves-Normal Bilaterally. Gait-Normal.  Neuropsychiatric Mental status exam performed with findings of-able to articulate well with normal speech/language, rate, volume and coherence, thought content normal with ability to perform basic computations and apply abstract reasoning and no evidence of hallucinations, delusions, obsessions or homicidal/suicidal ideation.  Musculoskeletal Global Assessment Spine, Ribs and Pelvis - no instability, subluxation or laxity. Right Upper Extremity - no instability, subluxation or laxity.  Lymphatic Head & Neck  General Head & Neck Lymphatics: Bilateral - Description - No Localized lymphadenopathy. Axillary  General Axillary Region: Bilateral - Description - No Localized lymphadenopathy. Femoral & Inguinal  Generalized Femoral & Inguinal Lymphatics: Left - Description - No Localized lymphadenopathy. Right - Description - No Localized lymphadenopathy.    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 Most likely diverticular given the normal CT scan 10 months ago but cancer possibility. Lack of definite cancer seen on cystoscopy argues against bladder etiology.  Standard of care would be segmental colonic resection with probable partial bladder wall resection and reconstruction. The fact that she has moderate feculent output makes the hole probably of decent size. I will discuss with Dr. Eulogio Ditch with urology. I think this would be a good situation to do stenting preoperatively especially since bladder repair. Likely. If it is small hole I can primarily repair versus ask their group's involvement.  Good idea to stay on antibiotics until surgery happens.  Patient and husband strongly wished to have this done as soon as  possible. I think that is reasonable. We will work to fit it in this month. ENCOUNTER FOR PREOPERATIVE EXAMINATION FOR GENERAL SURGICAL PROCEDURE (Z01.818) Current Plans You are being scheduled for surgery - Our schedulers will call you.  You should hear from our office's scheduling department within 5 working days about the location, date, and time of surgery. We try to make accommodations for patient's preferences  in scheduling surgery, but sometimes the OR schedule or the surgeon's schedule prevents [place] from making those accommodations.  If you have not heard from our office [phone]) in 5 working days, call the office and ask for your surgeon's nurse.  If you have other questions about your diagnosis, plan, or surgery, call the office and ask for your surgeon's nurse.  Written instructions provided Pt Education - [facility] Colon Bowel Prep 2015 Miralax/Antibiotics Pt Education - [facility] Colectomy post-op instructions: discussed with patient and provided information. Pt Education - Pamphlet Given - Laparoscopic Colorectal Surgery: discussed with patient and provided information. Pt Education - [facility] Good Bowel Health (Gleen Ripberger) Pt Education - [facility] Pain Control ([place]) Started Neomycin Sulfate 500MG , 2 (two) Tablet SEE NOTE, #6, 12/29/2015, No Refill. Local Order: TAKE TWO TABLETS AT 2 PM, 3 PM, AND 10 PM THE DAY PRIOR TO SURGERY Started Flagyl 500MG , 2 (two) Tablet SEE NOTE, #6, 12/29/2015, No Refill. Local Order: Take at 2pm, 3pm, and 10pm the day prior to your colon operation INCARCERATED UMBILICAL HERNIA ([id number]) Impression: Periumbilical hernia incarcerated with fat. Most likely omentum. We'll try and reduce and extract the colon through that and primarily repair. Most likely will not be able to leave it in situ  Adin Hector, M.D., F.A.C.S. Gastrointestinal and Minimally Invasive Surgery [facility] [address] [place], [place] [address] [phone]  Main / Paging

## 2016-01-24 NOTE — Anesthesia Procedure Notes (Signed)
Procedure Name: Intubation Date/Time: 01/24/2016 10:52 AM Performed by: Glory Buff Pre-anesthesia Checklist: Patient identified, Emergency Drugs available, Suction available and Patient being monitored Patient Re-evaluated:Patient Re-evaluated prior to inductionOxygen Delivery Method: Circle System Utilized Preoxygenation: Pre-oxygenation with 100% oxygen Intubation Type: IV induction Ventilation: Mask ventilation without difficulty Laryngoscope Size: Miller and 3 Grade View: Grade I Tube type: Oral Tube size: 7.5 mm Number of attempts: 1 Airway Equipment and Method: Stylet and Oral airway Placement Confirmation: ETT inserted through vocal cords under direct vision,  positive ETCO2 and breath sounds checked- equal and bilateral Secured at: 21 cm Tube secured with: Tape Dental Injury: Teeth and Oropharynx as per pre-operative assessment

## 2016-01-24 NOTE — Interval H&P Note (Signed)
History and Physical Interval Note:  01/24/2016 10:29 AM  Cassandra Maldonado  has presented today for surgery, with the diagnosis of COLOVESICAL FISTULA BETWEEN BLADDER AND COLON, INCARCERATED VENTRAL WALL HERNIA  The various methods of treatment have been discussed with the patient and family. After consideration of risks, benefits and other options for treatment, the patient has consented to  Procedure(s): XI ROBOTIC ASSISTED LOWER ANTERIOR RESECTION, REPAIR OF COLOVESICAL FISTULA. PRIMARY REPAIR OF VENTRAL WALL HERNIA (N/A) CYSTO WITH BLADDER BIOPSY, PLACEMENT OF BILATERAL URETERAL CATHETERS (Bilateral) as a surgical intervention .  The patient's history has been reviewed, patient examined, no change in status, stable for surgery.  I have reviewed the patient's chart and labs.  Questions were answered to the patient's satisfaction.     [name] C.

## 2016-01-24 NOTE — Interval H&P Note (Signed)
History and Physical Interval Note:  01/24/2016 10:32 AM  Cassandra Maldonado  has presented today for surgery, with the diagnosis of COLOVESICAL FISTULA BETWEEN BLADDER AND COLON, INCARCERATED VENTRAL WALL HERNIA  The various methods of treatment have been discussed with the patient and family. After consideration of risks, benefits and other options for treatment, the patient has consented to  Procedure(s): XI ROBOTIC ASSISTED LOWER ANTERIOR RESECTION, REPAIR OF COLOVESICAL FISTULA. PRIMARY REPAIR OF VENTRAL WALL HERNIA (N/A) CYSTO WITH BLADDER BIOPSY, PLACEMENT OF BILATERAL URETERAL CATHETERS (Bilateral) as a surgical intervention .  The patient's history has been reviewed, patient examined, no change in status, stable for surgery.  I have reviewed the patient's chart and labs.  Questions were answered to the patient's satisfaction.     Cassandra Maldonado C.

## 2016-01-24 NOTE — Op Note (Signed)
Preoperative diagnosis: Colovesical fistula, rule out malignancy  Postoperative diagnosis: Same   Procedure: Cystoscopy, bilateral ureteral catheter placement, ladder biopsy    Surgeon: Lillette Boxer. Kanchan Gal, M.D.   Anesthesia: Gen.   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 She does have a history of breast cancer, and has completed therapy with no evidence of recurrence. The patient is significantly symptomatic with this colovesical fistula. She presents at this time for surgical management, including colectomy. Dr. Johney Maine has requested placement of bilateral ureteral catheters for assistance with her colectomy, to avoid ureteral injury. Additionally, she will have biopsy of a polypoid structure adjacent to the fistula.    Technique and findings: The patient was properly identified in the holding area. She received preoperative cefotetan and. She was taken to the operating room where general endotracheal anesthetic was administered. She was placed in the dorsolithotomy position. Genitalia and perineum were prepped and draped. Proper timeout was performed.  Using a 21 French panendoscope, the bladder was circumferentially inspected. Ureteral orifices were noted to be in normal position and were of normal configuration. There was a polypoid mass adjacent to the colovesical fistula which was located superiorly and posteriorly on the bladder, to the left of the midline. No other urothelial lesions were noted. The polypoid mass did appear reminiscent of intestinal mucosa.  Bilateral 6 French ureteral catheters were placed, using open-ended catheters, with a sensor-tip guidewire assisting with placement. Fluoroscopic guidance was used, and  the proximal tip of each catheter was noted to be in the renal pelvic areas. The ureteral catheters were left in place. I then used a cold cup biopsy forceps to take a couple of bites of the polypoid tissue adjacent to the fistula. These were sent for frozen section labeled "bladder lesion". No significant bleeding was seen at this point. The bladder was then drained. The scope was removed, and a 16 French Foley catheter was placed.  The ureteral catheters were tied to the Foley catheter, using 30 silks. There were then drained using a Goldenberg catheter adapter.  At this point, I completed my procedure. Dr. Johney Maine then commenced with his part of the procedure.

## 2016-01-24 NOTE — Op Note (Signed)
01/24/2016  2:56 PM  PATIENT:  Cassandra Maldonado  70 y.o. female  Patient Care Team: Midge Minium, MD as PCP - General (Family Medicine) Mackie Pai, PA-C as Physician Assistant (Physician Assistant) Michael Boston, MD as Consulting Physician (General Surgery) Franchot Gallo, MD as Consulting Physician (Urology) Milus Banister, MD as Attending Physician (Gastroenterology) Nicholas Lose, MD as Consulting Physician (Hematology and Oncology)  PRE-OPERATIVE DIAGNOSIS:  COLOVESICAL FISTULA BETWEEN BLADDER AND COLON, INCARCERATED VENTRAL WALL HERNIA  POST-OPERATIVE DIAGNOSIS:    COLOVESICAL FISTULA BETWEEN BLADDER AND COLON INCARCERATED VENTRAL WALL HERNIA   PROCEDURE:    XI ROBOTIC ASSISTED SIGMOID COLECTOMY Robotic lysis of adhesions 60 minutes REPAIR OF COLOVESICAL FISTULA.  PRIMARY REPAIR OF VENTRAL WALL HERNIA   SURGEON:  Michael Boston, MD  ASSISTANT: Leighton Ruff, MD   ANESTHESIA:   local and general  EBL:  Total I/O In: 1000 [I.V.:1000] Out: 170 [Urine:170]  Delay start of Pharmacological VTE agent (>24hrs) due to surgical blood loss or risk of bleeding:  no  DRAINS: none   SPECIMEN:  Source of Specimen:    DISPOSITION OF SPECIMEN:  PATHOLOGY  COUNTS:  YES  PLAN OF CARE: Admit to inpatient   PATIENT DISPOSITION:  PACU - hemodynamically stable.  INDICATION:    Patient with pelvic pain and intermittent pneumaturia progressing to feculent urine.  Suspicious for colovesical fistula between bladder and colon.  Prior colonoscopy showed no evidence of cancer.  Frozen biopsy of bladder wall by Dr. Diona Fanti via cystoscopy consistent with inflammation and not cancer.  I recommended segmental resection of the colon with repair of the bladder.:  The anatomy & physiology of the digestive tract was discussed.  The pathophysiology was discussed.  Natural history risks without surgery was discussed.   I worked to give an overview of the disease and the frequent  need to have multispecialty involvement.  I feel the risks of no intervention will lead to serious problems that outweigh the operative risks; therefore, I recommended a partial colectomy to remove the pathology.  Laparoscopic & open techniques were discussed.    Risks such as bleeding, infection, abscess, leak, reoperation, possible ostomy, hernia, heart attack, death, and other risks were discussed.  I noted a good likelihood this will help address the problem.   Goals of post-operative recovery were discussed as well.  We will work to minimize complications.  Educational materials on the pathology had been given in the office.  Questions were answered.    The patient expressed understanding & wished to proceed with surgery.  OR FINDINGS:   Patient had torturous thickened inflamed sigmoid colon.  Dense adhesions to the bladder and vaginal cuff and bilateral adnexa.  Basically the anterior pelvic wall.  3mm hole between colon and bladder consistent with colovesical fistula.  No obvious metastatic disease on visceral parietal peritoneum or liver.  The fistula on the bladder was at the dome slightly left of midline.  Closed in 2 layers with interrupted 2-0 Vicryl suture and running V-lock absorbable suture.  The anastomosis rests 15 cm from the anal verge by rigid proctoscopy.  Primary periumbilical ventral hernia, 4 x 4 centimeter incarcerated with omentum.  Colon extracted through there with the help of a wound protector.  Repair primary with #1 running PDS  DESCRIPTION:   Informed consent was confirmed.  The patient had already underwent general anaesthesia without difficulty.  The patient and guardian had bilateral ureteral stents placed by cystoscopy by Dr. Diona Fanti.  See his note.  He did  note an obvious heaped up mass on the dome of the bladder by cystoscopy consistent with the fistula location.  Biopsy had been done.  Frozen section showed inflammation and no obvious cancer.  The patient  was positioned appropriately.  VTE prevention in place.  The patient's abdomen was clipped, prepped, & draped in a sterile fashion.  Surgical timeout confirmed our plan.  The patient was positioned in reverse Trendelenburg.  Abdominal entry was gained using Varess technique with a trach hook on the anterior abdominal wall fascia in the right upper abdomen.  Entry was clean.  I induced carbon dioxide insufflation.  Camera inspection revealed no injury.  Extra ports were carefully placed under direct laparoscopic visualization.   Patient had moderate volume of omentum incarcerated periumbilical hernia.  Able to gradually reduce that down using laparoscopic lysis of adhesions as well as some manual counterpressure.  Omentum looked viable.  I reflected the greater omentum and the upper abdomen the small bowel in the upper abdomen.  The patient was carefully positioned.  The Intuitive daVinci robot was carefully docked with camera & instruments carefully placed.  The patient had sigmoid colon densely adherent to the anterior pelvis.  Quite torturous and twisted upon itself.  We carefully mobilized the sigmoid colon rectosigmoid colon off its adhesions to the anterior pelvis.  Came down to the distal anterior pelvis to help free some of the softer adhesions until it came to more of a taper and at most dense inflammation on the dome of the bladder.  This took some time as there were a lot of pelvic adhesions.  Came across the colovesical fistula.  We could see the inside of the bladder with the ureteral stents and bladder balloon at the neck.  We are far away from it.  Freddrick March some more adhesions and also freed off some inter-loop adhesions to help straighten up the very corkscrew torturous rectosigmoid colon.  Of note the adnexa were rather adherent to this region.  The right side was densely involved with the dome of the bladder and vaginal cuff.  Left side less so.  With that freed off, proceeded with sigmoid  resection.  I scored the base of peritoneum of the medial side of the mesentery of the left colon from the ligament of Treitz to the peritoneal reflection of the mid rectum.   I elevated the sigmoid mesentery and entered into the retro-mesenteric plane. We were able to identify the left ureter and gonadal vessels.  Ureter more firm consistent with stent placement.  We kept those posterior within the retroperitoneum and elevated the left colon mesentery off that. I did isolate the inferior mesenteric artery (IMA) pedicle but did not ligate it yet.  I continued distally and got into the avascular plane posterior to the mesorectum. This allowed me to help mobilize the rectum as well by freeing the mesorectum off the sacrum.  I mobilized the peritoneal coverings towards the peritoneal reflection on both the right and left sides of the rectum.  I stayed away from the right and left ureters.  I kept the lateral vascular pedicles to the rectum intact.  I skeletonized the lymph nodes off the inferior mesenteric artery pedicle.  I went down to its takeoff from the aorta.  I isolated the inferior mesenteric vein off of the ligament of Treitz just cephalad to that as well.  After confirming the left ureter was out of the way, I went ahead and ligated the inferior mesenteric artery pedicle 3cm proximal  from its takeoff from the aorta.  I did ligate the inferior mesenteric vein in a similar fashion.  We ensured hemostasis. I skeletonized the mesorectum at the junction at the proximal rectum for the distal point of resection.  I mobilized the left colon in a lateral to medial fashion off the line of Toldt up towards the splenic flexure to ensure good mobilization of the remaining left colon to reach into the pelvis.  I skeletonized at the proximal mesorectum and transected at the proximal rectum using a robotic 45 mm stapler x 1 fire.  I chose a region at the descending/sigmoid junction that was soft and easily reached down  to the rectal stump.  I transected the mesentery of the colon radially to preserve remaining colon blood supply.  I then primarily repaired the hole in the bladder from the colovesical fistula.  I did a 2-0 Vicryl interrupted sutures transversely.  Then did a running V-lock 2-0 suture on an outer thicker layer as well.  We then distended the bladder with methylene blue bladder irrigation fluid.  Got excellent distention without any evidence of leak.  Bladder decompressed.  I placed a wound protector through the ventral hernia via a transverse infraumbilical incision.  I was able to eviscerate the rectosigmoid and descending colon out the wound.   I clamped the colon proximal to this area using a soft bowel clamp. I transected at the descending/sigmoid junction with a scalpel. I got healthy bleeding mucosa.  We sent the rectosigmoid colon specimen off to go to pathology.  We sized the colon orifice.  I chose a 33 EEA anvil stapler system. I placed the anvil to the open end of the proximal remaining colon and closed around it using a 0 Prolene pursestring.  We did copious irrigation with crystalloid solution.  Hemostasis was good.  The distal end of the remaining colon easily reached down to the rectal stump, therefore, splenic flexure mobilization was not needed.      Dr [name] scrubbed down and did gentle anal dilation and advanced the EEA stapler up the rectal stump. The spike was brought out at the provimal end of the rectal stump under direct visualization.  I attached the anvil of the proximal colon the spike of the stapler. Anvil was tightened down and held clamped for 60 seconds. The EEA stapler was fired and held clamped for 30 seconds. The stapler was released & removed. We noted 2 excellent anastomotic rings. Blue stitch is in the proximal ring.  Dr [name] did rigid proctoscopy noted the anastomosis was at 15 cm from the anal verge consistent with the proximal rectum.  We did a final irrigation of  antibiotic solution (900 mg clindamycin/240 mg gentamicin in a liter of crystalloid) & held that for the pelvic air leak test .  The rectum was insufflated the rectum while clamping the colon proximal to that anastomosis.  There was a negative air leak test. There was no tension of mesentery or bowel at the anastomosis.   Tissues looked viable.  Ureters & bowel uninjured.  The anastomosis looked healthy.  I closed the 12 mm stapler port with a 0 Vicryl suture under laparoscopic visualization with a suture passer.  Endoluminal gas was evacuated.  Ports & wound protector removed.  We changed gloves.  We aspirated the antibiotic irrigation.  Hemostasis was good.  Sterile unused instruments were used from this point out per colon SSI prevention protocol.  I closed the 34mm port sites using Monocryl stitch  and sterile dressing.  I closed the ventral hernia transversely using #1 PDS transverse running fascial closure. I closed the skin with some interrupted Monocryl stitches. I placed antibiotic-soaked wicks into the closure at the corners. I placed a sterile dressing.    Patient is being extubated go to recovery room. I had discussed postop care with the patient in detail the office & in the holding area. Instructions are written.  I'm about to locate family and discuss it with them as well.  Adin Hector, M.D., F.A.C.S. Gastrointestinal and Minimally Invasive Surgery [facility] [address] [place], [place] [address] [phone] Main / Paging

## 2016-01-24 NOTE — Transfer of Care (Signed)
Immediate Anesthesia Transfer of Care Note  Patient: Cassandra Maldonado  Procedure(s) Performed: Procedure(s): XI ROBOTIC ASSISTED sigmoid colectomy REPAIR OF COLOVESICAL FISTULA. PRIMARY REPAIR OF VENTRAL WALL HERNIA (N/A) CYSTO WITH BLADDER BIOPSY, PLACEMENT OF BILATERAL URETERAL CATHETERS (Bilateral)  Patient Location: PACU  Anesthesia Type:General  Level of Consciousness: awake, alert , oriented and patient cooperative  Airway & Oxygen Therapy: Patient Spontanous Breathing and Patient connected to face mask oxygen  Post-op Assessment: Report given to RN, Post -op Vital signs reviewed and stable and Patient moving all extremities X 4  Post vital signs: stable  Last Vitals:  Filed Vitals:   01/24/16 1514 01/24/16 1515  BP:  151/83  Pulse: 80 79  Temp:  36.6 C  Resp: 16 24    Complications: No apparent anesthesia complications

## 2016-01-24 NOTE — Anesthesia Postprocedure Evaluation (Signed)
Anesthesia Post Note  Patient: Cassandra Maldonado  Procedure(s) Performed: Procedure(s) (LRB): XI ROBOTIC ASSISTED sigmoid colectomy REPAIR OF COLOVESICAL FISTULA. PRIMARY REPAIR OF VENTRAL WALL HERNIA (N/A) CYSTO WITH BLADDER BIOPSY, PLACEMENT OF BILATERAL URETERAL CATHETERS (Bilateral)  Patient location during evaluation: PACU Anesthesia Type: General Level of consciousness: awake and alert Pain management: pain level controlled Vital Signs Assessment: post-procedure vital signs reviewed and stable Respiratory status: spontaneous breathing, nonlabored ventilation, respiratory function stable and patient connected to nasal cannula oxygen Cardiovascular status: blood pressure returned to baseline and stable Postop Assessment: no signs of nausea or vomiting Anesthetic complications: no    Last Vitals:  Filed Vitals:   01/24/16 1545 01/24/16 1600  BP: 153/95 157/94  Pulse: 81 87  Temp: 36.6 C   Resp: 19 23    Last Pain:  Filed Vitals:   01/24/16 1603  PainSc: 0-No pain                 Zanden Colver L

## 2016-01-24 NOTE — H&P (View-Only) (Signed)
I have reviewed the tests.  Consider rechecking potassium on day of surgery.  Defer to anesthesia.  There are no other major concerns.

## 2016-01-24 NOTE — H&P (Cosign Needed)
H&P  Chief Complaint: colovesical fistula  History of Present Illness: Cassandra Maldonado is a 70 y.o. year old female who presents at this time for robotic colectomy and closure of a colovesical fistula.  She presented with pneumaturia and fecal contents in her urine several weeks ago.  Evaluation revealed the patient have a colovesical fistula.  A small amount of tissue was biopsied here in the office, and came back as atypia but nondiagnostic.  She presents this time for surgical management of probable diverticulitis with a colovesical fistula today.  Dr. Johney Maine, her general surgeon, has asked that we place ureteral catheters preoperatively.  I will do this cystoscopically, as well as perform a biopsy.  Past Medical History  Diagnosis Date  . Chest pressure   . Palpitations   . Obesity   . Chicken pox   . Breast cancer, left breast ([place])   . Family history of ovarian cancer   . Full dentures   . Wears glasses   . 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 Hypercholesterolemia     Patient denies    Past Surgical History  Procedure Laterality Date  . Retinal detachment surgery  2006    rt  . Vaginal hysterectomy    . Cesarean section  1980  . Hemorroidectomy    . Portacath placement N/A 03/16/2015    Procedure: INSERTION PORT-A-CATH;  Surgeon: Rolm Bookbinder, MD;  Location: [facility];  Service: General;  Laterality: N/A;  . Radioactive seed guided mastectomy with axillary sentinel lymph node biopsy Left 09/12/2015    Procedure: LEFT BREAST RADIOACTIVE SEED LOCALIZED PARTILA MASTECTOMY WITH SENTINEL LYMPH NODE MAPPING AND PORT REMOVAL;  Surgeon: Erroll Luna, MD;  Location: [facility];  Service: General;  Laterality: Left;  . Port-a-cath removal Right 09/12/2015    Procedure: REMOVAL PORT-A-CATH;  Surgeon: Erroll Luna, MD;  Location: [facility];  Service: General;  Laterality: Right;    Home Medications:  No  prescriptions prior to admission    Allergies: No Known Allergies  Family History  Problem Relation Age of Onset  . Diabetes Brother   . Arthritis Maternal Grandmother   . Lymphoma Mother     dx in her late 71s  . Liver cancer Father 23  . Ovarian cancer Sister 61    primary peritoneal cancer  . Colon cancer Neg Hx     Social History:  reports that she has never smoked. She has never used smokeless tobacco. She reports that she does not drink alcohol or use illicit drugs.  ROS: A complete review of systems was performed.  All systems are negative except for pertinent findings as noted.  Physical Exam:  Vital signs in last 24 hours:   General:  Alert and oriented, No acute distress HEENT: Normocephalic, atraumatic Neck: No JVD or lymphadenopathy Cardiovascular: Regular rate and rhythm Lungs: Clear bilaterally Abdomen: Soft, nontender, nondistended, no abdominal masses Back: No CVA tenderness Extremities: No edema Neurologic: Grossly intact  Laboratory Data:  No results found for this or any previous visit (from the past 24 hour(s)). No results found for this or any previous visit (from the past 240 hour(s)). Creatinine:  Recent Labs  01/19/16 0905  CREATININE 1.01*    Radiologic Imaging: No results found.  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 Kniyah Khun  MD   

## 2016-01-25 ENCOUNTER — Encounter (HOSPITAL_COMMUNITY): Payer: Self-pay | Admitting: Surgery

## 2016-01-25 LAB — BASIC METABOLIC PANEL
ANION GAP: 8 (ref 5–15)
BUN: 27 mg/dL — ABNORMAL HIGH (ref 6–20)
CALCIUM: 8.5 mg/dL — AB (ref 8.9–10.3)
CO2: 24 mmol/L (ref 22–32)
Chloride: 106 mmol/L (ref 101–111)
Creatinine, Ser: 1.23 mg/dL — ABNORMAL HIGH (ref 0.44–1.00)
GFR calc Af Amer: 51 mL/min — ABNORMAL LOW (ref >60)
GFR, EST NON AFRICAN AMERICAN: 44 mL/min — AB (ref >60)
GLUCOSE: 133 mg/dL — AB (ref 65–99)
POTASSIUM: 4 mmol/L (ref 3.5–5.1)
SODIUM: 138 mmol/L (ref 135–145)

## 2016-01-25 LAB — CBC
HCT: 28.7 % — ABNORMAL LOW (ref 36.0–46.0)
HEMOGLOBIN: 9.5 g/dL — AB (ref 12.0–15.0)
MCH: 29.4 pg (ref 26.0–34.0)
MCHC: 33.1 g/dL (ref 30.0–36.0)
MCV: 88.9 fL (ref 78.0–100.0)
Platelets: 211 10*3/uL (ref 150–400)
RBC: 3.23 MIL/uL — ABNORMAL LOW (ref 3.87–5.11)
RDW: 13.8 % (ref 11.5–15.5)
WBC: 9 10*3/uL (ref 4.0–10.5)

## 2016-01-25 LAB — MAGNESIUM: MAGNESIUM: 1.7 mg/dL (ref 1.7–2.4)

## 2016-01-25 MED ORDER — DEXTROSE 50 % IV SOLN
INTRAVENOUS | Status: AC
  Filled 2016-01-25: qty 50

## 2016-01-25 MED ORDER — OXYCODONE HCL 5 MG PO TABS: 5.0000 mg | ORAL_TABLET | ORAL | Status: AC | PRN

## 2016-01-25 NOTE — Progress Notes (Signed)
[facility]  [address] [place], [place] [address] Phone: [phone] FAX: [place] [phone] Sep 03, 1946   Assessment  RECOVERING  Problem List:   Principal Problem:   Colovesical fistula s/p robotic colectomy/bladder repair 01/24/2016 Active Problems:   HTN (hypertension)   Gout   Eczema   Anemia associated with chemotherapy   1 Day Post-Op  01/24/2016  POST-OPERATIVE DIAGNOSIS:   COLOVESICAL FISTULA BETWEEN BLADDER AND COLON INCARCERATED VENTRAL WALL HERNIA   PROCEDURE:   XI ROBOTIC ASSISTED SIGMOID COLECTOMY Robotic lysis of adhesions 60 minutes REPAIR OF COLOVESICAL FISTULA.  PRIMARY REPAIR OF VENTRAL WALL HERNIA   SURGEON: Michael Boston, MD  ASSISTANT: Leighton Ruff, MD     Plan:  -adv diet -foley to stay in 7 days minimum with cystogram POD#7 - if no leak, then remove -HTN control -mild inc Cr but good UOP - no NSAIDs & follow -VTE prophylaxis- SCDs, etc -mobilize as tolerated to help recovery  I updated the patient's status to the patient.  Recommendations were made.  Questions were answered.  The patient expressed understanding & appreciation.   Adin Hector, M.D., F.A.C.S. Gastrointestinal and Minimally Invasive Surgery [facility] [address] [place], [place] [address] [phone] Main / Paging   01/25/2016  Subjective:  Denies pain Incont BM small last night w flatus Hungry  Objective:  Vital signs:  Filed Vitals:   01/24/16 2114 01/24/16 2117 01/25/16 0202 01/25/16 0504  BP: 163/88 153/91 154/76 125/78  Pulse: 84 90 69 66  Temp:   97.9 F (36.6 C) 97.9 F (36.6 C)  TempSrc:   Oral Oral  Resp: 18 18 16 14   Height:      Weight:      SpO2: 98% 98% 98% 100%    Last BM Date: 01/24/16  Intake/Output   Yesterday:  02/01 0701 - 02/02 0700 In: 4031.3 [P.O.:600; I.V.:3431.3] Out: 1420 [Urine:1420] This  shift:  Total I/O In: 1500 [P.O.:600; I.V.:900] Out: 900 [Urine:900]  Bowel function:  Flatus: y  BM: small  Drain: foley - serosanguinous  Physical Exam:  General: Pt awake/alert/oriented x4 in no acute distress Eyes: PERRL, normal EOM.  Sclera clear.  No icterus Neuro: CN II-XII intact w/o focal sensory/motor deficits. Lymph: No head/neck/groin lymphadenopathy Psych:  No delerium/psychosis/paranoia HENT: Normocephalic, Mucus membranes moist.  No thrush Neck: Supple, No tracheal deviation Chest: No chest wall pain w good excursion CV:  Pulses intact.  Regular rhythm MS: Normal AROM mjr joints.  No obvious deformity Abdomen: Soft.  Nondistended.  Mildly tender at incisions only.  No evidence of peritonitis.  No incarcerated hernias. Ext:  SCDs BLE.  No mjr edema.  No cyanosis Skin: No petechiae / purpura  Results:   Labs: Results for orders placed or performed during the hospital encounter of 01/24/16 (from the past 48 hour(s))  Basic metabolic panel     Status: Abnormal   Collection Time: 01/25/16  4:26 AM  Result Value Ref Range   Sodium 138 135 - 145 mmol/L   Potassium 4.0 3.5 - 5.1 mmol/L   Chloride 106 101 - 111 mmol/L   CO2 24 22 - 32 mmol/L   Glucose, Bld 133 (H) 65 - 99 mg/dL   BUN 27 (H) 6 - 20 mg/dL   Creatinine, Ser 1.23 (H) 0.44 - 1.00 mg/dL   Calcium 8.5 (L) 8.9 - 10.3 mg/dL   GFR calc non Af Amer 44 (L) >60  mL/min   GFR calc Af Amer 51 (L) >60 mL/min    Comment: (NOTE) The eGFR has been calculated using the CKD EPI equation. This calculation has not been validated in all clinical situations. eGFR's persistently <60 mL/min signify possible Chronic Kidney Disease.    Anion gap 8 5 - 15  CBC     Status: Abnormal   Collection Time: 01/25/16  4:26 AM  Result Value Ref Range   WBC 9.0 4.0 - 10.5 K/uL   RBC 3.23 (L) 3.87 - 5.11 MIL/uL   Hemoglobin 9.5 (L) 12.0 - 15.0 g/dL   HCT 28.7 (L) 36.0 - 46.0 %   MCV 88.9 78.0 - 100.0 fL   MCH 29.4 26.0 - 34.0 pg    MCHC 33.1 30.0 - 36.0 g/dL   RDW 13.8 11.5 - 15.5 %   Platelets 211 150 - 400 K/uL  Magnesium     Status: None   Collection Time: 01/25/16  4:26 AM  Result Value Ref Range   Magnesium 1.7 1.7 - 2.4 mg/dL    Imaging / Studies: Dg C-arm 1-60 Min-no Report  01/24/2016  CLINICAL DATA: surgery C-ARM 1-60 MINUTES Fluoroscopy was utilized by the requesting physician.  No radiographic interpretation.    Medications / Allergies: per chart  Antibiotics: Anti-infectives    Start     Dose/Rate Route Frequency Ordered Stop   01/24/16 2200  cefoTEtan (CEFOTAN) 2 g in dextrose 5 % 50 mL IVPB     2 g 100 mL/hr over 30 Minutes Intravenous Every 12 hours 01/24/16 1650 01/24/16 2138   01/24/16 1230  clindamycin (CLEOCIN) 900 mg, gentamicin (GARAMYCIN) 240 mg in sodium chloride 0.9 % 1,000 mL for intraperitoneal lavage      Intraperitoneal To Surgery 01/24/16 1150 01/24/16 1415   01/24/16 0853  cefoTEtan (CEFOTAN) 2 g in dextrose 5 % 50 mL IVPB     2 g 100 mL/hr over 30 Minutes Intravenous On call to O.R. 01/24/16 0853 01/24/16 1055        Note: Portions of this report may have been transcribed using voice recognition software. Every effort was made to ensure accuracy; however, inadvertent computerized transcription errors may be present.   Any transcriptional errors that result from this process are unintentional.     Adin Hector, M.D., F.A.C.S. Gastrointestinal and Minimally Invasive Surgery [facility] [address] [place], [place] [address] [phone] Main / Paging   01/25/2016  CARE TEAM:  PCP: Annye Asa, MD  Outpatient Care Team: Patient Care Team: Midge Minium, MD as PCP - General (Family Medicine) Mackie Pai, PA-C as Physician Assistant (Physician Assistant) Michael Boston, MD as Consulting Physician (General Surgery) Franchot Gallo, MD as Consulting Physician (Urology) Milus Banister, MD as Attending Physician  (Gastroenterology) Nicholas Lose, MD as Consulting Physician (Hematology and Oncology)  Inpatient Treatment Team: Treatment Team: Attending Provider: Michael Boston, MD; Technician: Leda Quail, NT; Registered Nurse: Vicente Serene, RN

## 2016-01-26 MED ORDER — SODIUM CHLORIDE 0.9% FLUSH: 3.0000 mL | INTRAVENOUS | Status: DC | PRN

## 2016-01-26 MED ORDER — SODIUM CHLORIDE 0.9% FLUSH
3.0000 mL | Freq: Two times a day (BID) | INTRAVENOUS | Status: DC
  Administered 2016-01-26 – 2016-01-27 (×2): 3 mL via INTRAVENOUS

## 2016-01-26 MED ORDER — SODIUM CHLORIDE 0.9 % IV SOLN: 250.0000 mL | INTRAVENOUS | Status: DC | PRN

## 2016-01-26 MED ORDER — OXYCODONE HCL 5 MG PO TABS
5.0000 mg | ORAL_TABLET | ORAL | Status: DC | PRN
  Administered 2016-01-26 – 2016-01-27 (×5): 10 mg via ORAL
  Filled 2016-01-26 (×5): qty 2

## 2016-01-26 NOTE — Progress Notes (Signed)
[facility]  [address] [place], [place] [address] Phone: [phone] FAX: [place] [phone] 19-Feb-1946   Assessment  RECOVERING  Problem List:   Principal Problem:   Colovesical fistula s/p robotic colectomy/bladder repair 01/24/2016 Active Problems:   HTN (hypertension)   Gout   Eczema   Anemia associated with chemotherapy   2 Days Post-Op  01/24/2016  POST-OPERATIVE DIAGNOSIS:   COLOVESICAL FISTULA BETWEEN BLADDER AND COLON INCARCERATED VENTRAL WALL HERNIA   PROCEDURE:   XI ROBOTIC ASSISTED SIGMOID COLECTOMY Robotic lysis of adhesions 60 minutes REPAIR OF COLOVESICAL FISTULA.  PRIMARY REPAIR OF VENTRAL WALL HERNIA   SURGEON: Michael Boston, MD  ASSISTANT: Leighton Ruff, MD     Plan:  -adv diet to solids -try PO pain control -wean IVF -foley to stay in 7 days minimum with cystogram POD#7 - if no leak, then remove -HTN control -mild inc Cr but good UOP - no NSAIDs & follow -VTE prophylaxis- SCDs, etc -mobilize as tolerated to help recovery  D/C patient from hospital when patient meets criteria (anticipate in 1-2 day(s)):  Tolerating oral intake well Ambulating well Adequate pain control without IV medications Urinating  Having flatus Disposition planning in place   I updated the patient's status to the patient.  Recommendations were made.  Questions were answered.  The patient expressed understanding & appreciation.   Adin Hector, M.D., F.A.C.S. Gastrointestinal and Minimally Invasive Surgery [facility] [address] [place], [place] [address] [phone] Main / Paging   01/26/2016  Subjective:  Mild abd pain  Walking in hallways a little Hungry  Objective:  Vital signs:  Filed Vitals:   01/25/16 1400 01/25/16 1800 01/25/16 2146 01/26/16 0507  BP: 130/66 122/70 133/75 149/77  Pulse: 70 66 79 76  Temp: 98.1 F  (36.7 C) 98.6 F (37 C) 98.2 F (36.8 C) 98.3 F (36.8 C)  TempSrc: Oral Oral Oral Oral  Resp: 18 18 18 20   Height:      Weight:   95.981 kg (211 lb 9.6 oz)   SpO2: 100% 100%  98%    Last BM Date: 01/25/16  Intake/Output   Yesterday:  02/02 0701 - 02/03 0700 In: 2130 [P.O.:1080; I.V.:1050] Out: 0355 [Urine:4275] This shift:     Bowel function:  Flatus: y  BM: small  Drain: foley - clear yellow w occ blood flecks - improved  Physical Exam:  General: Pt awake/alert/oriented x4 in no acute distress Eyes: PERRL, normal EOM.  Sclera clear.  No icterus Neuro: CN II-XII intact w/o focal sensory/motor deficits. Lymph: No head/neck/groin lymphadenopathy Psych:  No delerium/psychosis/paranoia HENT: Normocephalic, Mucus membranes moist.  No thrush Neck: Supple, No tracheal deviation Chest: No chest wall pain w good excursion CV:  Pulses intact.  Regular rhythm MS: Normal AROM mjr joints.  No obvious deformity Abdomen: Soft.  Nondistended.  Mildly tender at incisions only.  No evidence of peritonitis.  No incarcerated hernias. Ext:  SCDs BLE.  No mjr edema.  No cyanosis Skin: No petechiae / purpura  Results:   Labs: Results for orders placed or performed during the hospital encounter of 01/24/16 (from the past 48 hour(s))  Basic metabolic panel     Status: Abnormal   Collection Time: 01/25/16  4:26 AM  Result Value Ref Range   Sodium 138 135 - 145 mmol/L   Potassium 4.0 3.5 - 5.1 mmol/L   Chloride 106 101 - 111 mmol/L  CO2 24 22 - 32 mmol/L   Glucose, Bld 133 (H) 65 - 99 mg/dL   BUN 27 (H) 6 - 20 mg/dL   Creatinine, Ser 1.23 (H) 0.44 - 1.00 mg/dL   Calcium 8.5 (L) 8.9 - 10.3 mg/dL   GFR calc non Af Amer 44 (L) >60 mL/min   GFR calc Af Amer 51 (L) >60 mL/min    Comment: (NOTE) The eGFR has been calculated using the CKD EPI equation. This calculation has not been validated in all clinical situations. eGFR's persistently <60 mL/min signify possible Chronic  Kidney Disease.    Anion gap 8 5 - 15  CBC     Status: Abnormal   Collection Time: 01/25/16  4:26 AM  Result Value Ref Range   WBC 9.0 4.0 - 10.5 K/uL   RBC 3.23 (L) 3.87 - 5.11 MIL/uL   Hemoglobin 9.5 (L) 12.0 - 15.0 g/dL   HCT 28.7 (L) 36.0 - 46.0 %   MCV 88.9 78.0 - 100.0 fL   MCH 29.4 26.0 - 34.0 pg   MCHC 33.1 30.0 - 36.0 g/dL   RDW 13.8 11.5 - 15.5 %   Platelets 211 150 - 400 K/uL  Magnesium     Status: None   Collection Time: 01/25/16  4:26 AM  Result Value Ref Range   Magnesium 1.7 1.7 - 2.4 mg/dL    Imaging / Studies: Dg C-arm 1-60 Min-no Report  01/24/2016  CLINICAL DATA: surgery C-ARM 1-60 MINUTES Fluoroscopy was utilized by the requesting physician.  No radiographic interpretation.    Medications / Allergies: per chart  Antibiotics: Anti-infectives    Start     Dose/Rate Route Frequency Ordered Stop   01/24/16 2200  cefoTEtan (CEFOTAN) 2 g in dextrose 5 % 50 mL IVPB     2 g 100 mL/hr over 30 Minutes Intravenous Every 12 hours 01/24/16 1650 01/24/16 2138   01/24/16 1230  clindamycin (CLEOCIN) 900 mg, gentamicin (GARAMYCIN) 240 mg in sodium chloride 0.9 % 1,000 mL for intraperitoneal lavage      Intraperitoneal To Surgery 01/24/16 1150 01/24/16 1415   01/24/16 0853  cefoTEtan (CEFOTAN) 2 g in dextrose 5 % 50 mL IVPB     2 g 100 mL/hr over 30 Minutes Intravenous On call to O.R. 01/24/16 0853 01/24/16 1055        Note: Portions of this report may have been transcribed using voice recognition software. Every effort was made to ensure accuracy; however, inadvertent computerized transcription errors may be present.   Any transcriptional errors that result from this process are unintentional.     Adin Hector, M.D., F.A.C.S. Gastrointestinal and Minimally Invasive Surgery [facility] [address], [place], [place] [address] [phone] Main / Paging   01/26/2016  CARE TEAM:  PCP: Annye Asa, MD  Outpatient  Care Team: Patient Care Team: Midge Minium, MD as PCP - General (Family Medicine) Mackie Pai, PA-C as Physician Assistant (Physician Assistant) Michael Boston, MD as Consulting Physician (General Surgery) Franchot Gallo, MD as Consulting Physician (Urology) Milus Banister, MD as Attending Physician (Gastroenterology) Nicholas Lose, MD as Consulting Physician (Hematology and Oncology)  Inpatient Treatment Team: Treatment Team: Attending Provider: Michael Boston, MD; Technician: Leda Quail, NT; Registered Nurse: Vicente Serene, RN; Registered Nurse: Bailey Mech, RN

## 2016-01-26 NOTE — Progress Notes (Signed)
2 Days Post-Op Subjective: Patient reports   Objective: Vital signs in last 24 hours: Temp:  [98.1 F (36.7 C)-98.6 F (37 C)] 98.3 F (36.8 C) (02/03 0507) Pulse Rate:  [66-79] 76 (02/03 0507) Resp:  [18-20] 20 (02/03 0507) BP: (122-149)/(66-77) 149/77 mmHg (02/03 0507) SpO2:  [98 %-100 %] 98 % (02/03 0507) Weight:  [95.981 kg (211 lb 9.6 oz)] 95.981 kg (211 lb 9.6 oz) (02/02 2146)  Intake/Output from previous day: 02/02 0701 - 02/03 0700 In: 2130 [P.O.:1080; I.V.:1050] Out: 4275 [Urine:4275] Intake/Output this shift: Total I/O In: 360 [P.O.:360] Out: 600 [Urine:600]  Physical Exam:  Constitutional: Vital signs reviewed. WD WN in NAD   Eyes: PERRL, No scleral icterus.   Pulmonary/Chest: Normal effort Abdominal: Soappropriately tender.   Extremities: No cyanosis or edema   Lab Results:  Recent Labs  01/25/16 0426  HGB 9.5*  HCT 28.7*   BMET  Recent Labs  01/25/16 0426  NA 138  K 4.0  CL 106  CO2 24  GLUCOSE 133*  BUN 27*  CREATININE 1.23*  CALCIUM 8.5*   No results for input(s): LABPT, INR in the last 72 hours. No results for input(s): LABURIN in the last 72 hours. Results for orders placed or performed in visit on 11/14/15  Urine culture     Status: None   Collection Time: 11/14/15 12:10 PM  Result Value Ref Range Status   Colony Count >=100,000 COLONIES/ML  Final   Organism ID, Bacteria Multiple bacterial morphotypes present, none  Final   Organism ID, Bacteria predominant. Suggest appropriate recollection if   Final   Organism ID, Bacteria clinically indicated.  Final    Studies/Results: No results found.  Assessment/Plan:   POD 2 colectomy, closure of colovesical fistula--doing very well. No catheter related issues, her urine is fairly clear.    She should go home with catheter-I will make sure that she has a bedside in a leg bag to go home with    We will call her to set up an appointment for cystogram as well as follow-up to remove  her catheter. That she feels well. No significant abdominal pain.   LOS: 2 days   Franchot Gallo M 01/26/2016, 12:23 PM

## 2016-01-27 NOTE — Discharge Summary (Signed)
Physician Discharge Summary  Patient ID: Cassandra Maldonado MRN: [id number] DOB/AGE: 1946/03/12 70 y.o.  Admit date: 01/24/2016 Discharge date: 01/27/2016  Admission Diagnoses: colovesical fistula  Discharge Diagnoses:  Principal Problem:   Colovesical fistula s/p robotic colectomy/bladder repair 01/24/2016 Active Problems:   HTN (hypertension)   Gout   Eczema   Anemia associated with chemotherapy   Discharged Condition: good  Hospital Course: patient admitted after surgery.  Her diet was advanced as tolerated.  She was discharged once tolerating a diet and pain was controlled with PO meds.  Patient will discharge with foley in place and return for cystogram later next week.    Consults: urology  Significant Diagnostic Studies: labs: cbc, chemistry  Treatments: IV hydration, analgesia: acetaminophen w/ codeine and surgery: robotic sigmoidectomy  Discharge Exam: Blood pressure 136/78, pulse 74, temperature 98.7 F (37.1 C), temperature source Oral, resp. rate 18, height 5\' 4"  (1.626 m), weight 95.1 kg (209 lb 10.5 oz), SpO2 98 %. General appearance: alert and cooperative GI: normal findings: soft, non-tender Incision/Wound: clean, draining some bloody discharge, no signs of infection  Disposition: 01-Home or Self Care  Discharge Instructions    Call MD for:  extreme fatigue    Complete by:  As directed      Call MD for:  hives    Complete by:  As directed      Call MD for:  persistant nausea and vomiting    Complete by:  As directed      Call MD for:  redness, tenderness, or signs of infection (pain, swelling, redness, odor or green/yellow discharge around incision site)    Complete by:  As directed      Call MD for:  severe uncontrolled pain    Complete by:  As directed      Call MD for:    Complete by:  As directed   Temperature > 101.42F     Diet - low sodium heart healthy    Complete by:  As directed      Discharge instructions    Complete by:  As directed   Please  see discharge instruction sheets.  Also refer to handout given an office.  Please call our office if you have any questions or concerns [phone]     Discharge wound care:    Complete by:  As directed   If you have closed incisions, shower and bathe over these incisions with soap and water every day.  Remove all surgical dressings on postoperative day #3.  You do not need to replace dressings over the closed incisions unless you feel more comfortable with a Band-Aid covering it.   If you have an open wound that requires packing, please see wound care instructions.  In general, remove all dressings, wash wound with soap and water and then replace with saline moistened gauze.  Do the dressing change at least every day.  Please call our office [phone] if you have further questions.     Driving Restrictions    Complete by:  As directed   No driving until off narcotics and can safely swerve away without pain during an emergency     Increase activity slowly    Complete by:  As directed   Walk an hour a day.  Use 20-30 minute walks.  When you can walk 30 minutes without difficulty, it is fine to restart low impact/moderate activities such as biking, jogging, swimming, sexual activity, etc.  Eventually you can increase to unrestricted activity when  not feeling pain.  If you feel pain: STOP!Marland Kitchen   Let pain protect you from overdoing it.  Use ice/heat & over-the-counter pain medications to help minimize soreness.  If that is not enough, then use your narcotic pain prescription as needed to remain active.  It is better to take extra pain medications and be more active than to stay bedridden to avoid all pain medications.     Lifting restrictions    Complete by:  As directed   Avoid heavy lifting initially.  Do not push through pain.  You have no specific weight limit - if it hurts to do, DON'T DO IT.   If you feel no pain, you are not injuring anything.  Pain will protect you from injury.  Coughing and  sneezing are far more stressful to your incision than any lifting.  Avoid resuming heavy lifting / intense activity until off all narcotic pain medications.  When ready to exercise more, give yourself 2 weeks to gradually get back to full intense exercise/activity.     May shower / Bathe    Complete by:  As directed      May walk up steps    Complete by:  As directed      Sexual Activity Restrictions    Complete by:  As directed   Sexual activity as tolerated.  Do not push through pain.  Pain will protect you from injury.     Walk with assistance    Complete by:  As directed   Walk over an hour a day.  May use a walker/cane/companion to help with balance and stamina.            Medication List    TAKE these medications        acetaminophen 500 MG tablet  Commonly known as:  TYLENOL  Take 500 mg by mouth every 6 (six) hours as needed (Pain).     anastrozole 1 MG tablet  Commonly known as:  ARIMIDEX  Take 1 tablet (1 mg total) by mouth daily.     oxyCODONE 5 MG immediate release tablet  Commonly known as:  Oxy IR/ROXICODONE  Take 1-2 tablets (5-10 mg total) by mouth every 4 (four) hours as needed for moderate pain, severe pain or breakthrough pain.     phenazopyridine 200 MG tablet  Commonly known as:  PYRIDIUM  Take 200 mg by mouth 3 (three) times daily as needed for pain.     vitamin B-12 100 MCG tablet  Commonly known as:  CYANOCOBALAMIN  Take 100 mcg by mouth daily.           Follow-up Information    Follow up with GROSS,STEVEN C., MD. Schedule an appointment as soon as possible for a visit in 2 weeks.   Specialty:  General Surgery   Why:  To follow up after your operation, To follow up after your hospital stay   Contact information:   [address] [place] [address] [phone]       Follow up with Jorja Loa, MD.   Specialty:  Urology   Why:  we will call you   Contact information:   [address] [place]  [address] [phone]       Follow up with GROSS,STEVEN C., MD. Schedule an appointment as soon as possible for a visit in 2 weeks.   Specialty:  General Surgery   Contact information:   [place]  [id number] [phone]       Signed: Rosario Adie [id number], [id number] AM

## 2016-01-27 NOTE — Discharge Instructions (Signed)
SURGERY: POST OP INSTRUCTIONS °(Surgery for small bowel obstruction, colon resection, etc)  ° ° °DIET °Follow a light diet the first few days at home.  Start with a bland diet such as soups, liquids, starchy foods, low fat foods, etc.  If you feel full, bloated, or constipated, stay on a ful liquid or pureed/blenderized diet for a few days until you feel better and no longer constipated. °Be sure to drink plenty of fluids every day to avoid getting dehydrated (feeling dizzy, not urinating, etc.). °Gradually add a fiber supplement to your diet over the next week.  Gradually get back to a regular solid diet.  Avoid fast food or heavy meals the first week as you are more likely to get nauseated. °It is expected for your digestive tract to need a few months to get back to normal.  It is common for your bowel movements and stools to be irregular.  You will have occasional bloating and cramping that should eventually fade away.  Until you are eating solid food normally, off all pain medications, and back to regular activities; your bowels will not be normal. °Focus on eating a low-fat, high fiber diet the rest of your life (See Getting to Good Bowel Health, below). ° °CARE of your INCISION or WOUND °It is good for closed incision and even open wounds to be washed every day.  Shower every day.  Short baths are fine.  Wash the incisions and wounds clean with soap & water.    °If you have a closed incision(s), wash the incision with soap & water every day.  You may leave closed incisions open to air if it is dry.   You may cover the incision with clean gauze & replace it after your daily shower for comfort. °If you have skin tapes (Steristrips) or skin glue (Dermabond) on your incision, leave them in place.  They will fall off on their own like a scab.  You may trim any edges that curl up with clean scissors.  If you have staples, set up an appointment for them to be removed in the office in 10 days after surgery.  °If you  have a drain, wash around the skin exit site with soap & water and place a new dressing of gauze or band aid around the skin every day.  Keep the drain site clean & dry.    °If you have an open wound with packing, see wound care instructions.  In general, it is encouraged that you remove your dressing and packing, shower with soap & water, and replace your dressing once a day.  Pack the wound with clean gauze moistened with normal (0.9%) saline to keep the wound moist & uninfected.  Pressure on the dressing for 30 minutes will stop most wound bleeding.  Eventually your body will heal & pull the open wound closed over the next few months.  °Raw open wounds will occasionally bleed or secrete yellow drainage until it heals closed.  Drain sites will drain a little until the drain is removed.  Even closed incisions can have mild bleeding or drainage the first few days until the skin edges scab over & seal.   °If you have an open wound with a wound vac, see wound vac care instructions. ° ° ° ° °ACTIVITIES as tolerated °Start light daily activities --- self-care, walking, climbing stairs-- beginning the day after surgery.  Gradually increase activities as tolerated.  Control your pain to be active.  Stop when you   are tired.  Ideally, walk several times a day, eventually an hour a day.   Most people are back to most day-to-day activities in a few weeks.  It takes 4-8 weeks to get back to unrestricted, intense activity. If you can walk 30 minutes without difficulty, it is safe to try more intense activity such as jogging, treadmill, bicycling, low-impact aerobics, swimming, etc. Save the most intensive and strenuous activity for last (Usually 4-8 weeks after surgery) such as sit-ups, heavy lifting, contact sports, etc.  Refrain from any intense heavy lifting or straining until you are off narcotics for pain control.  You will have off days, but things should improve week-by-week. DO NOT PUSH THROUGH PAIN.  Let pain be  your guide: If it hurts to do something, don't do it.  Pain is your body warning you to avoid that activity for another week until the pain goes down. You may drive when you are no longer taking narcotic prescription pain medication, you can comfortably wear a seatbelt, and you can safely make sudden turns/stops to protect yourself without hesitating due to pain. You may have sexual intercourse when it is comfortable. If it hurts to do something, stop.  MEDICATIONS Take your usually prescribed home medications unless otherwise directed.   Blood thinners:  Usually you can restart any strong blood thinners after the second postoperative day.  It is OK to take aspirin right away.     If you are on strong blood thinners (warfarin/Coumadin, Plavix, Xerelto, Eliquis, Pradaxa, etc), discuss with your surgeon, medicine PCP, and/or cardiologist for instructions on when to restart the blood thinner & if blood monitoring is needed (PT/INR blood check, etc).     PAIN CONTROL Pain after surgery or related to activity is often due to strain/injury to muscle, tendon, nerves and/or incisions.  This pain is usually short-term and will improve in a few months.  To help speed the process of healing and to get back to regular activity more quickly, DO THE FOLLOWING THINGS TOGETHER: 1. Increase activity gradually.  DO NOT PUSH THROUGH PAIN 2. Use Ice and/or Heat 3. Try Gentle Massage and/or Stretching 4. Take over the counter pain medication 5. Take Narcotic prescription pain medication for more severe pain  Good pain control = faster recovery.  It is better to take more medicine to be more active than to stay in bed all day to avoid medications. 1.  Increase activity gradually Avoid heavy lifting at first, then increase to lifting as tolerated over the next 6 weeks. Do not push through the pain.  Listen to your body and avoid positions and maneuvers than reproduce the pain.  Wait a few days before trying  something more intense Walking an hour a day is encouraged to help your body recover faster and more safely.  Start slowly and stop when getting sore.  If you can walk 30 minutes without stopping or pain, you can try more intense activity (running, jogging, aerobics, cycling, swimming, treadmill, sex, sports, weightlifting, etc.) Remember: If it hurts to do it, then dont do it! 2. Use Ice and/or Heat You will have swelling and bruising around the incisions.  This will take several weeks to resolve. Ice packs or heating pads (6-8 times a day, 30-60 minutes at a time) will help sooth soreness & bruising. Some people prefer to use ice alone, heat alone, or alternate between ice & heat.  Experiment and see what works best for you.  Consider trying ice for the first  few days to help decrease swelling and bruising; then, switch to heat to help relax sore spots and speed recovery. Shower every day.  Short baths are fine.  It feels good!  Keep the incisions and wounds clean with soap & water.   3. Try Gentle Massage and/or Stretching Massage at the area of pain many times a day Stop if you feel pain - do not overdo it 4. Take over the counter pain medication This helps the muscle and nerve tissues become less irritable and calm down faster Choose ONE of the following over-the-counter anti-inflammatory medications: Acetaminophen '500mg'$  tabs (Tylenol) 1-2 pills with every meal and just before bedtime (avoid if you have liver problems or if you have acetaminophen in you narcotic prescription) Naproxen '220mg'$  tabs (ex. Aleve, Naprosyn) 1-2 pills twice a day (avoid if you have kidney, stomach, IBD, or bleeding problems) Ibuprofen '200mg'$  tabs (ex. Advil, Motrin) 3-4 pills with every meal and just before bedtime (avoid if you have kidney, stomach, IBD, or bleeding problems) Take with food/snack several times a day as directed for at least 2 weeks to help keep pain / soreness down & more manageable. 5. Take Narcotic  prescription pain medication for more severe pain A prescription for strong pain control is often given to you upon discharge (for example: oxycodone/Percocet, hydrocodone/Norco/Vicodin, or tramadol/Ultram) Take your pain medication as prescribed. Be mindful that most narcotic prescriptions contain Tylenol (acetaminophen) as well - avoid taking too much Tylenol. If you are having problems/concerns with the prescription medicine (does not control pain, nausea, vomiting, rash, itching, etc.), please call us [phone] to see if we need to switch you to a different pain medicine that will work better for you and/or control your side effects better. If you need a refill on your pain medication, you must call the office before 4 pm and on weekdays only.  By federal law, prescriptions for narcotics cannot be called into a pharmacy.  They must be filled out on paper & picked up from our office by the patient or authorized caretaker.  Prescriptions cannot be filled after 4 pm nor on weekends.   WHEN TO CALL us [phone] Severe uncontrolled or worsening pain  Fever over 101 F (38.5 C) Concerns with the incision: Worsening pain, redness, rash/hives, swelling, bleeding, or drainage Reactions / problems with new medications (itching, rash, hives, nausea, etc.) Nausea and/or vomiting Difficulty urinating Difficulty breathing Worsening fatigue, dizziness, lightheadedness, blurred vision Other concerns If you are not getting better after two weeks or are noticing you are getting worse, contact our office [phone] for further advice.  We may need to adjust your medications, re-evaluate you in the office, send you to the emergency room, or see what other things we can do to help. The clinic staff is available to answer your questions during regular business hours (8:30am-5pm).  Please dont hesitate to call and ask to speak to one of our nurses for clinical concerns.    A surgeon from [facility] is always on call at the hospitals 24 hours/day If you have a medical emergency, go to the nearest emergency room or call 911. FOLLOW UP in our office One the day of your discharge from the hospital (or the next business weekday), please call [facility] to set up or confirm an appointment to see your surgeon in the office for a follow-up appointment.  Usually it is 2-3 weeks after your surgery.   If you have skin staples at  your incision(s), let the office know so we can set up a time in the office for the nurse to remove them (usually around 10 days after surgery). Make sure that you call for appointments the day of discharge (or the next business weekday) from the hospital to ensure a convenient appointment time. IF YOU HAVE DISABILITY OR FAMILY LEAVE FORMS, BRING THEM TO THE OFFICE FOR PROCESSING.  DO NOT GIVE THEM TO YOUR DOCTOR.  [facility] [address] [place], [place], [place]  [address] ? [phone] - Main [phone] - [place],  [phone] - Fax [email or web address]  GETTING TO GOOD BOWEL HEALTH. It is expected for your digestive tract to need a few months to get back to normal.  It is common for your bowel movements and stools to be irregular.  You will have occasional bloating and cramping that should eventually fade away.  Until you are eating solid food normally, off all pain medications, and back to regular activities; your bowels will not be normal.   Avoiding constipation The goal: ONE SOFT BOWEL MOVEMENT A DAY!    Drink plenty of fluids.  Choose water first. TAKE A FIBER SUPPLEMENT EVERY DAY THE REST OF YOUR LIFE During your first week back home, gradually add back a fiber supplement every day Experiment which form you can tolerate.   There are many forms such as powders, tablets, wafers, gummies, etc Psyllium bran (Metamucil), methylcellulose (Citrucel), Miralax or Glycolax, Benefiber, Flax Seed.    Adjust the dose week-by-week (1/2 dose/day to 6 doses a day) until you are moving your bowels 1-2 times a day.  Cut back the dose or try a different fiber product if it is giving you problems such as diarrhea or bloating. Sometimes a laxative is needed to help jump-start bowels if constipated until the fiber supplement can help regulate your bowels.  If you are tolerating eating & you are farting, it is okay to try a gentle laxative such as double dose MiraLax, prune juice, or Milk of Magnesia.  Avoid using laxatives too often. Stool softeners can sometimes help counteract the constipating effects of narcotic pain medicines.  It can also cause diarrhea, so avoid using for too long. If you are still constipated despite taking fiber daily, eating solids, and a few doses of laxatives, call our office. Controlling diarrhea Try drinking liquids and eating bland foods for a few days to avoid stressing your intestines further. Avoid dairy products (especially milk & ice cream) for a short time.  The intestines often can lose the ability to digest lactose when stressed. Avoid foods that cause gassiness or bloating.  Typical foods include beans and other legumes, cabbage, broccoli, and dairy foods.  Avoid greasy, spicy, fast foods.  Every person has some sensitivity to other foods, so listen to your body and avoid those foods that trigger problems for you. Probiotics (such as active yogurt, Align, etc) may help repopulate the intestines and colon with normal bacteria and calm down a sensitive digestive tract Adding a fiber supplement gradually can help thicken stools by absorbing excess fluid and retrain the intestines to act more normally.  Slowly increase the dose over a few weeks.  Too much fiber too soon can backfire and cause cramping & bloating. It is okay to try and slow down diarrhea with a few doses of antidiarrheal medicines.   Bismuth subsalicylate (ex. Kayopectate, Pepto Bismol) for a few doses can  help control diarrhea.  Avoid if pregnant.  Loperamide (Imodium) can slow down diarrhea.  Start with one tablet (2mg ) first.  Avoid if you are having fevers or severe pain.  ILEOSTOMY PATIENTS WILL HAVE CHRONIC DIARRHEA since their colon is not in use.    Drink plenty of liquids.  You will need to drink even more glasses of water/liquid a day to avoid getting dehydrated. Record output from your ileostomy.  Expect to empty the bag every 3-4 hours at first.  Most people with a permanent ileostomy empty their bag 4-6 times at the least.   Use antidiarrheal medicine (especially Imodium) several times a day to avoid getting dehydrated.  Start with a dose at bedtime & breakfast.  Adjust up or down as needed.  Increase antidiarrheal medications as directed to avoid emptying the bag more than 8 times a day (every 3 hours). Work with your wound ostomy nurse to learn care for your ostomy.  See ostomy care instructions. TROUBLESHOOTING IRREGULAR BOWELS 1) Start with a soft & bland diet. No spicy, greasy, or fried foods.  2) Avoid gluten/wheat or dairy products from diet to see if symptoms improve. 3) Miralax 17gm or flax seed mixed in [place]. water or juice-daily. May use 2-4 times a day as needed. 4) Gas-X, Phazyme, etc. as needed for gas & bloating.  5) Prilosec (omeprazole) over-the-counter as needed 6)  Consider probiotics (Align, Activa, etc) to help calm the bowels down  Call your doctor if you are getting worse or not getting better.  Sometimes further testing (cultures, endoscopy, X-ray studies, CT scans, bloodwork, etc.) may be needed to help diagnose and treat the cause of the diarrhea. [facility], [place], [place], [place], [place]  [address] [phone] - Main.    [phone]  - Toll Free.   [phone] - Fax [email or web address]  Foley Catheter Care, Adult A Foley catheter is a soft, flexible tube that is placed into the bladder to drain urine.  A Foley catheter may be inserted if:  You leak urine or are not able to control when you urinate (urinary incontinence).  You are not able to urinate when you need to (urinary retention).  You had prostate surgery or surgery on the genitals.  You have certain medical conditions, such as multiple sclerosis, dementia, or a spinal cord injury. If you are going home with a Foley catheter in place, follow the instructions below. TAKING CARE OF THE CATHETER  Wash your hands with soap and water.  Using mild soap and warm water on a clean washcloth:  Clean the area on your body closest to the catheter insertion site using a circular motion, moving away from the catheter. Never wipe toward the catheter because this could sweep bacteria up into the urethra and cause infection.  Remove all traces of soap. Pat the area dry with a clean towel. For males, reposition the foreskin.  Attach the catheter to your leg so there is no tension on the catheter. Use adhesive tape or a leg strap. If you are using adhesive tape, remove any sticky residue left behind by the previous tape you used.  Keep the drainage bag below the level of the bladder, but keep it off the floor.  Check throughout the day to be sure the catheter is working and urine is draining freely. Make sure the tubing does not become kinked.  Do not pull on the catheter or try to remove it. Pulling could damage internal tissues. TAKING CARE OF THE DRAINAGE BAGS You will  be given two drainage bags to take home. One is a large overnight drainage bag, and the other is a smaller leg bag that fits underneath clothing. You may wear the overnight bag at any time, but you should never wear the smaller leg bag at night. Follow the instructions below for how to empty, change, and clean your drainage bags. Emptying the Drainage Bag You must empty your drainage bag when it is  - full or at least 2-3 times a day.  Wash your hands with soap and  water.  Keep the drainage bag below your hips, below the level of your bladder. This stops urine from going back into the tubing and into your bladder.  Hold the dirty bag over the toilet or a clean container.  Open the pour spout at the bottom of the bag and empty the urine into the toilet or container. Do not let the pour spout touch the toilet, container, or any other surface. Doing so can place bacteria on the bag, which can cause an infection.  Clean the pour spout with a gauze pad or cotton ball that has rubbing alcohol on it.  Close the pour spout.  Attach the bag to your leg with adhesive tape or a leg strap.  Wash your hands well. Changing the Drainage Bag Change your drainage bag once a month or sooner if it starts to smell bad or look dirty. Below are steps to follow when changing the drainage bag.  Wash your hands with soap and water.  Pinch off the rubber catheter so that urine does not spill out.  Disconnect the catheter tube from the drainage tube at the connection valve. Do not let the tubes touch any surface.  Clean the end of the catheter tube with an alcohol wipe. Use a different alcohol wipe to clean the end of the drainage tube.  Connect the catheter tube to the drainage tube of the clean drainage bag.  Attach the new bag to the leg with adhesive tape or a leg strap. Avoid attaching the new bag too tightly.  Wash your hands well. Cleaning the Drainage Bag  Wash your hands with soap and water.  Wash the bag in warm, soapy water.  Rinse the bag thoroughly with warm water.  Fill the bag with a solution of white vinegar and water (1 cup vinegar to 1 qt warm water [.2 L vinegar to 1 L warm water]). Close the bag and soak it for 30 minutes in the solution.  Rinse the bag with warm water.  Hang the bag to dry with the pour spout open and hanging downward.  Store the clean bag (once it is dry) in a clean plastic bag.  Wash your hands well. PREVENTING  INFECTION  Wash your hands before and after handling your catheter.  Take showers daily and wash the area where the catheter enters your body. Do not take baths. Replace wet leg straps with dry ones, if this applies.  Do not use powders, sprays, or lotions on the genital area. Only use creams, lotions, or ointments as directed by your caregiver.  For females, wipe from front to back after each bowel movement.  Drink enough fluids to keep your urine clear or pale yellow unless you have a fluid restriction.  Do not let the drainage bag or tubing touch or lie on the floor.  Wear cotton underwear to absorb moisture and to keep your skin drier. SEEK MEDICAL CARE IF:   Your urine is  cloudy or smells unusually bad.  Your catheter becomes clogged.  You are not draining urine into the bag or your bladder feels full.  Your catheter starts to leak. SEEK IMMEDIATE MEDICAL CARE IF:   You have pain, swelling, redness, or pus where the catheter enters the body.  You have pain in the abdomen, legs, lower back, or bladder.  You have a fever.  You see blood fill the catheter, or your urine is pink or red.  You have nausea, vomiting, or chills.  Your catheter gets pulled out. MAKE SURE YOU:   Understand these instructions.  Will watch your condition.  Will get help right away if you are not doing well or get worse.   This information is not intended to replace advice given to you by your health care provider. Make sure you discuss any questions you have with your health care provider.   Document Released: 12/09/2005 Document Revised: 04/25/2014 Document Reviewed: 11/30/2012 Elsevier Interactive Patient Education 2016 [place]. Irregular bowel habits such as constipation and diarrhea can lead to many problems over time.  Having one soft bowel movement a day is the most important way to prevent further problems.  The anorectal canal is designed to  handle stretching and feces to safely manage our ability to get rid of solid waste (feces, poop, stool) out of our body.  BUT, hard constipated stools can act like ripping concrete bricks and diarrhea can be a burning fire to this very sensitive area of our body, causing inflamed hemorrhoids, anal fissures, increasing risk is perirectal abscesses, abdominal pain/bloating, an making irritable bowel worse.      The goal: ONE SOFT BOWEL MOVEMENT A DAY!  To have soft, regular bowel movements:   Drink plenty of fluids, consider 4-6 tall glasses of water a day.    Take plenty of fiber.  Fiber is the undigested part of plant food that passes into the colon, acting s natures broom to encourage bowel motility and movement.  Fiber can absorb and hold large amounts of water. This results in a larger, bulkier stool, which is soft and easier to pass. Work gradually over several weeks up to 6 servings a day of fiber (25g a day even more if needed) in the form of: o Vegetables -- Root (potatoes, carrots, turnips), leafy green (lettuce, salad greens, celery, spinach), or cooked high residue (cabbage, broccoli, etc) o Fruit -- Fresh (unpeeled skin & pulp), Dried (prunes, apricots, cherries, etc ),  or stewed ( applesauce)  o Whole grain breads, pasta, etc (whole wheat)  o Bran cereals   Bulking Agents -- This type of water-retaining fiber generally is easily obtained each day by one of the following:  o Psyllium bran -- The psyllium plant is remarkable because its ground seeds can retain so much water. This product is available as Metamucil, Konsyl, Effersyllium, Per Diem Fiber, or the less expensive generic preparation in drug and health food stores. Although labeled a laxative, it really is not a laxative.  o Methylcellulose -- This is another fiber derived from wood which also retains water. It is available as Citrucel. o Polyethylene Glycol - and artificial fiber commonly called Miralax or Glycolax.  It is  helpful for people with gassy or bloated feelings with regular fiber o Flax Seed - a less gassy fiber than psyllium  No reading or other relaxing activity while on the toilet. If bowel movements take longer than 5 minutes, you are too  constipated  AVOID CONSTIPATION.  High fiber and water intake usually takes care of this.  Sometimes a laxative is needed to stimulate more frequent bowel movements, but   Laxatives are not a good long-term solution as it can wear the colon out.  They can help jump-start bowels if constipated, but should be relied on constantly without discussing with your doctor o Osmotics (Milk of Magnesia, Fleets phosphosoda, Magnesium citrate, MiraLax, GoLytely) are safer than  o Stimulants (Senokot, Castor Oil, Dulcolax, Ex Lax)    o Avoid taking laxatives for more than 7 days in a row.   IF SEVERELY CONSTIPATED, try a Bowel Retraining Program: o Do not use laxatives.  o Eat a diet high in roughage, such as bran cereals and leafy vegetables.  o Drink six (6) ounces of prune or apricot juice each morning.  o Eat two (2) large servings of stewed fruit each day.  o Take one (1) heaping tablespoon of a psyllium-based bulking agent twice a day. Use sugar-free sweetener when possible to avoid excessive calories.  o Eat a normal breakfast.  o Set aside 15 minutes after breakfast to sit on the toilet, but do not strain to have a bowel movement.  o If you do not have a bowel movement by the third day, use an enema and repeat the above steps.   Controlling diarrhea o Switch to liquids and simpler foods for a few days to avoid stressing your intestines further. o Avoid dairy products (especially milk & ice cream) for a short time.  The intestines often can lose the ability to digest lactose when stressed. o Avoid foods that cause gassiness or bloating.  Typical foods include beans and other legumes, cabbage, broccoli, and dairy foods.  Every person has some sensitivity to other  foods, so listen to our body and avoid those foods that trigger problems for you. o Adding fiber (Citrucel, Metamucil, psyllium, Miralax) gradually can help thicken stools by absorbing excess fluid and retrain the intestines to act more normally.  Slowly increase the dose over a few weeks.  Too much fiber too soon can backfire and cause cramping & bloating. o Probiotics (such as active yogurt, Align, etc) may help repopulate the intestines and colon with normal bacteria and calm down a sensitive digestive tract.  Most studies show it to be of mild help, though, and such products can be costly. o Medicines: - Bismuth subsalicylate (ex. Kayopectate, Pepto Bismol) every 30 minutes for up to 6 doses can help control diarrhea.  Avoid if pregnant. - Loperamide (Immodium) can slow down diarrhea.  Start with two tablets (4mg  total) first and then try one tablet every 6 hours.  Avoid if you are having fevers or severe pain.  If you are not better or start feeling worse, stop all medicines and call your doctor for advice o Call your doctor if you are getting worse or not better.  Sometimes further testing (cultures, endoscopy, X-ray studies, bloodwork, etc) may be needed to help diagnose and treat the cause of the diarrhea.  TROUBLESHOOTING IRREGULAR BOWELS 1) Avoid extremes of bowel movements (no bad constipation/diarrhea) 2) Miralax 17gm mixed in 8oz. water or juice-daily. May use BID as needed.  3) Gas-x,Phazyme, etc. as needed for gas & bloating.  4) Soft,bland diet. No spicy,greasy,fried foods.  5) Prilosec over-the-counter as needed  6) May hold gluten/wheat products from diet to see if symptoms improve.  7)  May try probiotics (Align, Activa, etc) to help calm the bowels down  7) If symptoms become worse call back immediately.  Managing Pain  Pain after surgery or related to activity is often due to strain/injury to muscle, tendon, nerves and/or incisions.  This pain is usually short-term and will  improve in a few months.   Many people find it helpful to do the following things TOGETHER to help speed the process of healing and to get back to regular activity more quickly:  1. Avoid heavy physical activity at first a. No lifting greater than 20 pounds at first, then increase to lifting as tolerated over the next few weeks b. Do not push through the pain.  Listen to your body and avoid positions and maneuvers than reproduce the pain.  Wait a few days before trying something more intense c. Walking is okay as tolerated, but go slowly and stop when getting sore.  If you can walk 30 minutes without stopping or pain, you can try more intense activity (running, jogging, aerobics, cycling, swimming, treadmill, sex, sports, weightlifting, etc ) d. Remember: If it hurts to do it, then dont do it!  2. Take Anti-inflammatory medication a. Choose ONE of the following over-the-counter medications: i.            Acetaminophen 500mg  tabs (Tylenol) 1-2 pills with every meal and just before bedtime (avoid if you have liver problems) ii.            Naproxen 220mg  tabs (ex. Aleve) 1-2 pills twice a day (avoid if you have kidney, stomach, IBD, or bleeding problems) iii. Ibuprofen 200mg  tabs (ex. Advil, Motrin) 3-4 pills with every meal and just before bedtime (avoid if you have kidney, stomach, IBD, or bleeding problems) b. Take with food/snack around the clock for 1-2 weeks i. This helps the muscle and nerve tissues become less irritable and calm down faster  3. Use a Heating pad or Ice/Cold Pack a. 4-6 times a day b. May use warm bath/hottub  or showers  4. Try Gentle Massage and/or Stretching  a. at the area of pain many times a day b. stop if you feel pain - do not overdo it  Try these steps together to help you body heal faster and avoid making things get worse.  Doing just one of these things may not be enough.    If you are not getting better after two weeks or are noticing you are getting  worse, contact our office for further advice; we may need to re-evaluate you & see what other things we can do to help.  Diverticulosis Diverticulosis is the condition that develops when small pouches (diverticula) form in the wall of your colon. Your colon, or large intestine, is where water is absorbed and stool is formed. The pouches form when the inside layer of your colon pushes through weak spots in the outer layers of your colon. CAUSES  No one knows exactly what causes diverticulosis. RISK FACTORS  Being older than 47. Your risk for this condition increases with age. Diverticulosis is rare in people younger than 40 years. By age 72, almost everyone has it.  Eating a low-fiber diet.  Being frequently constipated.  Being overweight.  Not getting enough exercise.  Smoking.  Taking over-the-counter pain medicines, like aspirin and ibuprofen. SYMPTOMS  Most people with diverticulosis do not have symptoms. DIAGNOSIS  Because diverticulosis often has no symptoms, health care providers often discover the condition during an exam for other colon problems. In many cases, a health care provider will diagnose diverticulosis while using  a flexible scope to examine the colon (colonoscopy). TREATMENT  If you have never developed an infection related to diverticulosis, you may not need treatment. If you have had an infection before, treatment may include:  Eating more fruits, vegetables, and grains.  Taking a fiber supplement.  Taking a live bacteria supplement (probiotic).  Taking medicine to relax your colon. HOME CARE INSTRUCTIONS   Drink at least 6-8 glasses of water each day to prevent constipation.  Try not to strain when you have a bowel movement.  Keep all follow-up appointments. If you have had an infection before:  Increase the fiber in your diet as directed by your health care provider or dietitian.  Take a dietary fiber supplement if your health care provider  approves.  Only take medicines as directed by your health care provider. SEEK MEDICAL CARE IF:   You have abdominal pain.  You have bloating.  You have cramps.  You have not gone to the bathroom in 3 days. SEEK IMMEDIATE MEDICAL CARE IF:   Your pain gets worse.  Yourbloating becomes very bad.  You have a fever or chills, and your symptoms suddenly get worse.  You begin vomiting.  You have bowel movements that are bloody or black. MAKE SURE YOU:  Understand these instructions.  Will watch your condition.  Will get help right away if you are not doing well or get worse.   This information is not intended to replace advice given to you by your health care provider. Make sure you discuss any questions you have with your health care provider.   Document Released: 09/05/2004 Document Revised: 12/14/2013 Document Reviewed: 11/03/2013 Elsevier Interactive Patient Education [employer].

## 2016-01-27 NOTE — Progress Notes (Signed)
3 Days Post-Op robotic sigmoidectomy and colovesical fistula repair Subjective: Doing well, tolerating a diet.  Pain controlled.  Mild nausea this am.    Objective: Vital signs in last 24 hours: Temp:  [98 F (36.7 C)-98.7 F (37.1 C)] 98.7 F (37.1 C) (02/04 0524) Pulse Rate:  [74-76] 74 (02/04 0524) Resp:  [18] 18 (02/04 0524) BP: (136-167)/(68-83) 136/78 mmHg (02/04 0840) SpO2:  [97 %-100 %] 98 % (02/04 0524) Weight:  [95.1 kg (209 lb 10.5 oz)] 95.1 kg (209 lb 10.5 oz) (02/04 0231)   Intake/Output from previous day: 02/03 0701 - 02/04 0700 In: 1920 [P.O.:1920] Out: 3650 [Urine:3650] Intake/Output this shift:     General appearance: alert and cooperative GI: normal findings: soft, non-tender  Incision: no significant drainage, no significant erythema  Lab Results:   Recent Labs  01/25/16 0426  WBC 9.0  HGB 9.5*  HCT 28.7*  PLT 211   BMET  Recent Labs  01/25/16 0426  NA 138  K 4.0  CL 106  CO2 24  GLUCOSE 133*  BUN 27*  CREATININE 1.23*  CALCIUM 8.5*   PT/INR No results for input(s): LABPROT, INR in the last 72 hours. ABG No results for input(s): PHART, HCO3 in the last 72 hours.  Invalid input(s): PCO2, PO2  MEDS, Scheduled . acetaminophen  1,000 mg Oral TID  . alvimopan  12 mg Oral BID  . anastrozole  1 mg Oral Daily  . enoxaparin (LOVENOX) injection  40 mg Subcutaneous Q24H  . feeding supplement (ENSURE ENLIVE)  237 mL Oral BID BM  . lip balm  1 application Topical BID  . saccharomyces boulardii  250 mg Oral BID  . sodium chloride flush  3 mL Intravenous Q12H  . vitamin B-12  100 mcg Oral Daily    Studies/Results: No results found.  Assessment: s/p Procedure(s): XI ROBOTIC ASSISTED sigmoid colectomy REPAIR OF COLOVESICAL FISTULA. PRIMARY REPAIR OF VENTRAL WALL HERNIA CYSTO WITH BLADDER BIOPSY, PLACEMENT OF BILATERAL URETERAL CATHETERS Patient Active Problem List   Diagnosis Date Noted  . Colovesical fistula s/p robotic  colectomy/bladder repair 01/24/2016 01/24/2016  . Peripheral neuropathy due to chemotherapy ([place]) 09/19/2015  . Mucositis 06/08/2015  . Leg edema 06/08/2015  . Anemia associated with chemotherapy 06/08/2015  . Breast cancer of upper-outer quadrant of left female breast ([place]) 03/13/2015  . Left shoulder pain 10/21/2014  . Trigger finger of left hand 02/23/2014  . Hip pain, bilateral 03/29/2013  . Dysphagia, unspecified(787.20) 03/29/2013  . HTN (hypertension) 07/29/2012  . PND (post-nasal drip) 07/29/2012  . Gout 07/29/2012  . 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 Eczema 07/29/2012    Expected post op course  Plan: Discharge home with foley catheter  Pt to f/u with Dr Dahlstedt's office next week for cystogram   LOS: 3 days     .Cassandra Maldonado, [facility], [place]   01/27/2016 9:44 AM

## 2016-01-27 NOTE — Care Management Important Message (Signed)
Important Message  Patient Details  Name: Cassandra Maldonado MRN: [id number] Date of Birth: 10/08/1946   Medicare Important Message Given:  Yes    Apolonio Schneiders, RN 01/27/2016, 12:14 PM

## 2016-01-29 ENCOUNTER — Encounter (HOSPITAL_COMMUNITY): Payer: Self-pay | Admitting: Emergency Medicine

## 2016-01-29 ENCOUNTER — Emergency Department (HOSPITAL_COMMUNITY)
Admission: EM | Admit: 2016-01-29 | Discharge: 2016-01-29 | Disposition: A | Discharge status: Home / Self Care | Payer: Medicare Other | Attending: Emergency Medicine | Admitting: Emergency Medicine

## 2016-01-29 DIAGNOSIS — Z79899 Other long term (current) drug therapy: Secondary | ICD-10-CM | POA: Insufficient documentation

## 2016-01-29 DIAGNOSIS — H6980 Other specified disorders of Eustachian tube, unspecified ear: Secondary | ICD-10-CM | POA: Insufficient documentation

## 2016-01-29 DIAGNOSIS — Z8619 Personal history of other infectious and parasitic diseases: Secondary | ICD-10-CM | POA: Diagnosis not present

## 2016-01-29 DIAGNOSIS — Z8709 Personal history of other diseases of the respiratory system: Secondary | ICD-10-CM | POA: Insufficient documentation

## 2016-01-29 DIAGNOSIS — E7800 Pure hypercholesterolemia, unspecified: Secondary | ICD-10-CM | POA: Insufficient documentation

## 2016-01-29 DIAGNOSIS — Z973 Presence of spectacles and contact lenses: Secondary | ICD-10-CM | POA: Insufficient documentation

## 2016-01-29 DIAGNOSIS — M6248 Contracture of muscle, other site: Secondary | ICD-10-CM | POA: Insufficient documentation

## 2016-01-29 DIAGNOSIS — Q248 Other specified congenital malformations of heart: Secondary | ICD-10-CM | POA: Diagnosis not present

## 2016-01-29 DIAGNOSIS — R339 Retention of urine, unspecified: Secondary | ICD-10-CM | POA: Diagnosis present

## 2016-01-29 DIAGNOSIS — Z853 Personal history of malignant neoplasm of breast: Secondary | ICD-10-CM | POA: Insufficient documentation

## 2016-01-29 DIAGNOSIS — E669 Obesity, unspecified: Secondary | ICD-10-CM | POA: Diagnosis not present

## 2016-01-29 LAB — URINALYSIS, ROUTINE W REFLEX MICROSCOPIC
BILIRUBIN URINE: NEGATIVE
Glucose, UA: NEGATIVE mg/dL
Ketones, ur: NEGATIVE mg/dL
Nitrite: NEGATIVE
PH: 6 (ref 5.0–8.0)
Protein, ur: NEGATIVE mg/dL
SPECIFIC GRAVITY, URINE: 1.012 (ref 1.005–1.030)

## 2016-01-29 LAB — URINE MICROSCOPIC-ADD ON: Squamous Epithelial / LPF: NONE SEEN

## 2016-01-29 NOTE — ED Provider Notes (Signed)
CSN: [id number]     Arrival date & time 01/29/16  1959 History   First MD Initiated Contact with Patient 01/29/16 2204     Chief Complaint  Patient presents with  . Urinary Retention     (Consider location/radiation/quality/duration/timing/severity/associated sxs/prior Treatment) HPI Comments: Patient presents with lower abdominal pain and distention. She has a history for recent colovesicular fistula. She had surgery on February 1 which includes a colostomy as well as repair of her bladder wall. She has a Foley catheter in place. She states about 5:00 this afternoon she noted that it wasn't draining. She hasn't noted any drainage since that time. She's had increasing pressure to her lower abdomen. There is no nausea or vomiting. No fevers. She's noted some little blood clots here and there but nothing significant.   Past Medical History  Diagnosis Date  . Chest pressure   . Palpitations   . Obesity   . Chicken pox   . Breast cancer, left breast ([place])   . Family history of ovarian cancer   . Full dentures   . Wears glasses   . 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 Hypercholesterolemia     Patient denies  . Trapezius muscle spasm 10/21/2014  . Eustachian tube dysfunction 10/21/2014  . Acute bronchitis 03/22/2015  . Chest pain 10/04/2014   Past Surgical History  Procedure Laterality Date  . Retinal detachment surgery  2006    rt  . Vaginal hysterectomy    . Cesarean section  1980  . Hemorroidectomy    . Portacath placement N/A 03/16/2015    Procedure: INSERTION PORT-A-CATH;  Surgeon: Rolm Bookbinder, MD;  Location: [place] [place];  Service: General;  Laterality: N/A;  . Radioactive seed guided mastectomy with axillary sentinel lymph node biopsy Left 09/12/2015    Procedure: LEFT BREAST RADIOACTIVE SEED LOCALIZED PARTILA MASTECTOMY WITH SENTINEL LYMPH NODE MAPPING AND PORT REMOVAL;  Surgeon: Erroll Luna, MD;  Location: [place];   Service: General;  Laterality: Left;  . Port-a-cath removal Right 09/12/2015    Procedure: REMOVAL PORT-A-CATH;  Surgeon: Erroll Luna, MD;  Location: [place];  Service: General;  Laterality: Right;  . Xi robotic assisted lower anterior resection N/A 01/24/2016    Procedure: XI ROBOTIC ASSISTED sigmoid colectomy REPAIR OF COLOVESICAL FISTULA. PRIMARY REPAIR OF VENTRAL WALL HERNIA;  Surgeon: Michael Boston, MD;  Location: [facility];  Service: General;  Laterality: N/A;  . Cystoscopy with stent placement Bilateral 01/24/2016    Procedure: CYSTO WITH BLADDER BIOPSY, PLACEMENT OF BILATERAL URETERAL CATHETERS;  Surgeon: Franchot Gallo, MD;  Location: [facility];  Service: Urology;  Laterality: Bilateral;   Family History  Problem Relation Age of Onset  . Diabetes Brother   . Arthritis Maternal Grandmother   . Lymphoma Mother     dx in her late 18s  . Liver cancer Father 36  . Ovarian cancer Sister 47    primary peritoneal cancer  . Colon cancer Neg Hx    Social History  Substance Use Topics  . Smoking status: Never Smoker   . Smokeless tobacco: Never Used  . Alcohol Use: No   OB History    No data available     Review of Systems  Constitutional: Negative for fever, chills, diaphoresis and fatigue.  HENT: Negative for congestion, rhinorrhea and sneezing.   Eyes: Negative.   Respiratory: Negative for cough, chest tightness and shortness of breath.   Cardiovascular: Negative for chest pain and leg  swelling.  Gastrointestinal: Positive for abdominal pain. Negative for nausea, vomiting, diarrhea and blood in stool.  Genitourinary: Positive for dysuria. Negative for frequency, hematuria, flank pain and difficulty urinating.  Musculoskeletal: Negative for back pain and arthralgias.  Skin: Negative for rash.  Neurological: Negative for dizziness, speech difficulty, weakness, numbness and headaches.      Allergies  Review of patient's allergies indicates no known  allergies.  Home Medications   Prior to Admission medications   Medication Sig Start Date End Date Taking? Authorizing Provider  acetaminophen (TYLENOL) 500 MG tablet Take 500-1,000 mg by mouth every 6 (six) hours as needed for moderate pain (Pain).    Yes Historical Provider, MD  anastrozole (ARIMIDEX) 1 MG tablet Take 1 tablet (1 mg total) by mouth daily. 11/13/15  Yes Nicholas Lose, MD  oxyCODONE (OXY IR/ROXICODONE) 5 MG immediate release tablet Take 1-2 tablets (5-10 mg total) by mouth every 4 (four) hours as needed for moderate pain, severe pain or breakthrough pain. 01/25/16  Yes Michael Boston, MD  phenazopyridine (PYRIDIUM) 200 MG tablet Take 200 mg by mouth 3 (three) times daily as needed for pain.   Yes Historical Provider, MD  vitamin B-12 (CYANOCOBALAMIN) 100 MCG tablet Take 100 mcg by mouth daily.   Yes Historical Provider, MD  ciprofloxacin (CIPRO) 500 MG tablet Take 500 mg by mouth 2 (two) times daily. Reported on 01/29/2016 12/26/15   Historical Provider, MD   BP 153/77 mmHg  Pulse 82  Temp(Src) 97.9 F (36.6 C) (Oral)  Resp 18  SpO2 100% Physical Exam  Constitutional: She is oriented to person, place, and time. She appears well-developed and well-nourished. She appears distressed.  Patient appears uncomfortable  HENT:  Head: Normocephalic and atraumatic.  Eyes: Pupils are equal, round, and reactive to light.  Neck: Normal range of motion. Neck supple.  Cardiovascular: Normal rate, regular rhythm and normal heart sounds.   Pulmonary/Chest: Effort normal and breath sounds normal. No respiratory distress. She has no wheezes. She has no rales. She exhibits no tenderness.  Abdominal: Soft. Bowel sounds are normal. She exhibits distension. There is tenderness. There is no rebound and no guarding.  Musculoskeletal: Normal range of motion. She exhibits no edema.  Lymphadenopathy:    She has no cervical adenopathy.  Neurological: She is alert and oriented to person, place, and time.   Skin: Skin is warm and dry. No rash noted.  Psychiatric: She has a normal mood and affect.    ED Course  Procedures (including critical care time) Labs Review Labs Reviewed  URINALYSIS, ROUTINE W REFLEX MICROSCOPIC (NOT AT [facility]) - Abnormal; Notable for the following:    Hgb urine dipstick MODERATE (*)    Leukocytes, UA MODERATE (*)    All other components within normal limits  URINE MICROSCOPIC-ADD ON - Abnormal; Notable for the following:    Bacteria, UA FEW (*)    All other components within normal limits  URINE CULTURE    Imaging Review No results found. I have personally reviewed and evaluated these images and lab results as part of my medical decision-making.   EKG Interpretation None      MDM   Final diagnoses:  Urinary retention   Patient had called her urologist office and was told that since she had recent surgery, a urologist would need to change the catheter out. I contacted Dr.Wrenn he states that it is fine for [place] to change the catheter in the ED. The catheter was removed and a Foley was placed. She  had clear urine returned. She's had over 400 cc of output. She states she's feeling much better. She denies any abdominal pain. Her urine had few bacteria and leukocytes.  Urine was sent for culture but she was not started on Anna biotics at this point. She was advised to follow-up with [facility].    Malvin Johns, MD 01/29/16 2330

## 2016-01-29 NOTE — Discharge Instructions (Signed)
Acute Urinary Retention, Female °Acute urinary retention is the temporary inability to urinate. This is an uncommon problem in women. It can be caused by: °· Infection. °· A side effect of a medicine. °· A problem in a nearby organ that presses or squeezes on the bladder or the urethra (the tube that drains the bladder). °· Psychological problems. °·  Surgery on your bladder, urethra, or pelvic organs that causes obstruction to the outflow of urine from your bladder. °HOME CARE INSTRUCTIONS  °If you are sent home with a Foley catheter and a drainage system, you will need to discuss the best course of action with your health care provider. While the catheter is in, maintain a good intake of fluids. Keep the drainage bag emptied and lower than your catheter. This is so that contaminated urine will not flow back into your bladder, which could lead to a urinary tract infection. °There are two main types of drainage bags. One is a large bag that usually is used at night. It has a good capacity that will allow you to sleep through the night without having to empty it. The second type is called a leg bag. It has a smaller capacity so it needs to be emptied more frequently. However, the main advantage is that it can be attached by a leg strap and goes underneath your clothing, allowing you the freedom to move about or leave your home. °Only take over-the-counter or prescription medicines for pain, discomfort, or fever as directed by your health care provider.  °SEEK MEDICAL CARE IF: °· You develop a low-grade fever. °· You experience spasms or leakage of urine with the spasms. °SEEK IMMEDIATE MEDICAL CARE IF:  °· You develop chills or fever. °· Your catheter stops draining urine. °· Your catheter falls out. °· You start to develop increased bleeding that does not respond to rest and increased fluid intake. °MAKE SURE YOU: °· Understand these instructions. °· Will watch your condition. °· Will get help right away if you are  not doing well or get worse. °  °This information is not intended to replace advice given to you by your health care provider. Make sure you discuss any questions you have with your health care provider. °  °Document Released: 12/08/2006 Document Revised: 04/25/2015 Document Reviewed: 05/20/2013 °Elsevier Interactive Patient Education ©2016 [employer] ° °

## 2016-01-29 NOTE — ED Notes (Signed)
Pt states that she had surgery 5 days ago on her bladder and a colon resection but now has urinary retention and no output in her bladder. Alert and oriented.

## 2016-01-29 NOTE — ED Notes (Signed)
Gave patient a leg bag kit. She verbalized understanding of how to change bag and care for catheter.

## 2016-01-29 NOTE — ED Notes (Signed)
After leaving room, pt's spouse reported she urinated on the bed. Went to bedside, pt has urinated around the tube. Changed patients sheets and provided an incont pad.

## 2016-01-29 NOTE — ED Notes (Signed)
Notified Dr. Tamera Punt that patient is tearful and in pain. She will come see patient before any treatment performed.

## 2016-01-31 LAB — URINE CULTURE: SPECIAL REQUESTS: NORMAL

## 2016-02-07 NOTE — Assessment & Plan Note (Signed)
Left breast invasive mammary cancer 1:00 position: 3.1 x 2.9 x 3.6 cm tumor with multiple enlarged level [id number] left axillary lymph nodes largest measuring 4 cm per total congruent nor mass measures 10.2 cm ER 12%, PR 0%, Ki-67 100%, HER-2 negative ratio 1.53  Chemotherapy summary: neoadjuvant chemotherapy with dose dense Adriamycin and Cytoxan followed by Abraxane weekly 12 completed 08/18/2015 Left Lumpectomy 09/12/15: Path CR; 0/5 LN Negative  Pathology Review: Discussed the path report in detail and provided her a copy of this report. Patient is very happy about the Path CR.  Plan: 1. Radiation: Patient is a told radiation oncology that she does not want to do radiation therapy. I strongly urged her to reconsider this decision. Her significant risk is local relapse if she does not do radiation after lumpectomy. She is still not decided to undergo radiation. I instructed her to reconsider and let us know what the decision is. If she decides against radiation, and we will prescribe her antiestrogen therapy. 2. Followed by antiestrogen therapy with anastrozole x 5 years I discussed the risks and benefits of antiestrogen therapy and she appears to understand this very well.  Diarrhea and abdominal cramps: Previously treated with Flagyl for 7-10 days. Resolved now takes probiotics UTI: Treated previously with ciprofloxacin. Patient complains of burning with urination. Instructed to drink more water.  Neuropathy: Chemotherapy-induced  Return to clinic in 3 months for follow-up. She will call us and let us know of her decision so that we can call in a prescription for anastrozole.

## 2016-02-08 ENCOUNTER — Ambulatory Visit (HOSPITAL_BASED_OUTPATIENT_CLINIC_OR_DEPARTMENT_OTHER): Payer: Medicare Other | Admitting: Hematology and Oncology

## 2016-02-08 ENCOUNTER — Encounter: Payer: Self-pay | Admitting: Hematology and Oncology

## 2016-02-08 VITALS — BP 155/86 | HR 72 | Temp 98.0°F | Resp 18 | Wt 207.7 lb

## 2016-02-08 DIAGNOSIS — M25511 Pain in right shoulder: Secondary | ICD-10-CM

## 2016-02-08 DIAGNOSIS — G620 Drug-induced polyneuropathy: Secondary | ICD-10-CM | POA: Diagnosis not present

## 2016-02-08 DIAGNOSIS — C50412 Malignant neoplasm of upper-outer quadrant of left female breast: Secondary | ICD-10-CM | POA: Diagnosis not present

## 2016-02-08 NOTE — Progress Notes (Signed)
Unable to get in to exam room prior to MD.  No assessment performed.  

## 2016-02-08 NOTE — Progress Notes (Signed)
Patient Care Team: Midge Minium, MD as PCP - General (Family Medicine) Mackie Pai, PA-C as Physician Assistant (Physician Assistant) Michael Boston, MD as Consulting Physician (General Surgery) Franchot Gallo, MD as Consulting Physician (Urology) Milus Banister, MD as Attending Physician (Gastroenterology) Nicholas Lose, MD as Consulting Physician (Hematology and Oncology)  DIAGNOSIS: Breast cancer of upper-outer quadrant of left female breast [facility])   Staging form: Breast, AJCC 7th Edition     Pathologic stage from 09/28/2015: yT0, N0, cM0 - Signed by Thea Silversmith, MD on 09/28/2015   SUMMARY OF ONCOLOGIC HISTORY:   Breast cancer of upper-outer quadrant of left female breast ([place])   03/02/2015 Initial Diagnosis Left breast biopsy 1:00: Invasive mammary cancer, grade 3, 1 lymph node biopsy in the axilla positive, ER 12%, PR 0%, Ki-67 100%, HER-2 negative ratio 1.53   03/08/2015 Breast MRI Left breast 1:00 position: 3.6 cm mass with enlarged axillary lymph nodes multiple levels largest 4 cm, total mass measures 10.2 cm   03/30/2015 - 08/17/2015 Neo-Adjuvant Chemotherapy Adriamycin Cytoxan 4 followed by Abraxane weekly 12   08/21/2015 Breast MRI Complete imaging response to new adjuvant chemotherapy, no residual abnormal or asymmetric mass seen, lymph nodes completely resolved   09/12/2015 Surgery Left Lumpectomy: Path CR; 0/5 LN Negatives   11/08/2015 -  Anti-estrogen oral therapy Anastrozole 1 mg daily, stopped January 2017    CHIEF COMPLIANT: Follow-up on anastrozole  INTERVAL HISTORY: Cassandra Maldonado is a 70 year old with above-mentioned history of left breast cancer treated with neoadjuvant chemotherapy followed by lumpectomy. She had a pathologic complete response. She did not get radiation therapy by preference. She was started on anastrozole which caused her hot flashes as well as left shoulder pain. She stopped taking anastrozole and her pain has improved somewhat. 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 She is still recovering from that surgery.   REVIEW OF SYSTEMS:   Constitutional: Denies fevers, chills or abnormal weight loss Eyes: Denies blurriness of vision Ears, nose, mouth, throat, and face: Denies mucositis or sore throat Respiratory: Denies cough, dyspnea or wheezes Cardiovascular: Denies palpitation, chest discomfort Gastrointestinal:  Denies nausea, heartburn or change in bowel habits Skin: Denies abnormal skin rashes Lymphatics: Denies new lymphadenopathy or easy bruising Neurological:Denies numbness, tingling or new weaknesses Behavioral/Psych: Mood is stable, no new changes  Extremities: No lower extremity edema Breast:  denies any pain or lumps or nodules in either breasts All other systems were reviewed with the patient and are negative.  I have reviewed the past medical history, past surgical history, social history and family history with the patient and they are unchanged from previous note.  ALLERGIES:  has No Known Allergies.  MEDICATIONS:  Current Outpatient Prescriptions  Medication Sig Dispense Refill  . acetaminophen (TYLENOL) 500 MG tablet Take 500-1,000 mg by mouth every 6 (six) hours as needed for moderate pain (Pain).     Marland Kitchen anastrozole (ARIMIDEX) 1 MG tablet Take 1 tablet (1 mg total) by mouth daily. 30 tablet 3  . ciprofloxacin (CIPRO) 500 MG tablet Take 500 mg by mouth 2 (two) times daily. Reported on 01/29/2016  0  . oxyCODONE (OXY IR/ROXICODONE) 5 MG immediate release tablet Take 1-2 tablets (5-10 mg total) by mouth every 4 (four) hours as needed for moderate pain, severe pain or breakthrough pain. 40 tablet 0  . phenazopyridine (PYRIDIUM) 200 MG tablet Take 200 mg by mouth 3 (three) times daily as needed for pain.    . vitamin B-12 (CYANOCOBALAMIN) 100 MCG  tablet Take 100 mcg by mouth daily.     No current facility-administered medications for this visit.    PHYSICAL EXAMINATION: ECOG  PERFORMANCE STATUS: 1 - Symptomatic but completely ambulatory  Filed Vitals:   02/08/16 1016  BP: 155/86  Pulse: 72  Temp: 98 F (36.7 C)  Resp: 18   Filed Weights   02/08/16 1016  Weight: 207 lb 11.2 oz (94.212 kg)    GENERAL:alert, no distress and comfortable SKIN: skin color, texture, turgor are normal, no rashes or significant lesions EYES: normal, Conjunctiva are pink and non-injected, sclera clear OROPHARYNX:no exudate, no erythema and lips, buccal mucosa, and tongue normal  NECK: supple, thyroid normal size, non-tender, without nodularity LYMPH:  no palpable lymphadenopathy in the cervical, axillary or inguinal LUNGS: clear to auscultation and percussion with normal breathing effort HEART: regular rate & rhythm and no murmurs and no lower extremity edema ABDOMEN:abdomen soft, non-tender and normal bowel sounds MUSCULOSKELETAL:no cyanosis of digits and no clubbing  NEURO: alert & oriented x 3 with fluent speech, no focal motor/sensory deficits EXTREMITIES: No lower extremity edema   LABORATORY DATA:  I have reviewed the data as listed   Chemistry      Component Value Date/Time   NA 138 01/25/2016 0426   NA 140 08/17/2015 0902   K 4.0 01/25/2016 0426   K 3.6 08/17/2015 0902   CL 106 01/25/2016 0426   CO2 24 01/25/2016 0426   CO2 29 08/17/2015 0902   BUN 27* 01/25/2016 0426   BUN 14.3 08/17/2015 0902   CREATININE 1.23* 01/25/2016 0426   CREATININE 1.0 08/17/2015 0902      Component Value Date/Time   CALCIUM 8.5* 01/25/2016 0426   CALCIUM 10.0 08/17/2015 0902   ALKPHOS 76 09/07/2015 1430   ALKPHOS 97 08/17/2015 0902   AST 17 09/07/2015 1430   AST 22 08/17/2015 0902   ALT 12* 09/07/2015 1430   ALT 15 08/17/2015 0902   BILITOT 0.6 09/07/2015 1430   BILITOT 0.54 08/17/2015 0902       Lab Results  Component Value Date   WBC 9.0 01/25/2016   HGB 9.5* 01/25/2016   HCT 28.7* 01/25/2016   MCV 88.9 01/25/2016   PLT 211 01/25/2016   NEUTROABS 5.0  09/07/2015   ASSESSMENT & PLAN:  Breast cancer of upper-outer quadrant of left female breast Left breast invasive mammary cancer 1:00 position: 3.1 x 2.9 x 3.6 cm tumor with multiple enlarged level MCCXIII left axillary lymph nodes largest measuring 4 cm per total congruent nor mass measures 10.2 cm ER 12%, PR 0%, Ki-67 100%, HER-2 negative ratio 1.53  Chemotherapy summary: neoadjuvant chemotherapy with dose dense Adriamycin and Cytoxan followed by Abraxane weekly 12 completed 08/18/2015 Left Lumpectomy 09/12/15: Path CR; 0/5 LN Negative Refused radiation therapy  Current treatment:Antiestrogen therapy with anastrozole x 5 years Anastrozole toxicities: 1. Severe right shoulder pain after being on anastrozole for 2 months. She stopped taking it.  I instructed her that she should resume taking it in a few weeks. If she continues to have the shoulder pain then we can switch her to letrozole. She will call and let me know if she does restart.  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  Neuropathy: Chemotherapy-induced, Slowly improving with time. Return to clinic in 6 months for follow-up  No orders of the defined types were placed in this encounter.   The patient has a good understanding of the overall plan. she agrees with it. she will call with any problems  that may develop before the next visit here.   Rulon Eisenmenger, MD 02/08/2016

## 2016-02-12 ENCOUNTER — Telehealth: Payer: Self-pay | Admitting: Hematology and Oncology

## 2016-02-12 NOTE — Telephone Encounter (Signed)
Spoke with patient re 8/16 f/u °

## 2016-04-08 ENCOUNTER — Encounter: Payer: Medicare Other | Admitting: Family Medicine

## 2016-04-22 ENCOUNTER — Encounter: Payer: Self-pay | Admitting: Nurse Practitioner

## 2016-04-22 DIAGNOSIS — C50412 Malignant neoplasm of upper-outer quadrant of left female breast: Secondary | ICD-10-CM

## 2016-04-22 NOTE — Progress Notes (Signed)
The Survivorship Care Plan was mailed to Ms. Pheasant as she reported not being able to come in to the Survivorship Clinic for an in-person visit at this time. A letter was mailed to her outlining the purpose of the content of the care plan, as well as encouraging her to reach out to me with any questions or concerns.  My business card was included in the correspondence to the patient as well.  A copy of the care plan was also routed/faxed/mailed to Annye Asa, MD, the patient's PCP.  I will not be placing any follow-up appointments to the Survivorship Clinic for Ms. Mccannon, but I am happy to see her at any time in the future for any survivorship concerns that may arise. Thank you for allowing me to participate in her care!  Kenn File, [place] [phone]

## 2016-06-04 IMAGING — MR MR BREAST BILAT WO/W CM
8 of 13 series · 31 of 48 positions shown · IV contrast (20ml Multihance)
Comparison: Mammogram and ultrasound images of 03/02/2015 and prior
breast MRI dated 03/08/2015

CLINICAL DATA: Assess response neoadjuvant chemotherapy.
69-year-old patient diagnosed in February 2015 with high-grade mammary
carcinoma 1 o'clock position left breast and left axillary nodal
metastasis, following ultrasound-guided biopsy of the left breast
mass and one of several enlarged left axillary lymph nodes.

LABS:  None obtained
EXAM:
BILATERAL BREAST MRI WITH AND WITHOUT CONTRAST
TECHNIQUE: Multiplanar, multisequence MR images of both breasts were obtained
prior to and following the intravenous administration of 20 ml of
MultiHance.

[Series 2: T2 · axial · 3.0mm · 0.94mm/px · z∈[-80,+82]mm · 3 of 55 slices shown]
[im 1/55]
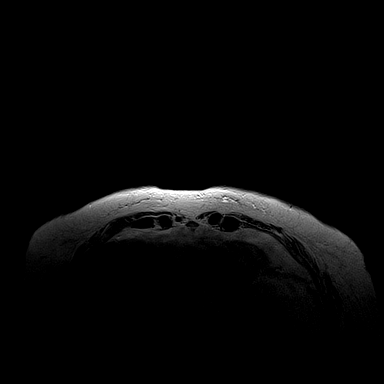
[im 28/55]
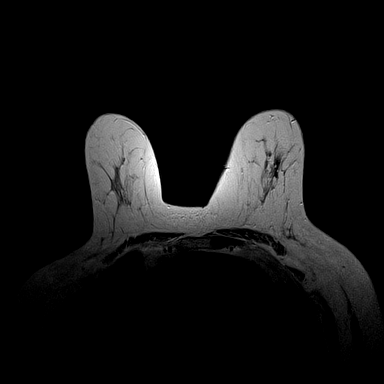
[im 55/55]
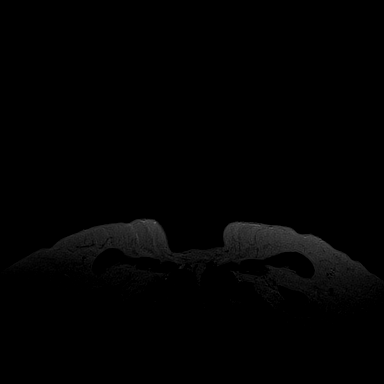

[Series 3: t2_tirm_tra ipat (a-p) · axial · 3.0mm · 0.70mm/px · z∈[-80,+82]mm · 2 of 55 slices shown]
[im 1/55]
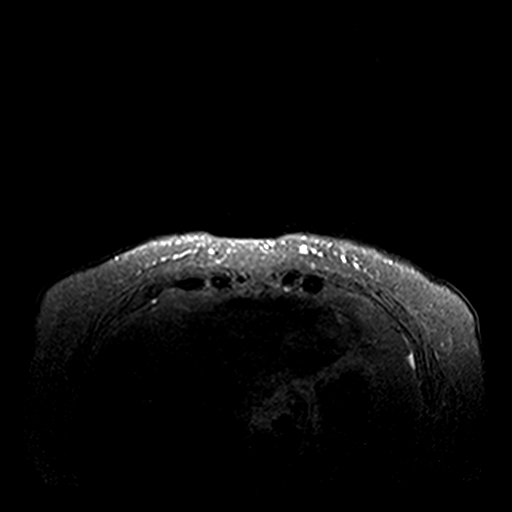
[im 55/55]
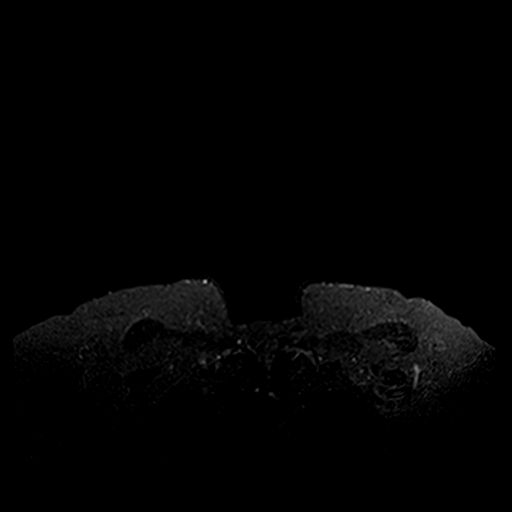

[Series 4: fl3d pre-cm no · axial · non-contrast · 1.2mm · 0.94mm/px · z∈[-81,+90]mm · 5 of 144 slices shown]
[im 1/144]
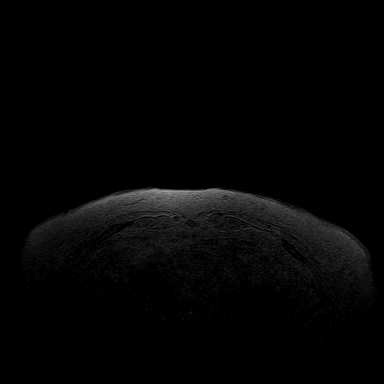
[im 36/144]
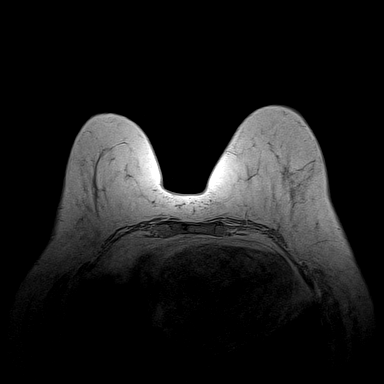
[im 72/144]
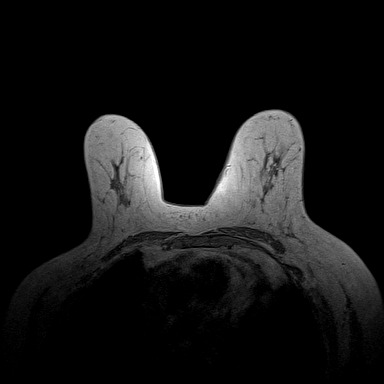
[im 108/144]
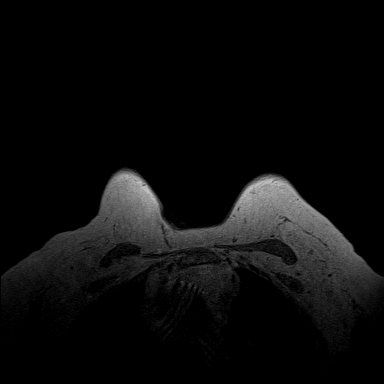
[im 144/144]
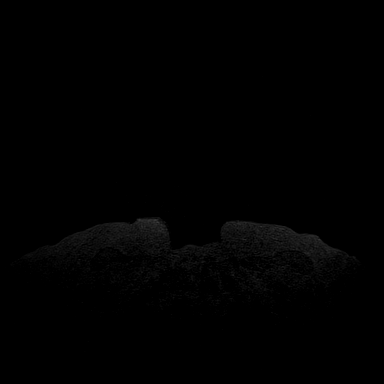

[Series 5: fl3d pre-cm · axial · non-contrast · 1.2mm · 0.94mm/px · z∈[-81,+90]mm · 5 of 144 slices shown]
[im 1/144]
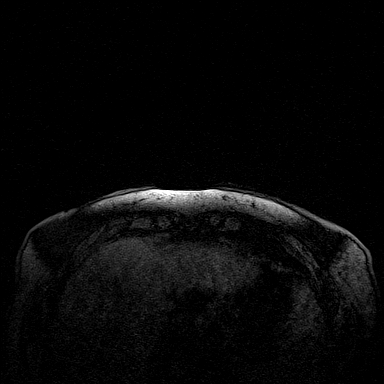
[im 36/144]
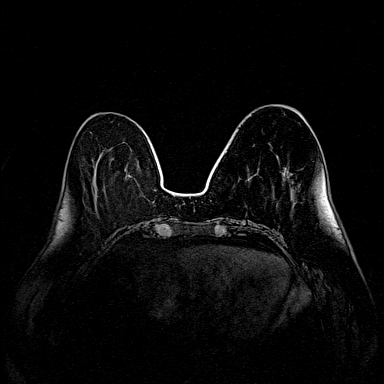
[im 72/144]
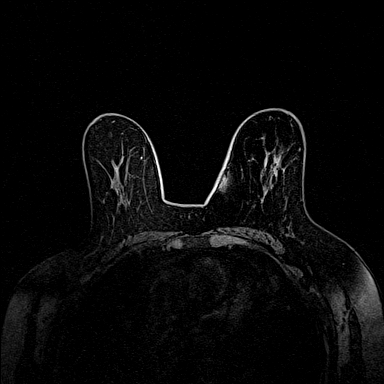
[im 108/144]
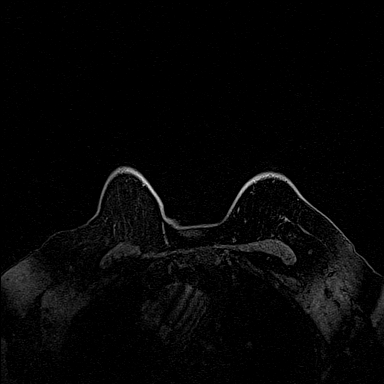
[im 144/144]
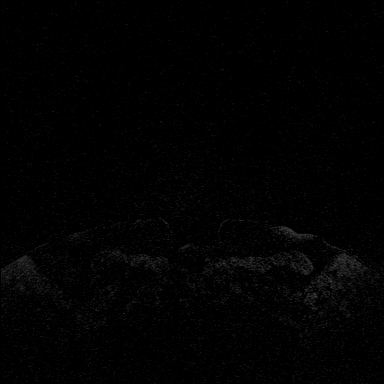

[Series 6: fl3d post-cm 20 · axial · 1.2mm · 0.94mm/px · z∈[-81,+90]mm · 5 of 144 slices shown (1 of 3)]
[im 1/144]
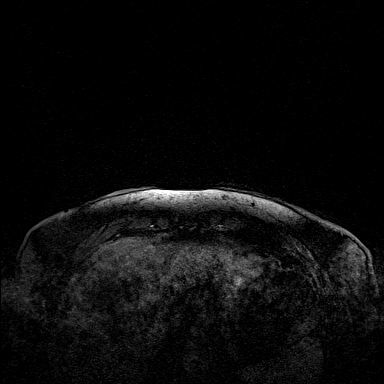
[im 36/144]
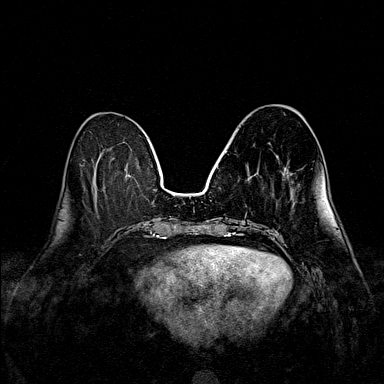
[im 72/144]
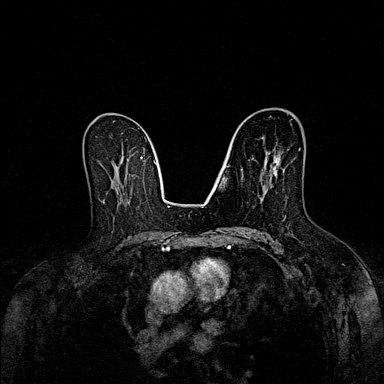
[im 108/144]
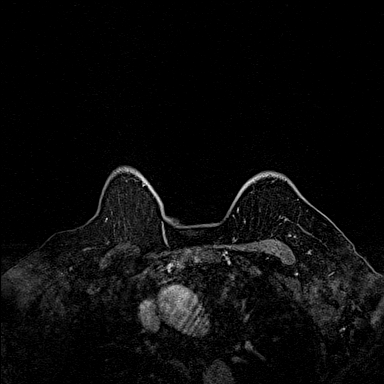
[im 144/144]
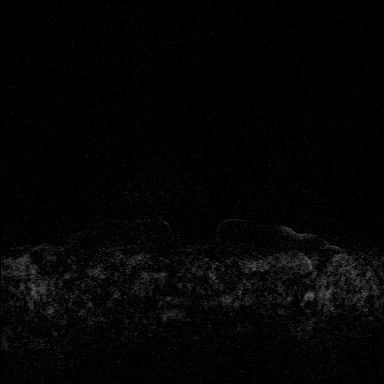

[Series 7: fl3d post-cm 20 · axial · 1.2mm · 0.94mm/px · z∈[-81,+90]mm · 5 of 144 slices shown (2 of 3)]
[im 1/144]
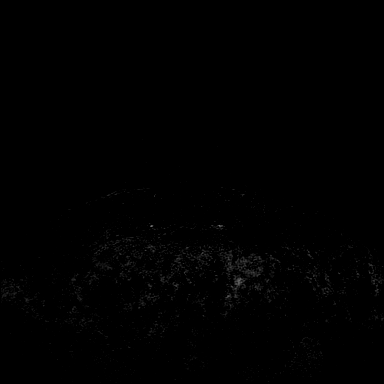
[im 36/144]
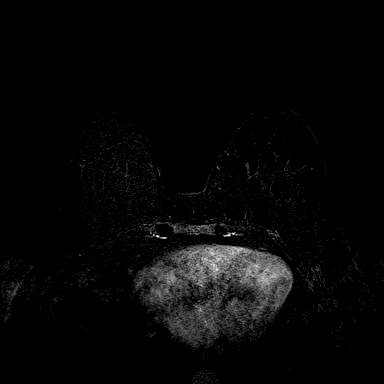
[im 72/144]
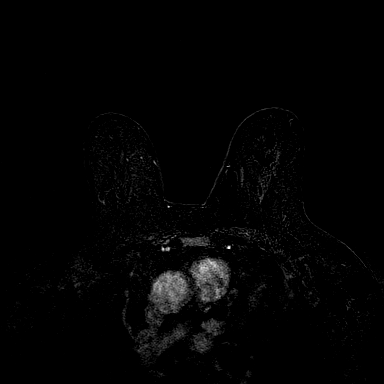
[im 108/144]
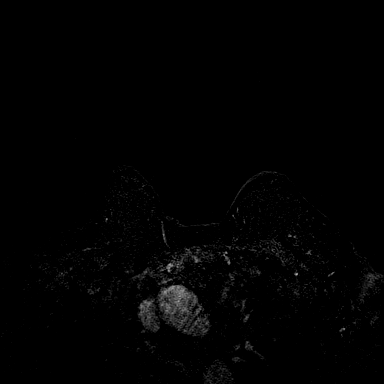
[im 144/144]
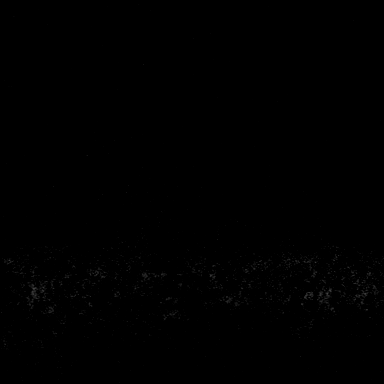

[Series 8: fl3d post-cm 20 · axial · 172.8mm · 0.94mm/px · 1 of 1 slices shown (3 of 3)]
[im 1/1]
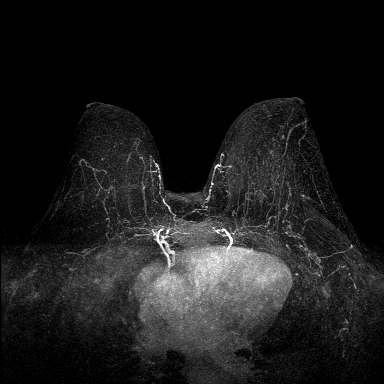

[Series 9: fl3d post-cm 3min · axial · 1.2mm · 0.94mm/px · z∈[-81,+90]mm · 5 of 144 slices shown]
[im 1/144]
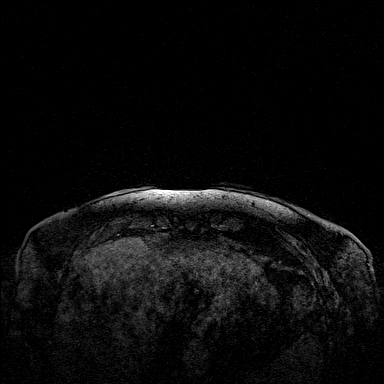
[im 36/144]
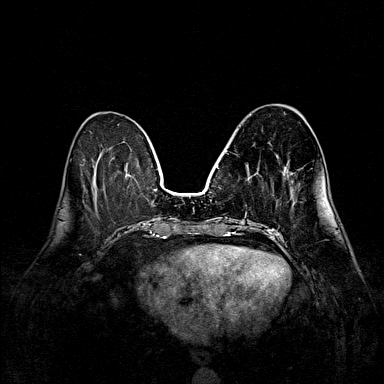
[im 72/144]
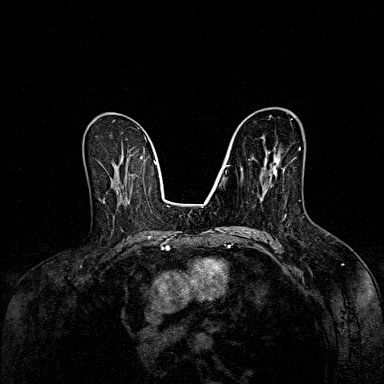
[im 108/144]
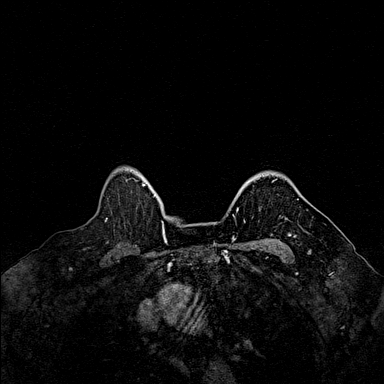
[im 144/144]
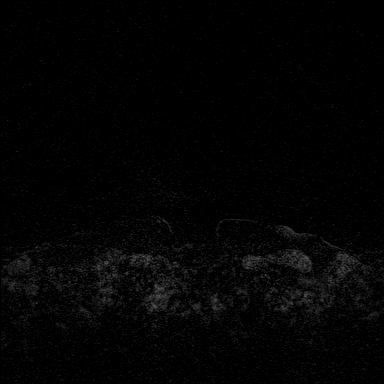

[31 of 48 positions shown; findings below may reference images not displayed]

THREE-DIMENSIONAL MR IMAGE RENDERING ON INDEPENDENT WORKSTATION:

Three-dimensional MR images were rendered by post-processing of the
original MR data on an independent workstation. The
three-dimensional MR images were interpreted, and findings are
reported in the following complete MRI report for this study. Three
dimensional images were evaluated at the independent [employer]
workstation
FINDINGS: Breast composition: b. Scattered fibroglandular tissue.

Background parenchymal enhancement: [place]

Right breast: No mass or abnormal enhancement.

Left breast: There as been a complete imaging response to
neoadjuvant chemotherapy. No residual abnormal or asymmetric
enhancement or mass is seen in the left breast. Biopsy clip is noted
in the middle third of the left breast at 1 o'clock position.

Lymph nodes: Interval resolution of enlarged left axillary lymph
nodes. Left axillary lymph nodes are visible, but are now within
normal limits for size and cortical thickness. No right axillary
lymphadenopathy. No internal mammary chain lymphadenopathy.

Ancillary findings:  None.
IMPRESSION: Significant improvement/complete imaging response following
neoadjuvant chemotherapy. No residual mass or abnormal enhancement
is seen in the left breast, and left axillary lymphadenopathy has
resolved.

RECOMMENDATION:
Continue treatment planning

BI-RADS CATEGORY  6: Known biopsy-proven malignancy.

## 2016-08-07 ENCOUNTER — Ambulatory Visit: Payer: Medicare Other | Admitting: Hematology and Oncology

## 2016-09-27 ENCOUNTER — Encounter: Payer: Self-pay | Admitting: Genetic Counselor

## 2016-10-16 ENCOUNTER — Encounter (HOSPITAL_COMMUNITY): Payer: Self-pay

## 2017-01-08 ENCOUNTER — Other Ambulatory Visit: Payer: Self-pay | Admitting: Nurse Practitioner

## 2017-02-04 ENCOUNTER — Encounter (HOSPITAL_COMMUNITY): Payer: Self-pay

## 2018-05-13 ENCOUNTER — Encounter: Payer: Self-pay | Admitting: General Practice

## 2019-03-15 ENCOUNTER — Ambulatory Visit: Payer: Medicare Other | Admitting: Podiatry

## 2023-06-13 ENCOUNTER — Ambulatory Visit: Payer: Medicare Other | Attending: Internal Medicine | Admitting: Internal Medicine

## 2023-06-13 VITALS — BP 142/98 | HR 77 | Ht 63.0 in | Wt 256.6 lb

## 2023-06-13 DIAGNOSIS — G4719 Other hypersomnia: Secondary | ICD-10-CM

## 2023-06-13 DIAGNOSIS — R0602 Shortness of breath: Secondary | ICD-10-CM

## 2023-06-13 DIAGNOSIS — R002 Palpitations: Secondary | ICD-10-CM | POA: Diagnosis not present

## 2023-06-13 DIAGNOSIS — Z9189 Other specified personal risk factors, not elsewhere classified: Secondary | ICD-10-CM

## 2023-06-13 DIAGNOSIS — R5383 Other fatigue: Secondary | ICD-10-CM

## 2023-06-13 DIAGNOSIS — R0683 Snoring: Secondary | ICD-10-CM

## 2023-06-13 DIAGNOSIS — I4510 Unspecified right bundle-branch block: Secondary | ICD-10-CM | POA: Diagnosis not present

## 2023-06-13 NOTE — Patient Instructions (Addendum)
Medication Instructions:  NO CHANGES  *If you need a refill on your cardiac medications before your next appointment, please call your pharmacy*    Testing/Procedures: Your physician has requested that you have an echocardiogram. Echocardiography is a painless test that uses sound waves to create images of your heart. It provides your doctor with information about the size and shape of your heart and how well your heart's chambers and valves are working. This procedure takes approximately one hour. There are no restrictions for this procedure. Please do NOT wear cologne, perfume, aftershave, or lotions (deodorant is allowed). Please arrive 15 minutes prior to your appointment time. This is done at [facility] -- [address] - [address] [place]  WatchPAT?  Is a FDA cleared portable home sleep study test that uses a watch and 3 points of contact to monitor 7 different channels, including your heart rate, oxygen saturations, body position, snoring, and chest motion.  The study is easy to use from the comfort of your own home and accurately detect sleep apnea.  Before bed, you attach the chest sensor, attached the sleep apnea bracelet to your nondominant hand, and attach the finger probe.  After the study, the raw data is downloaded from the watch and scored for apnea events.   For more information: [email or web address]  Patient Testing Instructions:  Do not put battery into the device until bedtime when you are ready to begin the test. Please call the support number if you need assistance after following the instructions below: 24 hour support line- [phone] or [facility] support at [phone] (option 2)  Download the IntelWatchPAT One" app through the google play store or App Store  Be sure to turn on or enable access to bluetooth in settlings on your smartphone/ device  Make sure no other bluetooth devices are on and within the vicinity of your smartphone/  device and WatchPAT watch during testing.  Make sure to leave your smart phone/ device plugged in and charging all night.  When ready for bed:  Follow the instructions step by step in the WatchPAT One App to activate the testing device. For additional instructions, including video instruction, visit the WatchPAT One video on Youtube. You can search for WatchPat One within Youtube (video is 4 minutes and 18 seconds) or enter: https://youtube/watch?v=BCce_vbiwxE Please note: You will be prompted to enter a Pin to connect via bluetooth when starting the test. The PIN will be assigned to you when you receive the test.  The device is disposable, but it recommended that you retain the device until you receive a call letting you know the study has been received and the results have been interpreted.  We will let you know if the study did not transmit to [place] properly after the test is completed. You do not need to call us to confirm the receipt of the test.  Please complete the test within 48 hours of receiving PIN.   Frequently Asked Questions:  What is Watch Dennie Bible one?  A single use fully disposable home sleep apnea testing device and will not need to be returned after completion.  What are the requirements to use WatchPAT one?  The be able to have a successful watchpat one sleep study, you should have your Watch pat one device, your smart phone, watch pat one app, your PIN number and Internet access What type of phone do I need?  You should have a smart phone that uses Android 5.1 and above or any Iphone with  IOS 10 and above How can I download the WatchPAT one app?  Based on your device type search for WatchPAT one app either in google play for android devices or APP store for Iphone's Where will I get my PIN for the study?  Your PIN will be provided by your physician's office. It is used for authentication and if you lose/forget your PIN, please reach out to your providers office.  I do not have  Internet at home. Can I do WatchPAT one study?  WatchPAT One needs Internet connection throughout the night to be able to transmit the sleep data. You can use your home/local internet or your cellular's data package. However, it is always recommended to use home/local Internet. It is estimated that between 20MB-30MB will be used with each study.However, the application will be looking for space in the phone to start the study.  What happens if I lose internet or bluetooth connection?  During the internet disconnection, your phone will not be able to transmit the sleep data. All the data, will be stored in your phone. As soon as the internet connection is back on, the phone will being sending the sleep data. During the bluetooth disconnection, WatchPAT one will not be able to to send the sleep data to your phone. Data will be kept in the [facility] one until two devices have bluetooth connection back on. As soon as the connection is back on, WatchPAT one will send the sleep data to the phone.  How long do I need to wear the WatchPAT one?  After you start the study, you should wear the device at least 6 hours.  How far should I keep my phone from the device?  During the night, your phone should be within 15 feet.  What happens if I leave the room for restroom or other reasons?  Leaving the room for any reason will not cause any problem. As soon as your get back to the room, both devices will reconnect and will continue to send the sleep data. Can I use my phone during the sleep study?  Yes, you can use your phone as usual during the study. But it is recommended to put your watchpat one on when you are ready to go to bed.  How will I get my study results?  A soon as you completed your study, your sleep data will be sent to the provider. They will then share the results with you when they are ready.     Follow-Up: At [facility], you and your health needs are our priority.  As part of our  continuing mission to provide you with exceptional heart care, we have created designated Provider Care Teams.  These Care Teams include your primary Cardiologist (physician) and Advanced Practice Providers (APPs -  Physician Assistants and Nurse Practitioners) who all work together to provide you with the care you need, when you need it.  We recommend signing up for the patient portal called "MyChart".  Sign up information is provided on this After Visit Summary.  MyChart is used to connect with patients for Virtual Visits (Telemedicine).  Patients are able to view lab/test results, encounter notes, upcoming appointments, etc.  Non-urgent messages can be sent to your provider as well.   To learn more about what you can do with MyChart, go to [email or web address].    Your next appointment:    1-2 months with Dr. Rennis Golden or NP/PA for follow up of testing

## 2023-06-13 NOTE — Progress Notes (Signed)
OFFICE CONSULT NOTE  Chief Complaint:  Palpitations, skipped heart beats  Primary Care Physician: Kaleen Mask, MD  HPI:  Cassandra Maldonado is a 77 y.o. female who is being seen today for the evaluation of palpitations at the request of Lance Bosch, NP. This is a pleasant 77 year old female whose husband is a patient of mine.  She was recently seen by her PCP for palpitations.  An EKG at their office showed a right bundle branch block and some PACs.  She was referred for further evaluation.  She denies any chest pain or worsening shortness of breath.  We discussed the etiology of right bundle branch block.  Of concern would be the risk of possible obstructive sleep apnea.  She has multiple risk factors including hypertension and obesity, increased neck thickness and is noted to snore loudly.  She reports that on restorative sleep fairly often.  She reports her palpitations have improved somewhat up until this past week and then they started up again.  She tries avoid caffeine but does do some coffee in the evening as well as the morning.  PMHx:  Past Medical History:  Diagnosis Date   Acute bronchitis 03/22/2015   Breast cancer, left breast (HCC)    Cardiac arrhythmia due to congenital heart disease    Chest pain 10/04/2014   Chest pressure    Chicken pox    Eustachian tube dysfunction 10/21/2014   Family history of ovarian cancer    Full dentures    Hypercholesterolemia    Patient denies   Obesity    Palpitations    Trapezius muscle spasm 10/21/2014   Wears glasses     Past Surgical History:  Procedure Laterality Date   CESAREAN SECTION  1980   CYSTOSCOPY WITH STENT PLACEMENT Bilateral 01/24/2016   Procedure: CYSTO WITH BLADDER BIOPSY, PLACEMENT OF BILATERAL URETERAL CATHETERS;  Surgeon: Marcine Matar, MD;  Location: [facility];  Service: Urology;  Laterality: Bilateral;   HEMORROIDECTOMY     PORT-A-CATH REMOVAL Right 09/12/2015   Procedure: REMOVAL PORT-A-CATH;   Surgeon: Harriette Bouillon, MD;  Location: [facility];  Service: General;  Laterality: Right;   PORTACATH PLACEMENT N/A 03/16/2015   Procedure: INSERTION PORT-A-CATH;  Surgeon: Emelia Loron, MD;  Location: [facility];  Service: General;  Laterality: N/A;   RADIOACTIVE SEED GUIDED PARTIAL MASTECTOMY WITH AXILLARY SENTINEL LYMPH NODE BIOPSY Left 09/12/2015   Procedure: LEFT BREAST RADIOACTIVE SEED LOCALIZED PARTILA MASTECTOMY WITH SENTINEL LYMPH NODE MAPPING AND PORT REMOVAL;  Surgeon: Harriette Bouillon, MD;  Location: [facility];  Service: General;  Laterality: Left;   RETINAL DETACHMENT SURGERY  2006   rt   VAGINAL HYSTERECTOMY     XI ROBOTIC ASSISTED LOWER ANTERIOR RESECTION N/A 01/24/2016   Procedure: XI ROBOTIC ASSISTED sigmoid colectomy REPAIR OF COLOVESICAL FISTULA. PRIMARY REPAIR OF VENTRAL WALL HERNIA;  Surgeon: Karie Soda, MD;  Location: [facility];  Service: General;  Laterality: N/A;    FAMHx:  Family History  Problem Relation Age of Onset   Diabetes Brother    Arthritis Maternal Grandmother    Lymphoma Mother        dx in her late 38s   Liver cancer Father 86   Ovarian cancer Sister 59       primary peritoneal cancer   Colon cancer Neg Hx     SOCHx:   reports that she has never smoked. She has never used smokeless tobacco. She reports that she does not drink alcohol  and does not use drugs.  ALLERGIES:  No Known Allergies  ROS: Pertinent items noted in HPI and remainder of comprehensive ROS otherwise negative.  HOME MEDS: Current Outpatient Medications on File Prior to Visit  Medication Sig Dispense Refill   acetaminophen (TYLENOL) 500 MG tablet Take 500-1,000 mg by mouth every 6 (six) hours as needed for moderate pain (Pain).      lisinopril (ZESTRIL) 10 MG tablet Take 10 mg by mouth daily.     anastrozole (ARIMIDEX) 1 MG tablet Take 1 tablet (1 mg total) by mouth daily. 30 tablet 3   ciprofloxacin (CIPRO) 500 MG tablet Take 500  mg by mouth 2 (two) times daily. Reported on 01/29/2016  0   oxyCODONE (OXY IR/ROXICODONE) 5 MG immediate release tablet Take 1-2 tablets (5-10 mg total) by mouth every 4 (four) hours as needed for moderate pain, severe pain or breakthrough pain. 40 tablet 0   phenazopyridine (PYRIDIUM) 200 MG tablet Take 200 mg by mouth 3 (three) times daily as needed for pain.     vitamin B-12 (CYANOCOBALAMIN) 100 MCG tablet Take 100 mcg by mouth daily.     No current facility-administered medications on file prior to visit.    LABS/IMAGING: No results found for this or any previous visit (from the past 48 hour(s)). No results found.  LIPID PANEL:    Component Value Date/Time   CHOL 171 02/23/2014 1436   TRIG 166.0 (H) 02/23/2014 1436   HDL 27.70 (L) 02/23/2014 1436   CHOLHDL 6 02/23/2014 1436   VLDL 33.2 02/23/2014 1436   LDLCALC 110 (H) 02/23/2014 1436    WEIGHTS: Wt Readings from Last 3 Encounters:  06/13/23 256 lb 9.6 oz (116.4 kg)  02/08/16 207 lb 11.2 oz (94.2 kg)  01/27/16 209 lb 10.5 oz (95.1 kg)    VITALS: BP (!) 142/98 (BP Location: Left Arm, Patient Position: Sitting, Cuff Size: Large)   Pulse 77   Ht 5\' 3"  (1.6 m)   Wt 256 lb 9.6 oz (116.4 kg)   SpO2 98%   BMI 45.45 kg/m   EXAM: General appearance: alert, no distress, and morbidly obese Neck: no carotid bruit, no JVD, and thyroid not enlarged, symmetric, no tenderness/mass/nodules Lungs: clear to auscultation bilaterally Heart: regular rate and rhythm, S1, S2 normal, and systolic murmur: early systolic 2/6, blowing at 2nd right intercostal space Abdomen: soft, non-tender; bowel sounds normal; no masses,  no organomegaly and obese Extremities: extremities normal, atraumatic, no cyanosis or edema, varicose veins noted, and venous stasis dermatitis noted Pulses: 2+ and symmetric Skin: Skin color, texture, turgor normal. No rashes or lesions Neurologic: Grossly normal Psych: Pleasant  EKG: EKG  Interpretation  Date/Time:  Friday June 13 2023 08:37:02 EDT Ventricular Rate:  77 PR Interval:  174 QRS Duration: 122 QT Interval:  410 QTC Calculation: 463 R Axis:   49 Text Interpretation: Normal sinus rhythm Right bundle branch block When compared with ECG of 19-Jan-2016 09:13, Right bundle branch block is now Present Compared to previous tracing a RBBB is now present Confirmed by Zoila Shutter [phone]) on 06/13/2023 9:03:23 AM   ASSESSMENT: Palpitations - PAC's on EKG Newly identified RBBB Possible OSA - STOP-BANG score of 6 Hypertension  PLAN: 1.   Ms. Brabson has had recent palpitations and was noted to have some PACs on her EKG at the primary care provider's office.  Today the EKG shows sinus rhythm however there is a newly identified right bundle branch block.  Etiology is typically pulmonary for this  and I would suspect undiagnosed sleep apnea.  She has multiple risk factors for this including hypertension, obesity, thick neck and has known loud snoring and unrestful sleep.  Will order an IT MAR home study.  In addition we will get echocardiogram as she does have a murmur and to look for structural changes in her heart related to right bundle branch block.  Plan follow-up with me afterwards.  Thanks again for the kind referral.  Chrystie Nose, MD, [facility]  [place]  [facility]  Medical Director of the [facility] &  Cardiovascular Risk Reduction Clinic Diplomate of the American Board of Clinical Lipidology Attending Cardiologist  Direct Dial: [phone]  Fax: [phone]  Website:  [email or web address] Nicely Dorell Gatlin 06/13/2023, 8:42 AM

## 2023-07-14 ENCOUNTER — Ambulatory Visit (HOSPITAL_COMMUNITY): Payer: Medicare Other | Attending: Internal Medicine

## 2023-07-14 DIAGNOSIS — R5383 Other fatigue: Secondary | ICD-10-CM | POA: Insufficient documentation

## 2023-07-14 DIAGNOSIS — I4510 Unspecified right bundle-branch block: Secondary | ICD-10-CM | POA: Diagnosis not present

## 2023-07-14 DIAGNOSIS — G4719 Other hypersomnia: Secondary | ICD-10-CM | POA: Diagnosis present

## 2023-07-14 DIAGNOSIS — R0683 Snoring: Secondary | ICD-10-CM

## 2023-07-14 DIAGNOSIS — R002 Palpitations: Secondary | ICD-10-CM

## 2023-07-14 DIAGNOSIS — R0602 Shortness of breath: Secondary | ICD-10-CM | POA: Diagnosis not present

## 2023-07-14 LAB — ECHOCARDIOGRAM COMPLETE
Area-P 1/2: 2.19 cm2
S' Lateral: 3.3 cm

## 2023-07-17 NOTE — Progress Notes (Signed)
Cardiology Clinic Note   Date: 07/18/2023 ID: Maldonado, Cassandra 1946/03/24, MRN [id number]  Primary Cardiologist:  Chrystie Nose, MD  Patient Profile    Cassandra Maldonado is a 77 y.o. female who presents to the clinic today for routine follow up.     Past medical history significant for: Palpitations. Echo 07/14/2023: EF 60 to 65%.  Grade 1 DD.  Normal RV function.  Mild MR.  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 Hypertension. Breast cancer-left breast.     History of Present Illness    Cassandra Maldonado was first evaluated by Dr. Rennis Golden on 06/13/2023 for palpitations at the request of Cassandra Bosch, NP.  EKG performed by PCP showed RBBB and some PACs.  It was felt that undiagnosed sleep apnea was the cause of new RBBB.  She had risk factors including hypertension, obesity, thick neck, mild snoring, and unrestful sleep.  Home sleep study was ordered.  Today, patient reports she has not done her home sleep study yet, as she has not received the code. It appears insurance approval is still pending. She reports she is otherwise doing okay. Patient denies shortness of breath or dyspnea on exertion. No chest pain, pressure, or tightness. Denies lower extremity edema, orthopnea, or PND. She reports one episode of palpitations one night 3-4 weeks ago. She is unsure if she was wakened by the palpitations or if she got up to use the bathroom and noticed it. Symptoms described as heart racing. She checked her BP and her heart rate was >100. It lasted for 30-45 minutes and resolved on its own. She denies any episodes since. She reports occasional epigastric pain resolved with Tums. Suggested she keep a diary of symptoms and what she ate prior to occurrence so she can pinpoint triggers. She does not follow a regular exercise routine.     ROS: All other systems reviewed and are otherwise negative except as noted in History of Present Illness.  Studies Reviewed     No EKG ordered today.    Risk Assessment/Calculations          STOP-Bang Score:  6       Physical Exam    VS:  BP 128/80 (BP Location: Left Arm, Patient Position: Sitting, Cuff Size: Normal)   Pulse 70   Ht 5\' 4"  (1.626 m)   Wt 254 lb 6.4 oz (115.4 kg)   SpO2 97%   BMI 43.67 kg/m  , BMI Body mass index is 43.67 kg/m.  GEN: Well nourished, well developed, in no acute distress. Neck: No JVD or carotid bruits. Cardiac:  RRR. No murmurs. No rubs or gallops.   Respiratory:  Respirations regular and unlabored. Clear to auscultation without rales, wheezing or rhonchi. GI: Soft, nontender, nondistended. Extremities: Radials/DP/PT 2+ and equal bilaterally. No clubbing or cyanosis. No edema.  Skin: Warm and dry, no rash. Neuro: Strength intact.  Assessment & Plan    Palpitations.  Patient was noted to have PACs on past EKG. Patient reports one episode of palpitations one night 3-4 weeks ago. She is unsure if she was wakened by the palpitations or if she got up to use the bathroom and noticed it. Symptoms described as heart racing. She checked her BP and her heart rate was >100. It lasted for 30-45 minutes and resolved on its own. She denies any episodes since. I do not think Zio monitor is indicated for this isolated episode and patient agrees. She is instructed to contact the office if she develops  more frequent episodes.  RBBB.  Felt to be secondary to undiagnosed sleep apnea.  Patient has not undergone home sleep study yet. Prior to leaving the office insurance approved her study. She was provided with information to undergo study.  Hypertension: BP today 128/80. Patient denies headaches, dizziness or vision changes. Continue lisinopril.  Disposition: Return in 6 months or sooner as needed.          Signed, Etta Grandchild. Luxe Cuadros, DNP, NP-C

## 2023-07-18 ENCOUNTER — Ambulatory Visit: Payer: Medicare Other | Attending: Internal Medicine

## 2023-07-18 ENCOUNTER — Ambulatory Visit: Payer: Medicare Other | Attending: Student | Admitting: Student

## 2023-07-18 ENCOUNTER — Telehealth: Payer: Self-pay | Admitting: *Deleted

## 2023-07-18 ENCOUNTER — Encounter (INDEPENDENT_AMBULATORY_CARE_PROVIDER_SITE_OTHER): Payer: Medicare Other | Admitting: Cardiology

## 2023-07-18 ENCOUNTER — Encounter: Payer: Self-pay | Admitting: Student

## 2023-07-18 VITALS — BP 128/80 | HR 70 | Ht 64.0 in | Wt 254.4 lb

## 2023-07-18 DIAGNOSIS — I10 Essential (primary) hypertension: Secondary | ICD-10-CM

## 2023-07-18 DIAGNOSIS — R002 Palpitations: Secondary | ICD-10-CM | POA: Diagnosis not present

## 2023-07-18 DIAGNOSIS — R0602 Shortness of breath: Secondary | ICD-10-CM

## 2023-07-18 DIAGNOSIS — G4719 Other hypersomnia: Secondary | ICD-10-CM

## 2023-07-18 DIAGNOSIS — G4733 Obstructive sleep apnea (adult) (pediatric): Secondary | ICD-10-CM

## 2023-07-18 DIAGNOSIS — I4510 Unspecified right bundle-branch block: Secondary | ICD-10-CM

## 2023-07-18 DIAGNOSIS — R0683 Snoring: Secondary | ICD-10-CM

## 2023-07-18 DIAGNOSIS — R5383 Other fatigue: Secondary | ICD-10-CM

## 2023-07-18 NOTE — Telephone Encounter (Signed)
Prior Authorization for ITAMAR sent to [employer] via web portal. Tracking Number . do not require Pre-Authorization by [employer] 

## 2023-07-18 NOTE — Patient Instructions (Signed)
Medication Instructions:  No changes *If you need a refill on your cardiac medications before your next appointment, please call your pharmacy*   Lab Work: none If you have labs (blood work) drawn today and your tests are completely normal, you will receive your results only by: MyChart Message (if you have MyChart) OR A paper copy in the mail If you have any lab test that is abnormal or we need to change your treatment, we will call you to review the results.   Testing/Procedures: None   Follow-Up: At [facility], you and your health needs are our priority.  As part of our continuing mission to provide you with exceptional heart care, we have created designated Provider Care Teams.  These Care Teams include your primary Cardiologist (physician) and Advanced Practice Providers (APPs -  Physician Assistants and Nurse Practitioners) who all work together to provide you with the care you need, when you need it.  We recommend signing up for the patient portal called "MyChart".  Sign up information is provided on this After Visit Summary.  MyChart is used to connect with patients for Virtual Visits (Telemedicine).  Patients are able to view lab/test results, encounter notes, upcoming appointments, etc.  Non-urgent messages can be sent to your provider as well.   To learn more about what you can do with MyChart, go to [email or web address].    Your next appointment:   6 month(s)  Provider:   Chrystie Nose, MD

## 2023-07-21 NOTE — Telephone Encounter (Signed)
**Note De-Identified Arthella Headings Obfuscation** The pt was provided her Itamar-HST Pin # while at her office visit on 7/26.

## 2023-07-21 NOTE — Telephone Encounter (Signed)
**Note De-Identified Cassandra Maldonado Obfuscation** Unclear if the pt was given a Itamar-HST at her office visit on Friday 7/26. Forwarding back to Aneth for clarification.

## 2023-07-21 NOTE — Procedures (Signed)
SLEEP STUDY REPORT Patient Information Study Date: 07/18/2023 Patient Name: Cassandra Maldonado Patient ID: [id number] Birth Date: 1945/12/31 Age: 77 Gender: Female BMI: 45.3 (W=256 lb, H=5' 3'') Referring Physician: K. Italy Hilty, MD  TEST DESCRIPTION: Home sleep apnea testing was completed using the WatchPat, a Type 1 device, utilizing peripheral arterial tonometry (PAT), chest movement, actigraphy, pulse oximetry, pulse rate, body position and snore.  AHI was calculated with apnea and hypopnea using valid sleep time as the denominator. RDI includes apneas, hypopneas, and RERAs.  The data acquired and the scoring of sleep and all associated events were performed in accordance with the recommended standards and specifications as outlined in the AASM Manual for the Scoring of Sleep and Associated Events 2.2.0 (2015).  FINDINGS:  1.  Severe Obstructive Sleep Apnea with AHI 61.4/hr.   2.  No Central Sleep Apnea with pAHIc 0.7/hr.  3.  Oxygen desaturations as low as 82%.  4.  Severe snoring was present. O2 sats were < 88% for 16.2 min.  5.  Total sleep time was 6 hrs and 56 min.  6.  20.7% of total sleep time was spent in REM sleep.   7.  Shortened sleep onset latency at 6 min.   8.  Shortened REM sleep onset latency at 69 min.   9.  Total awakenings were 6 .  10. Arrhythmia detection: None  DIAGNOSIS:   Severe Obstructive Sleep Apnea (G47.33) Nocturnal Hypoxemia   RECOMMENDATIONS:   1.  Clinical correlation of these findings is necessary.  The decision to treat obstructive sleep apnea (OSA) is usually based on the presence of apnea symptoms or the presence of associated medical conditions such as Hypertension, Congestive Heart Failure, Atrial Fibrillation or Obesity.  The most common symptoms of OSA are snoring, gasping for breath while sleeping, daytime sleepiness and fatigue.   2.  Initiating apnea therapy is recommended given the presence of symptoms and/or associated  conditions. Recommend proceeding with one of the following:     a.  Auto-CPAP therapy with a pressure range of 5-20cm H2O.     b.  An oral appliance (OA) that can be obtained from certain dentists with expertise in sleep medicine.  These are primarily of use in non-obese patients with mild and moderate disease.     c.  An ENT consultation which may be useful to look for specific causes of obstruction and possible treatment options.     d.  If patient is intolerant to PAP therapy, consider referral to ENT for evaluation for hypoglossal nerve stimulator.   3.  Close follow-up is necessary to ensure success with CPAP or oral appliance therapy for maximum benefit.  4.  A follow-up oximetry study on CPAP is recommended to assess the adequacy of therapy and determine the need for supplemental oxygen or the potential need for Bi-level therapy.  An arterial blood gas to determine the adequacy of baseline ventilation and oxygenation should also be considered.  5.  Healthy sleep recommendations include:  adequate nightly sleep (normal 7-9 hrs/night), avoidance of caffeine after noon and alcohol near bedtime, and maintaining a sleep environment that is cool, dark and quiet.  6.  Weight loss for overweight patients is recommended.  Even modest amounts of weight loss can significantly improve the severity of sleep apnea.  7.  Snoring recommendations include:  weight loss where appropriate, side sleeping, and avoidance of alcohol before bed.  8.  Operation of motor vehicle should be avoided when sleepy.  Signature:  Armanda Magic, MD; [facility]; Diplomat, American Board of Sleep Medicine Electronically Signed: 07/21/2023 8:36:15 AM

## 2023-07-21 NOTE — Telephone Encounter (Signed)
The Itamar device was given to the patient on 06/13/23 at her OV with Dr. Rennis Golden.  When patient returned to office on 7/26 she had never completed the test as she had not been given a PIN.  Coralee North was contacted by Marcelle Smiling to see if she could help.  Coralee North placed a authorization via email and was able to get authorization while the patient was still in the office.  Patient was given the PIN and completed the study on 07/18/23 which has been read by Dr. Mayford Knife.

## 2023-07-21 NOTE — Telephone Encounter (Signed)
Hey Larita Fife I was off 7/26.I did not send her a pin #.

## 2023-07-30 ENCOUNTER — Telehealth: Payer: Self-pay

## 2023-07-30 DIAGNOSIS — G4733 Obstructive sleep apnea (adult) (pediatric): Secondary | ICD-10-CM

## 2023-07-30 NOTE — Telephone Encounter (Signed)
Left VM, per DPR, with callback number for sleep study results and recommendations.

## 2023-07-30 NOTE — Telephone Encounter (Signed)
Patient notified of sleep study results and recommendations. All questions (if any) were answered. Patient verbalized understanding. CPAP Titration ordered today 07/30/23.

## 2023-07-30 NOTE — Telephone Encounter (Signed)
-----   Message from Armanda Magic sent at 07/21/2023  8:38 AM EDT ----- Please let patient know that they have sleep apnea.  Recommend therapeutic CPAP titration for treatment of patient's sleep disordered breathing.  If unable to perform an in lab titration then initiate ResMed auto CPAP from 4 to 15cm H2O with heated humidity and mask of choice and overnight pulse ox on CPAP.

## 2023-10-27 ENCOUNTER — Telehealth: Payer: Self-pay

## 2023-10-27 NOTE — Telephone Encounter (Signed)
**Note De-Identified Cassandra Maldonado Obfuscation** Per the [employer] Provider Portal: The following solutions for the service date entered do not require Pre-Authorization by [employer]. 16109 Sleep monitoring of patient (6 years or older) in sleep lab with continued pressured respiratory assistance by mask or breathing tube   Pre-Authorization is not Required

## 2024-02-22 ENCOUNTER — Ambulatory Visit
